# Patient Record
Sex: Female | Born: 1980 | Hispanic: Yes | Marital: Single
Health system: Western US, Academic
[De-identification: ages and names within clinical notes are randomized; demographics above are authoritative.]

## PROBLEM LIST (undated history)

## (undated) ENCOUNTER — Emergency Department (HOSPITAL_COMMUNITY): Admission: EM | Payer: BC Managed Care – PPO | Source: Home / Self Care

## (undated) DIAGNOSIS — E039 Hypothyroidism, unspecified: Secondary | ICD-10-CM

## (undated) DIAGNOSIS — E282 Polycystic ovarian syndrome: Secondary | ICD-10-CM

## (undated) DIAGNOSIS — K219 Gastro-esophageal reflux disease without esophagitis: Secondary | ICD-10-CM

## (undated) DIAGNOSIS — T7840XA Allergy, unspecified, initial encounter: Secondary | ICD-10-CM

## (undated) DIAGNOSIS — K760 Fatty (change of) liver, not elsewhere classified: Secondary | ICD-10-CM

## (undated) DIAGNOSIS — F32A Depression, unspecified: Secondary | ICD-10-CM

## (undated) DIAGNOSIS — D649 Anemia, unspecified: Secondary | ICD-10-CM

## (undated) DIAGNOSIS — F419 Anxiety disorder, unspecified: Secondary | ICD-10-CM

## (undated) DIAGNOSIS — N189 Chronic kidney disease, unspecified: Secondary | ICD-10-CM

## (undated) DIAGNOSIS — E669 Obesity, unspecified: Secondary | ICD-10-CM

## (undated) DIAGNOSIS — R519 Headache, unspecified: Secondary | ICD-10-CM

## (undated) DIAGNOSIS — N809 Endometriosis, unspecified: Secondary | ICD-10-CM

## (undated) DIAGNOSIS — I10 Essential (primary) hypertension: Secondary | ICD-10-CM

## (undated) DIAGNOSIS — E119 Type 2 diabetes mellitus without complications: Secondary | ICD-10-CM

## (undated) HISTORY — DX: Obesity, unspecified: E66.9

## (undated) HISTORY — PX: OTHER SURGICAL HISTORY: SHX169

## (undated) HISTORY — PX: LAPAROSCOPY: SHX197

## (undated) HISTORY — PX: COLON SURGERY: SHX602

## (undated) HISTORY — DX: Chronic kidney disease, unspecified: N18.9

## (undated) HISTORY — PX: NASAL SEPTUM SURGERY: SHX37

## (undated) HISTORY — DX: Allergy, unspecified, initial encounter: T78.40XA

## (undated) HISTORY — DX: Gastro-esophageal reflux disease without esophagitis: K21.9

## (undated) HISTORY — DX: Anemia, unspecified: D64.9

## (undated) HISTORY — DX: Fatty (change of) liver, not elsewhere classified: K76.0

## (undated) HISTORY — DX: Depression, unspecified: F32.A

## (undated) HISTORY — DX: Type 2 diabetes mellitus without complications: E11.9

## (undated) HISTORY — DX: Essential (primary) hypertension: I10

## (undated) HISTORY — DX: Polycystic ovarian syndrome: E28.2

---

## 2002-09-04 ENCOUNTER — Emergency Department: Admit: 2002-09-04 | Payer: Self-pay | Source: Emergency Department

## 2007-02-26 ENCOUNTER — Emergency Department: Admit: 2007-02-26 | Payer: Self-pay | Source: Emergency Department | Admitting: Emergency Medicine

## 2007-02-26 LAB — BASIC METABOLIC PANEL
BUN: 9 mg/dL (ref 8–20)
CO2: 26 mEq/L (ref 21–30)
Calcium: 8.7 mg/dL (ref 8.6–10.2)
Chloride: 107 mEq/L (ref 98–107)
Creatinine: 0.7 mg/dL (ref 0.6–1.5)
Glucose: 96 mg/dL (ref 70–100)
Potassium: 4 mEq/L (ref 3.6–5.0)
Sodium: 141 mEq/L (ref 136–146)

## 2007-02-26 LAB — GFR

## 2007-02-26 LAB — CBC WITH AUTO DIFFERENTIAL CERNER
Basophils Absolute: 0 /mm3 (ref 0.0–0.2)
Basophils: 1 % (ref 0–2)
Eosinophils Absolute: 0.1 /mm3 (ref 0.0–0.7)
Eosinophils: 2 % (ref 0–5)
Granulocytes Absolute: 3 /mm3 (ref 1.8–8.1)
Hematocrit: 32.5 % — ABNORMAL LOW (ref 37.0–47.0)
Hgb: 10.9 G/DL — ABNORMAL LOW (ref 12.0–16.0)
Lymphocytes Absolute: 3.6 /mm3 (ref 0.5–4.4)
Lymphocytes: 48 % — ABNORMAL HIGH (ref 15–41)
MCH: 28.2 PG (ref 28.0–32.0)
MCHC: 33.7 G/DL (ref 32.0–36.0)
MCV: 83.8 FL (ref 80.0–100.0)
MPV: 9.1 FL (ref 7.4–10.4)
Monocytes Absolute: 0.6 /mm3 (ref 0.0–1.2)
Monocytes: 9 % (ref 0–11)
Neutrophils %: 41 % — ABNORMAL LOW (ref 52–75)
Platelets: 234 /mm3 (ref 140–400)
RBC: 3.88 /mm3 — ABNORMAL LOW (ref 4.20–5.40)
RDW: 18.2 % — ABNORMAL HIGH (ref 11.5–15.0)
WBC: 7.4 /mm3 (ref 3.5–10.8)

## 2007-02-26 LAB — D-DIMER - SOFT: D-Dimer: 129 ng FEU

## 2007-07-22 DIAGNOSIS — K602 Anal fissure, unspecified: Secondary | ICD-10-CM

## 2007-07-22 HISTORY — DX: Anal fissure, unspecified: K60.2

## 2008-07-24 ENCOUNTER — Emergency Department: Admit: 2008-07-24 | Payer: Self-pay | Source: Emergency Department | Admitting: Emergency Medicine

## 2008-12-07 ENCOUNTER — Emergency Department: Admit: 2008-12-07 | Payer: Self-pay | Source: Emergency Department | Admitting: Emergency Medicine

## 2008-12-07 LAB — URINALYSIS
Bilirubin, UA: NEGATIVE
Blood, UA: NEGATIVE
Glucose, UA: NEGATIVE
Ketones UA: NEGATIVE
Leukocyte Esterase, UA: NEGATIVE
Nitrite, UA: NEGATIVE
Protein, UR: NEGATIVE
Specific Gravity UA POCT: <=1.005 (ref <=1.030)
Urine pH: 6 (ref 5.0–8.0)
Urobilinogen, UA: 0.2

## 2008-12-07 LAB — CBC AND DIFFERENTIAL
Basophils Absolute: 0 /mm3 (ref 0.0–0.2)
Basophils: 0 % (ref 0–2)
Eosinophils Absolute: 0.2 /mm3 (ref 0.0–0.7)
Eosinophils: 1 % (ref 0–5)
Granulocytes Absolute: 8 /mm3 (ref 1.8–8.1)
Hematocrit: 37.6 % (ref 37.0–47.0)
Hgb: 13.1 G/DL (ref 12.0–16.0)
Immature Granulocytes Absolute: 0 CUMM (ref 0.0–0.0)
Immature Granulocytes: 0 % (ref 0–1)
Lymphocytes Absolute: 4.4 /mm3 (ref 0.5–4.4)
Lymphocytes: 33 % (ref 15–41)
MCH: 29.8 PG (ref 28.0–32.0)
MCHC: 34.8 G/DL (ref 32.0–36.0)
MCV: 85.6 FL (ref 80.0–100.0)
MPV: 10.3 FL (ref 9.4–12.3)
Monocytes Absolute: 0.8 /mm3 (ref 0.0–1.2)
Monocytes: 6 % (ref 0–11)
Neutrophils %: 60 % (ref 52–75)
Platelets: 265 /mm3 (ref 140–400)
RBC: 4.39 /mm3 (ref 4.20–5.40)
RDW: 14.1 % (ref 11.5–15.0)
WBC: 13.32 /mm3 — ABNORMAL HIGH (ref 3.50–10.80)

## 2008-12-07 LAB — ACETAMINOPHEN LEVEL: Acetaminophen Level: <10 UG/ML

## 2008-12-07 LAB — COMPREHENSIVE METABOLIC PANEL
ALT: 35 U/L (ref 21–72)
AST (SGOT): 39 U/L (ref 8–39)
Albumin/Globulin Ratio: 1.3 (ref 1.1–1.8)
Albumin: 4.3 G/DL (ref 3.7–5.1)
Alkaline Phosphatase: 102 U/L (ref 43–122)
BUN: 13 MG/DL (ref 7–21)
Bilirubin, Total: <0.1 MG/DL (ref 0.2–1.3)
CO2: 27 MEQ/L (ref 22–31)
Calcium: 9.6 MG/DL (ref 8.6–10.2)
Chloride: 102 MEQ/L (ref 98–107)
Creatinine: 0.6 MG/DL (ref 0.5–1.4)
Globulin: 3.2 G/DL (ref 2.0–3.7)
Glucose: 94 MG/DL (ref 70–105)
Potassium: 4.1 MEQ/L (ref 3.6–5.0)
Protein, Total: 7.5 G/DL (ref 6.0–8.0)
Sodium: 139 MEQ/L (ref 136–143)

## 2008-12-07 LAB — RAPID DRUG SCREEN, URINE
Barbiturate Screen, UR: NEGATIVE
Benzodiazepine Screen, UR: NEGATIVE
Cocaine, UR: NEGATIVE
Opiate Screen, UR: NEGATIVE
PCP Screen, UR: NEGATIVE
THC Urine: NEGATIVE
Urine Amphetamine Screen: NEGATIVE

## 2008-12-07 LAB — GFR

## 2008-12-07 LAB — SALICYLATE LEVEL: Salicylate Level: <1 MG/DL

## 2008-12-07 LAB — URINE HCG QUALITATIVE: Urine HCG Qualitative: NEGATIVE

## 2008-12-07 LAB — ETHANOL

## 2008-12-07 LAB — CALCIUM IONIZED-CALC. CERNER: Calcium Ionized Calculated: 2 mEQ/L (ref 1.9–2.3)

## 2009-04-14 ENCOUNTER — Emergency Department: Admit: 2009-04-14 | Payer: Self-pay | Source: Emergency Department | Admitting: Emergency Medicine

## 2009-04-17 ENCOUNTER — Ambulatory Visit
Admit: 2009-04-17 | Disposition: A | Discharge status: Home / Self Care | Payer: Self-pay | Source: Ambulatory Visit | Admitting: Surgery

## 2009-04-19 ENCOUNTER — Ambulatory Visit: Admission: AD | Admit: 2009-04-19 | Payer: Self-pay | Source: Ambulatory Visit | Admitting: Surgery

## 2009-04-19 LAB — URINE HCG QUALITATIVE: Urine HCG Qualitative: NEGATIVE

## 2009-04-23 ENCOUNTER — Emergency Department: Admit: 2009-04-23 | Payer: Self-pay | Source: Emergency Department | Admitting: Emergency Medicine

## 2009-04-23 ENCOUNTER — Inpatient Hospital Stay
Admission: AD | Admit: 2009-04-23 | Disposition: A | Discharge status: Home / Self Care | Payer: Self-pay | Source: Ambulatory Visit | Admitting: Surgery

## 2009-04-23 LAB — BASIC METABOLIC PANEL
BUN: 5 MG/DL — ABNORMAL LOW (ref 7–21)
CO2: 23 MEQ/L (ref 22–31)
Calcium: 8.9 MG/DL (ref 8.6–10.2)
Chloride: 97 MEQ/L — ABNORMAL LOW (ref 98–107)
Creatinine: 0.6 mg/dL (ref 0.6–1.5)
Glucose: 111 MG/DL — ABNORMAL HIGH (ref 65–110)
Potassium: 3.7 MEQ/L (ref 3.6–5.0)
Sodium: 132 MEQ/L — ABNORMAL LOW (ref 136–143)

## 2009-04-23 LAB — COMPREHENSIVE METABOLIC PANEL
ALT: 66 U/L — ABNORMAL HIGH (ref 7–56)
AST (SGOT): 60 U/L — ABNORMAL HIGH (ref 5–40)
Albumin/Globulin Ratio: 1 — ABNORMAL LOW (ref 1.1–1.8)
Albumin: 2.6 G/DL — ABNORMAL LOW (ref 3.7–5.1)
Alkaline Phosphatase: 102 U/L (ref 43–122)
BUN: 5 MG/DL — ABNORMAL LOW (ref 7–21)
Bilirubin, Total: 0.7 MG/DL (ref 0.2–1.3)
CO2: 21 MEQ/L — ABNORMAL LOW (ref 22–31)
Calcium: 7.3 MG/DL — ABNORMAL LOW (ref 8.6–10.2)
Chloride: 102 MEQ/L (ref 98–107)
Creatinine: 0.6 MG/DL (ref 0.5–1.4)
Globulin: 2.5 G/DL (ref 2.0–3.7)
Glucose: 92 MG/DL (ref 65–110)
Potassium: 3.7 MEQ/L (ref 3.6–5.0)
Protein, Total: 5.1 G/DL — ABNORMAL LOW (ref 6.0–8.0)
Sodium: 132 MEQ/L — ABNORMAL LOW (ref 136–143)

## 2009-04-23 LAB — CBC AND DIFFERENTIAL
Basophils Absolute: 0 /mm3 (ref 0.0–0.2)
Basophils: 0 % (ref 0–2)
Eosinophils Absolute: 0 /mm3 (ref 0.0–0.7)
Eosinophils: 0 % (ref 0–5)
Granulocytes Absolute: 15 /mm3 — ABNORMAL HIGH (ref 1.8–8.1)
Hematocrit: 35.5 % — ABNORMAL LOW (ref 37.0–47.0)
Hematocrit: 29.5 % — ABNORMAL LOW (ref 37.0–47.0)
Hgb: 12.5 G/DL (ref 12.0–16.0)
Hgb: 10.3 G/DL — ABNORMAL LOW (ref 12.0–16.0)
Lymphocytes Absolute: 0.7 /mm3 (ref 0.5–4.4)
Lymphocytes: 4 % — ABNORMAL LOW (ref 15–41)
MCH: 30.7 PG (ref 28.0–32.0)
MCH: 29.6 PG (ref 28.0–32.0)
MCHC: 35.2 G/DL (ref 32.0–36.0)
MCHC: 34.9 G/DL (ref 32.0–36.0)
MCV: 87.2 FL (ref 80.0–100.0)
MCV: 84.8 FL (ref 80.0–100.0)
MPV: 10.5 FL (ref 9.4–12.3)
MPV: 10.9 FL (ref 9.4–12.3)
Monocytes Absolute: 1.1 /mm3 (ref 0.0–1.2)
Monocytes: 7 % (ref 0–11)
Neutrophils %: 89 % — ABNORMAL HIGH (ref 52–75)
Platelets: 216 /mm3 (ref 140–400)
Platelets: 181 /mm3 (ref 140–400)
RBC: 4.07 /mm3 — ABNORMAL LOW (ref 4.20–5.40)
RBC: 3.48 /mm3 — ABNORMAL LOW (ref 4.20–5.40)
RDW: 13.3 % (ref 11.5–15.0)
RDW: 13.2 % (ref 11.5–15.0)
WBC: 16.89 /mm3 — ABNORMAL HIGH (ref 3.50–10.80)
WBC: 17.45 /mm3 — ABNORMAL HIGH (ref 3.50–10.80)

## 2009-04-23 LAB — MAN DIFF ONLY
Band Neutrophils Absolute: 6.63
Bands: 38 % — ABNORMAL HIGH (ref 0–9)
Basophils %: 0 % (ref 0–2)
Basophils Absolute Manual: 0
Eosinophils %: 0 % (ref 0–5)
Eosinophils Absolute Manual: 0
Granulocytes #: 7.68
LYMPH#: 1.57
Lymphocytes Manual: 9 % — ABNORMAL LOW (ref 15–41)
Metamyelocyte Abs Ca: 0.7
Metamyelocytes: 4 % — ABNORMAL HIGH (ref 0–0)
Monocytes Absolute Calculated: 0.87
Monocytes Manual: 5 % (ref 0–8)
Neutrophils %: 44 % — ABNORMAL LOW (ref 52–75)

## 2009-04-23 LAB — MAGNESIUM: Magnesium: 1.9 MG/DL (ref 1.6–2.3)

## 2009-04-23 LAB — GFR

## 2009-04-23 LAB — CELL MORPHOLOGY
Platelet Estimate: NORMAL
RBC Morphology: ABNORMAL

## 2009-04-23 LAB — PT AND APTT
PT INR: 1.1 {INR} (ref 0.9–1.1)
PT: 13 s (ref 10.8–13.3)
PTT: 22 s (ref 21–32)

## 2009-04-24 LAB — VANCOMYCIN, TROUGH
Vancomycin Time of Last Dose: 800 HOURS
Vancomycin Trough: 4.3 UG/ML

## 2009-04-24 LAB — COMPREHENSIVE METABOLIC PANEL
ALT: 61 U/L — ABNORMAL HIGH (ref 7–56)
AST (SGOT): 53 U/L — ABNORMAL HIGH (ref 5–40)
Albumin/Globulin Ratio: 1 — ABNORMAL LOW (ref 1.1–1.8)
Albumin: 2.4 G/DL — ABNORMAL LOW (ref 3.7–5.1)
Alkaline Phosphatase: 99 U/L (ref 43–122)
BUN: 6 MG/DL — ABNORMAL LOW (ref 7–21)
Bilirubin, Total: 0.7 MG/DL (ref 0.2–1.3)
CO2: 21 MEQ/L — ABNORMAL LOW (ref 22–31)
Calcium: 7.3 MG/DL — ABNORMAL LOW (ref 8.6–10.2)
Chloride: 104 MEQ/L (ref 98–107)
Creatinine: 0.6 MG/DL (ref 0.5–1.4)
Globulin: 2.3 G/DL (ref 2.0–3.7)
Glucose: 108 MG/DL (ref 65–110)
Potassium: 4 MEQ/L (ref 3.6–5.0)
Protein, Total: 4.7 G/DL — ABNORMAL LOW (ref 6.0–8.0)
Sodium: 134 MEQ/L — ABNORMAL LOW (ref 136–143)

## 2009-04-24 LAB — CBC
Hematocrit: 28.4 % — ABNORMAL LOW (ref 37.0–47.0)
Hgb: 9.9 G/DL — ABNORMAL LOW (ref 12.0–16.0)
MCH: 29.6 PG (ref 28.0–32.0)
MCHC: 34.9 G/DL (ref 32.0–36.0)
MCV: 85 FL (ref 80.0–100.0)
MPV: 10.4 FL (ref 9.4–12.3)
Platelets: 186 /mm3 (ref 140–400)
RBC: 3.34 /mm3 — ABNORMAL LOW (ref 4.20–5.40)
RDW: 13.9 % (ref 11.5–15.0)
WBC: 18.85 /mm3 — ABNORMAL HIGH (ref 3.50–10.80)

## 2009-04-25 LAB — COMPREHENSIVE METABOLIC PANEL
ALT: 98 U/L — ABNORMAL HIGH (ref 7–56)
AST (SGOT): 118 U/L — ABNORMAL HIGH (ref 5–40)
Albumin/Globulin Ratio: 1 — ABNORMAL LOW (ref 1.1–1.8)
Albumin: 2.3 G/DL — ABNORMAL LOW (ref 3.7–5.1)
Alkaline Phosphatase: 138 U/L — ABNORMAL HIGH (ref 43–122)
BUN: 4 MG/DL — ABNORMAL LOW (ref 7–21)
Bilirubin, Total: 0.3 MG/DL (ref 0.2–1.3)
CO2: 26 MEQ/L (ref 22–31)
Calcium: 7.6 MG/DL — ABNORMAL LOW (ref 8.6–10.2)
Chloride: 108 MEQ/L — ABNORMAL HIGH (ref 98–107)
Creatinine: 0.6 MG/DL (ref 0.5–1.4)
Globulin: 2.3 G/DL (ref 2.0–3.7)
Glucose: 106 MG/DL (ref 65–110)
Potassium: 3.6 MEQ/L (ref 3.6–5.0)
Protein, Total: 4.6 G/DL — ABNORMAL LOW (ref 6.0–8.0)
Sodium: 139 MEQ/L (ref 136–143)

## 2009-04-25 LAB — CBC AND DIFFERENTIAL
Basophils Absolute: 0 /mm3 (ref 0.0–0.2)
Basophils: 0 % (ref 0–2)
Eosinophils Absolute: 0 /mm3 (ref 0.0–0.7)
Eosinophils: 0 % (ref 0–5)
Granulocytes Absolute: 13.5 /mm3 — ABNORMAL HIGH (ref 1.8–8.1)
Hematocrit: 28.8 % — ABNORMAL LOW (ref 37.0–47.0)
Hgb: 9.9 G/DL — ABNORMAL LOW (ref 12.0–16.0)
Immature Granulocytes Absolute: 0.1 CUMM — ABNORMAL HIGH (ref 0.0–0.0)
Immature Granulocytes: 0 % (ref 0–1)
Lymphocytes Absolute: 1.4 /mm3 (ref 0.5–4.4)
Lymphocytes: 9 % — ABNORMAL LOW (ref 15–41)
MCH: 29.3 PG (ref 28.0–32.0)
MCHC: 34.4 G/DL (ref 32.0–36.0)
MCV: 85.2 FL (ref 80.0–100.0)
MPV: 10.5 FL (ref 9.4–12.3)
Monocytes Absolute: 0.7 /mm3 (ref 0.0–1.2)
Monocytes: 4 % (ref 0–11)
Neutrophils %: 87 % — ABNORMAL HIGH (ref 52–75)
Platelets: 185 /mm3 (ref 140–400)
RBC: 3.38 /mm3 — ABNORMAL LOW (ref 4.20–5.40)
RDW: 14 % (ref 11.5–15.0)
WBC: 15.6 /mm3 — ABNORMAL HIGH (ref 3.50–10.80)

## 2009-04-25 LAB — GFR

## 2009-04-26 LAB — VANCOMYCIN, TROUGH
Vancomycin Time of Last Dose: 500 HOURS
Vancomycin Trough: 7.2 UG/ML

## 2009-04-26 LAB — MAGNESIUM: Magnesium: 2 MG/DL (ref 1.6–2.3)

## 2009-04-26 LAB — COMPREHENSIVE METABOLIC PANEL
ALT: 245 U/L — ABNORMAL HIGH (ref 7–56)
AST (SGOT): 303 U/L — ABNORMAL HIGH (ref 5–40)
Albumin/Globulin Ratio: 0.9 — ABNORMAL LOW (ref 1.1–1.8)
Albumin: 2 G/DL — ABNORMAL LOW (ref 3.7–5.1)
Alkaline Phosphatase: 252 U/L — ABNORMAL HIGH (ref 43–122)
BUN: 3 MG/DL — ABNORMAL LOW (ref 7–21)
Bilirubin, Total: 0.1 MG/DL — AB (ref 0.2–1.3)
CO2: 27 MEQ/L (ref 22–31)
Calcium: 7.4 MG/DL — ABNORMAL LOW (ref 8.6–10.2)
Chloride: 107 MEQ/L (ref 98–107)
Creatinine: 0.6 MG/DL (ref 0.5–1.4)
Globulin: 2.3 G/DL (ref 2.0–3.7)
Glucose: 73 MG/DL (ref 65–110)
Potassium: 3.3 MEQ/L — ABNORMAL LOW (ref 3.6–5.0)
Protein, Total: 4.3 G/DL — ABNORMAL LOW (ref 6.0–8.0)
Sodium: 139 MEQ/L (ref 136–143)

## 2009-04-26 LAB — CBC AND DIFFERENTIAL
Basophils Absolute: 0 /mm3 (ref 0.0–0.2)
Basophils: 0 % (ref 0–2)
Eosinophils Absolute: 0.1 /mm3 (ref 0.0–0.7)
Eosinophils: 1 % (ref 0–5)
Granulocytes Absolute: 9 /mm3 — ABNORMAL HIGH (ref 1.8–8.1)
Hematocrit: 26.6 % — ABNORMAL LOW (ref 37.0–47.0)
Hgb: 9.3 G/DL — ABNORMAL LOW (ref 12.0–16.0)
Immature Granulocytes Absolute: 0.1 CUMM — ABNORMAL HIGH (ref 0.0–0.0)
Immature Granulocytes: 0 % (ref 0–1)
Lymphocytes Absolute: 2.7 /mm3 (ref 0.5–4.4)
Lymphocytes: 21 % (ref 15–41)
MCH: 29.3 PG (ref 28.0–32.0)
MCHC: 35 G/DL (ref 32.0–36.0)
MCV: 83.9 FL (ref 80.0–100.0)
MPV: 10.4 FL (ref 9.4–12.3)
Monocytes Absolute: 0.7 /mm3 (ref 0.0–1.2)
Monocytes: 5 % (ref 0–11)
Neutrophils %: 72 % (ref 52–75)
Platelets: 209 /mm3 (ref 140–400)
RBC: 3.17 /mm3 — ABNORMAL LOW (ref 4.20–5.40)
RDW: 13.9 % (ref 11.5–15.0)
WBC: 12.45 /mm3 — ABNORMAL HIGH (ref 3.50–10.80)

## 2009-04-26 LAB — GFR

## 2009-04-27 LAB — COMPREHENSIVE METABOLIC PANEL
ALT: 235 U/L — ABNORMAL HIGH (ref 7–56)
AST (SGOT): 179 U/L — ABNORMAL HIGH (ref 5–40)
Albumin/Globulin Ratio: 0.9 — ABNORMAL LOW (ref 1.1–1.8)
Albumin: 2.2 G/DL — ABNORMAL LOW (ref 3.7–5.1)
Alkaline Phosphatase: 293 U/L — ABNORMAL HIGH (ref 43–122)
BUN: 3 MG/DL — ABNORMAL LOW (ref 7–21)
Bilirubin, Total: 0.2 MG/DL (ref 0.2–1.3)
CO2: 24 MEQ/L (ref 22–31)
Calcium: 8 MG/DL — ABNORMAL LOW (ref 8.6–10.2)
Chloride: 104 MEQ/L (ref 98–107)
Creatinine: 0.6 MG/DL (ref 0.5–1.4)
Globulin: 2.4 G/DL (ref 2.0–3.7)
Glucose: 76 MG/DL (ref 65–110)
Potassium: 3.4 MEQ/L — ABNORMAL LOW (ref 3.6–5.0)
Protein, Total: 4.6 G/DL — ABNORMAL LOW (ref 6.0–8.0)
Sodium: 137 MEQ/L (ref 136–143)

## 2009-04-27 LAB — CBC AND DIFFERENTIAL
Basophils Absolute: 0 /mm3 (ref 0.0–0.2)
Basophils: 0 % (ref 0–2)
Eosinophils Absolute: 0.1 /mm3 (ref 0.0–0.7)
Eosinophils: 1 % (ref 0–5)
Granulocytes Absolute: 7.9 /mm3 (ref 1.8–8.1)
Hematocrit: 26.8 % — ABNORMAL LOW (ref 37.0–47.0)
Hgb: 9.4 G/DL — ABNORMAL LOW (ref 12.0–16.0)
Immature Granulocytes Absolute: 0.1 CUMM — ABNORMAL HIGH (ref 0.0–0.0)
Immature Granulocytes: 1 % (ref 0–1)
Lymphocytes Absolute: 2.9 /mm3 (ref 0.5–4.4)
Lymphocytes: 24 % (ref 15–41)
MCH: 29.2 PG (ref 28.0–32.0)
MCHC: 35.1 G/DL (ref 32.0–36.0)
MCV: 83.2 FL (ref 80.0–100.0)
MPV: 10.3 FL (ref 9.4–12.3)
Monocytes Absolute: 1 /mm3 (ref 0.0–1.2)
Monocytes: 9 % (ref 0–11)
Neutrophils %: 66 % (ref 52–75)
Platelets: 240 /mm3 (ref 140–400)
RBC: 3.22 /mm3 — ABNORMAL LOW (ref 4.20–5.40)
RDW: 14 % (ref 11.5–15.0)
WBC: 12.03 /mm3 — ABNORMAL HIGH (ref 3.50–10.80)

## 2009-04-28 LAB — HEPATIC FUNCTION PANEL
ALT: 184 U/L — ABNORMAL HIGH (ref 7–56)
AST (SGOT): 111 U/L — ABNORMAL HIGH (ref 5–40)
Albumin/Globulin Ratio: 1 — ABNORMAL LOW (ref 1.1–1.8)
Albumin: 2.4 G/DL — ABNORMAL LOW (ref 3.7–5.1)
Alkaline Phosphatase: 359 U/L — ABNORMAL HIGH (ref 43–122)
Bilirubin Direct: 0.2 MG/DL (ref 0.0–0.3)
Bilirubin Indirect: 0.1 MG/DL (ref 0.0–1.1)
Bilirubin, Total: 0.3 MG/DL (ref 0.2–1.3)
Globulin: 2.5 G/DL (ref 2.0–3.7)
Protein, Total: 4.9 G/DL — ABNORMAL LOW (ref 6.0–8.0)

## 2009-04-28 LAB — CBC AND DIFFERENTIAL
Basophils Absolute: 0 /mm3 (ref 0.0–0.2)
Basophils: 0 % (ref 0–2)
Eosinophils Absolute: 0.2 /mm3 (ref 0.0–0.7)
Eosinophils: 2 % (ref 0–5)
Granulocytes Absolute: 6.1 /mm3 (ref 1.8–8.1)
Hematocrit: 26.7 % — ABNORMAL LOW (ref 37.0–47.0)
Hgb: 9.5 G/DL — ABNORMAL LOW (ref 12.0–16.0)
Immature Granulocytes Absolute: 0.4 CUMM — ABNORMAL HIGH (ref 0.0–0.0)
Immature Granulocytes: 4 % — ABNORMAL HIGH (ref 0–1)
Lymphocytes Absolute: 2.1 /mm3 (ref 0.5–4.4)
Lymphocytes: 22 % (ref 15–41)
MCH: 29.4 PG (ref 28.0–32.0)
MCHC: 35.6 G/DL (ref 32.0–36.0)
MCV: 82.7 FL (ref 80.0–100.0)
MPV: 10.1 FL (ref 9.4–12.3)
Monocytes Absolute: 1.3 /mm3 — ABNORMAL HIGH (ref 0.0–1.2)
Monocytes: 13 % — ABNORMAL HIGH (ref 0–11)
Neutrophils %: 63 % (ref 52–75)
Platelets: 276 /mm3 (ref 140–400)
RBC: 3.23 /mm3 — ABNORMAL LOW (ref 4.20–5.40)
RDW: 14.1 % (ref 11.5–15.0)
WBC: 9.61 /mm3 (ref 3.50–10.80)

## 2009-04-29 LAB — CELL MORPHOLOGY
Platelet Estimate: NORMAL
RBC Morphology: ABNORMAL

## 2009-04-29 LAB — CBC AND DIFFERENTIAL
Hematocrit: 25.7 % — ABNORMAL LOW (ref 37.0–47.0)
Hgb: 9 G/DL — ABNORMAL LOW (ref 12.0–16.0)
MCH: 28.8 PG (ref 28.0–32.0)
MCHC: 35 G/DL (ref 32.0–36.0)
MCV: 82.4 FL (ref 80.0–100.0)
MPV: 10.2 FL (ref 9.4–12.3)
Platelets: 295 /mm3 (ref 140–400)
RBC: 3.12 /mm3 — ABNORMAL LOW (ref 4.20–5.40)
RDW: 14.3 % (ref 11.5–15.0)
WBC: 10.39 /mm3 (ref 3.50–10.80)

## 2009-04-29 LAB — MAN DIFF ONLY
Band Neutrophils Absolute: 1.77
Bands: 17 % — ABNORMAL HIGH (ref 0–9)
Basophils %: 0 % (ref 0–2)
Basophils Absolute Manual: 0
Eosinophils %: 0 % (ref 0–5)
Eosinophils Absolute Manual: 0
Granulocytes #: 6.34
LYMPH#: 1.77
Lymphocytes Manual: 17 % (ref 15–41)
Monocytes Absolute Calculated: 0.52
Monocytes Manual: 5 % (ref 0–8)
Neutrophils %: 61 % (ref 52–75)

## 2009-04-29 LAB — COMPREHENSIVE METABOLIC PANEL
ALT: 135 U/L — ABNORMAL HIGH (ref 7–56)
AST (SGOT): 67 U/L — ABNORMAL HIGH (ref 5–40)
Albumin/Globulin Ratio: 0.9 — ABNORMAL LOW (ref 1.1–1.8)
Albumin: 2.2 G/DL — ABNORMAL LOW (ref 3.7–5.1)
Alkaline Phosphatase: 319 U/L — ABNORMAL HIGH (ref 43–122)
BUN: 4 MG/DL — ABNORMAL LOW (ref 7–21)
Bilirubin, Total: 0.1 MG/DL — AB (ref 0.2–1.3)
CO2: 27 MEQ/L (ref 22–31)
Calcium: 8 MG/DL — ABNORMAL LOW (ref 8.6–10.2)
Chloride: 106 MEQ/L (ref 98–107)
Creatinine: 0.6 MG/DL (ref 0.5–1.4)
Globulin: 2.4 G/DL (ref 2.0–3.7)
Glucose: 88 MG/DL (ref 65–110)
Potassium: 3.7 MEQ/L (ref 3.6–5.0)
Protein, Total: 4.6 G/DL — ABNORMAL LOW (ref 6.0–8.0)
Sodium: 141 MEQ/L (ref 136–143)

## 2009-04-29 LAB — GFR

## 2009-07-26 ENCOUNTER — Inpatient Hospital Stay
Admission: AD | Admit: 2009-07-26 | Disposition: A | Discharge status: Home / Self Care | Payer: Self-pay | Source: Ambulatory Visit | Admitting: Surgery

## 2009-07-26 LAB — COMPREHENSIVE METABOLIC PANEL
ALT: 54 U/L — ABNORMAL HIGH (ref 3–36)
AST (SGOT): 42 U/L — ABNORMAL HIGH (ref 10–41)
Albumin/Globulin Ratio: 1.3 (ref 1.1–1.8)
Albumin: 4.1 g/dL (ref 3.4–4.9)
Alkaline Phosphatase: 119 U/L — ABNORMAL HIGH (ref 43–112)
BUN: 10 mg/dL (ref 8–20)
Bilirubin, Total: <0.1 mg/dL (ref 0.1–1.0)
CO2: 25 mEq/L (ref 21–30)
Calcium: 8.8 mg/dL (ref 8.6–10.2)
Chloride: 104 mEq/L (ref 98–107)
Creatinine: 0.6 mg/dL (ref 0.6–1.5)
Globulin: 3.1 g/dL (ref 2.0–3.7)
Glucose: 86 mg/dL (ref 70–100)
Potassium: 3.9 mEq/L (ref 3.6–5.0)
Protein, Total: 7.2 g/dL (ref 6.0–8.0)
Sodium: 138 mEq/L (ref 136–146)

## 2009-07-26 LAB — CBC
Hematocrit: 35.3 % — ABNORMAL LOW (ref 37.0–47.0)
Hgb: 11.8 g/dL — ABNORMAL LOW (ref 12.0–16.0)
MCH: 28.9 pg (ref 28.0–32.0)
MCHC: 33.4 g/dL (ref 32.0–36.0)
MCV: 86.5 fL (ref 80.0–100.0)
MPV: 10.9 fL (ref 9.4–12.3)
Platelets: 274 10*3/uL (ref 140–400)
RBC: 4.08 10*6/uL — ABNORMAL LOW (ref 4.20–5.40)
RDW: 14 % (ref 12–15)
WBC: 12.2 10*3/uL — ABNORMAL HIGH (ref 3.50–10.80)

## 2009-07-26 LAB — URINALYSIS, REFLEX TO MICROSCOPIC EXAM IF INDICATED
Bilirubin, UA: NEGATIVE
Blood, UA: NEGATIVE
Glucose, UA: NEGATIVE
Leukocyte Esterase, UA: NEGATIVE
Nitrite, UA: NEGATIVE
Protein, UR: NEGATIVE
RBC, UA: 2 /HPF (ref 0–5)
Specific Gravity UA POCT: 1.015 (ref 1.001–1.035)
Squamous Epithelial Cells, Urine: 1 /HPF (ref 0–25)
Urine pH: 6.5 (ref 5.0–8.0)
Urobilinogen, UA: NORMAL mg/dL
WBC, UA: 1 /HPF (ref 0–5)

## 2009-07-26 LAB — GFR: EGFR: >60

## 2009-07-26 LAB — HCG QUANTITATIVE: hCG, Quant.: 0 m[IU]/mL

## 2009-07-28 LAB — CBC
Hematocrit: 31.7 % — ABNORMAL LOW (ref 37.0–47.0)
Hgb: 10.4 g/dL — ABNORMAL LOW (ref 12.0–16.0)
MCH: 28.8 pg (ref 28.0–32.0)
MCHC: 32.8 g/dL (ref 32.0–36.0)
MCV: 87.8 fL (ref 80.0–100.0)
MPV: 11.1 fL (ref 9.4–12.3)
Platelets: 255 10*3/uL (ref 140–400)
RBC: 3.61 10*6/uL — ABNORMAL LOW (ref 4.20–5.40)
RDW: 14 % (ref 12–15)
WBC: 7.3 10*3/uL (ref 3.50–10.80)

## 2009-07-28 LAB — BASIC METABOLIC PANEL
BUN: 4 mg/dL — ABNORMAL LOW (ref 8–20)
CO2: 23 mEq/L (ref 21–30)
Calcium: 8.4 mg/dL — ABNORMAL LOW (ref 8.6–10.2)
Chloride: 108 mEq/L — ABNORMAL HIGH (ref 98–107)
Creatinine: 0.6 mg/dL (ref 0.6–1.5)
Glucose: 86 mg/dL (ref 70–100)
Potassium: 3.8 mEq/L (ref 3.6–5.0)
Sodium: 138 mEq/L (ref 136–146)

## 2009-07-28 LAB — MAGNESIUM: Magnesium: 1.9 mg/dL (ref 1.6–2.3)

## 2009-07-28 LAB — GFR: EGFR: >60

## 2009-07-28 LAB — PHOSPHORUS: Phosphorus: 3.9 mg/dL (ref 2.5–4.5)

## 2009-08-02 ENCOUNTER — Ambulatory Visit
Admit: 2009-08-02 | Disposition: A | Discharge status: Home / Self Care | Payer: Self-pay | Source: Ambulatory Visit | Admitting: Specialist

## 2009-08-04 ENCOUNTER — Ambulatory Visit
Admit: 2009-08-04 | Disposition: A | Discharge status: Home / Self Care | Payer: Self-pay | Source: Ambulatory Visit | Admitting: Specialist

## 2009-08-04 LAB — CBC
Hematocrit: 37.8 % (ref 37.0–47.0)
Hgb: 12.5 g/dL (ref 12.0–16.0)
MCH: 29.1 pg (ref 28.0–32.0)
MCHC: 33.1 g/dL (ref 32.0–36.0)
MCV: 88.1 fL (ref 80.0–100.0)
MPV: 11.9 fL (ref 9.4–12.3)
Platelets: 330 10*3/uL (ref 140–400)
RBC: 4.29 10*6/uL (ref 4.20–5.40)
RDW: 14 % (ref 12–15)
WBC: 7.09 10*3/uL (ref 3.50–10.80)

## 2011-04-13 LAB — ECG 12-LEAD
Atrial Rate: 85 {beats}/min
P Axis: 62 degrees
P-R Interval: 124 ms
Q-T Interval: 346 ms
QRS Duration: 72 ms
QTC Calculation (Bezet): 411 ms
R Axis: 40 degrees
T Axis: 32 degrees
Ventricular Rate: 85 {beats}/min

## 2011-04-16 LAB — ECG 12-LEAD
Atrial Rate: 115 {beats}/min
P Axis: 73 degrees
P-R Interval: 114 ms
Q-T Interval: 300 ms
QRS Duration: 70 ms
QTC Calculation (Bezet): 415 ms
R Axis: 40 degrees
T Axis: 17 degrees
Ventricular Rate: 115 {beats}/min

## 2011-05-09 NOTE — Op Note (Signed)
Account Number: [id number]      Document ID: [id number]      Admit Date: 04/19/2009      Procedure Date: 04/19/2009            Patient Location: DISCHARGED 04/19/2009      Patient Type: A            SURGEON: Shari Prows MD      ASSISTANT:                  PREOPERATIVE DIAGNOSIS:      Fissure in ano.            POSTOPERATIVE DIAGNOSIS:      Fissure in ano.            OPERATION:      1.  Lateral anal sphincterotomy.      2.  Fissurectomy.      3.  Proctosigmoidoscopy, _____24.            ANESTHESIA:      General anesthesia.            INDICATIONS:      The patient is a 30 year old female who has a fissure in ano and a history      of constipation and bleeding per rectum.  The patient underwent extensive      conservative management with stool softener, local medicated ointments and      continued to have increasing pain in the perianal region.  Physical      examination showed a posterior anal fissure.  The patient was scheduled for      lateral anal sphincterotomy and possible fissurectomy under anesthesia.      Procedure, risks, and complications were explained.            FINDINGS:      There was presence of a posterior anal fissure proctosigmoidoscopy for a      distance of 30 cm was done and distal sigmoid colon and rectum appeared      normal.  There was presence of small internal hemorrhoids at the 3 and 7      o'clock position with a posterior anal fissure.  No other masses or lesions      were seen.            DESCRIPTION OF PROCEDURE:      The patient was laid supine on the operating table after induction of      general anesthesia, patient was placed in lithotomy position.  Local      anesthesia infiltrated in the perianal tissues using 0.25% Marcaine and      lidocaine with epinephrine.  Digital evaluation and proctosigmoidoscopy was      done using a rigid sigmoidoscope and advanced to a distance of 35 cm      without difficulty.  Inspection of the colorectal mucosa was done as the      scope was withdrawn  after insufflated it with air.  The incision was made      in the mucocutaneous junction with the help of Bovie cautery and the      internal sphincter was divided using Bovie cautery with the help of      hemostat forceps, the defect was felt intraluminally and the internal      sphincter and direct pressure was applied for hemostasis.  The defect in      the mucosa was closed with the help of interrupted 3-0 chromic suture.  The  fissure was cauterized and excised and oversewn with the help of continuous      3-0 Vicryl suture.  Hemostasis was assured.  Gelfoam dressing was applied.      There was no complication.  Estimated blood loss was minimal.  The patient      was transferred to the recovery room in stable condition, given      postoperative instructions including continuation of _____, plenty of oral      fluids, stool softener, and AnaMantle application locally.                        Electronic Signing Provider      _______________________________     Date/Time Signed: _____________      Shari Prows MD ([id number])            D:  04/19/2009 14:59 PM by Dr. Shari Prows, MD ([id number])      T:  04/19/2009 21:23 PM by [id number]          Everlean Cherry: [id number]) (Doc ID: [id number])                  VH:QION Ridgely Anastacio MD

## 2011-05-09 NOTE — Op Note (Signed)
Account Number: [id number]      Document ID: [id number]      Admit Date: 04/23/2009      Procedure Date: 04/23/2009            Patient Location: [id number]      Patient Type: I            SURGEON: Raihana Balderrama J Raymie Trani MD      ASSISTANT:                  PREOPERATIVE DIAGNOSES:      1.  Attention to recent right lateral sphincterotomy.      2.  Fever.      3.  Leukocytosis.      4.  Pelvic sepsis with right anal and gluteal abscess, hematoma and soft      tissue infection.            POSTOPERATIVE DIAGNOSES:      1.  Attention to recent right lateral sphincterotomy.      2.  Fever.      3.  Leukocytosis.      4.  Pelvic sepsis with right anal and gluteal abscess, hematoma and soft      tissue infection.            TITLE OF PROCEDURE:      1.  Examination under anesthesia anorectal area.      2.  Drainage of the hematoma and abscess and some feculent material from      the abscess cavity.      3.  Pudendal nerve block.            ANESTHESIA:      GETA.            POSITION:      Prone position.            DRAINAGE:      Quarter-inch Penrose drainage catheter placed on the wound.            COMPLICATIONS:      None.            CONDITION:      Stable.            INDICATIONS FOR SURGERY:      The patient is a very pleasant 30 year old woman who recently had right      lateral sphincterotomy full chronic fissures by one of the local surgeons.      This was done sometime 4 days ago and came back to the hospital with severe      excruciating pain, and persistent nausea and vomiting, fever and rigors.      The pain was excruciating.  Patient is not able to move hip or legs on the      right side and unable to mobilize.  The patient has progressively gotten      worse with fever spikes and rigors.  Patient initially went to the hospital      at nearby at [facility] in the healthcare center.  At that      time, the patient was worked up.  Patient looked toxic.  Patient did have a      high fever and leukocytosis.  At which point, I  was contacted from the ER      physician at that institution for possibly accepting this patient.  I have      agreed.  The patient was transferred to [facility]  [facility]ospital.  When the      patient got to the hospital, the patient had already fluid boluses.  The      patient did receive a dose of IV Zosyn and the patient went to the CT scan      right after.  CT scan suggests multiple gas pockets, air-fluid levels with      a large area of inflammatory changes noted right gluteal area.  The patient      did have leukocytosis with white blood cell count of 16,000, fever spikes      to 101.4 F.  When I examined the patient, the patient looked toxic;      however, patient's mental status is intact.  The patient's vitals include a      heart rate of 120s, blood pressures 90s systolic.  Patient did receive      multiple fluid boluses while the patient was being prepared for surgery.      The patient and family discussed the CT scan findings and further      management plans were made including surgery, which they have agreed to      clean the infections.  We have discussed extensively, the indications,      risks, complications, benefits about the surgery.  They have agreed and      consent form was obtained.  Again, patient received 2 additional liters of      fluid bolus preoperatively and I also started triple IV antibiotics      including clindamycin, vancomycin and IV Zosyn.            DESCRIPTION OF PROCEDURE:      The patient was brought into the OR table comfortably positioned on her bed      and general anesthesia was initiated.  Endotracheal tube was placed without      any problems.  Then patient was positioned is a prone position with      appropriate padding on all 4 extremities.  Patient then was appropriately      flexed exposing genitalia and gluteal area and patient's buttock was      prepped in usual sterile technique using Betadine paint.            Again, upon examination, the entire right gluteal  tissue was ecchymotic and      cellulitic.  There is pus and foul-smelling old hematoma that was draining      from the old incision at the right lateral sphincterotomy site and 3-0      chromic suture was intact.  This was cut and removed.  As soon as the wound      was opened further large quantity amount of the fluid was draining.  When I      probed this opened wound, the wound itself was big enough to place my index      finger. I was able to assess abscess cavity that was noted on CT scan,      quite large and extensive.  The inside the cavity tissue was very necrotic,      friable.  At this point, I was not comfortable draining and leaving only the      small hole      for draining site for the infection.  There was also a small fecal stream      noted mixed with hematoma.            Because of gross contamination,  I decided to make a separate incision at      distant location to connect the infected cavity and connecting the initial      open wound site.  I made a cruciate incision about 3 x 2 cm in dimension      and separate wound at the right gluteal dome.  I was able to drain further      infected fluid through this new skin incision.  Copious amounts of      irrigation solution were used to clean the infected cavity.            Once this wound was cleaned, it did not require any debridement.  I did not      see any major hemorrhages.  I then placed a quarter-inch Penrose drainage      catheter, connecting the distal open wounds.  Then, the cavity itself was      filled with iodoform packing strips.  Then, appropriate sterile dressing      was applied.            At this point, the patient then returned to normal supine position and was      able to be extubated without any problems.  At the end of the case, all the      sponge count and instrument counts correct.  Then the patient was      transferred to recovery area in stable condition.                        Electronic Signing Provider       _______________________________     Date/Time Signed: _____________      Alfredo Bach Falicia Lizotte MD ([id number])            D:  04/24/2009 14:43 PM by Dr. Tennis Ship, MD ([id number])      T:  04/25/2009 07:18 AM by [id number]          Everlean Cherry: [id number]) (Doc ID: [id number])                  MW:UXLK Abad Manard MD

## 2011-05-09 NOTE — Discharge Summary (Signed)
Account Number: [id number]      Document ID: [id number]      Admit Date: 04/23/2009      Discharge Date: 05/01/2009            ATTENDING PHYSICIAN:  Renee Rival, MD                  ADMISSION DIAGNOSES:      1.  recent right lateral sphincterotomy.      2.  Pelvic sepsis.      3.  Fever.      4.  Leukocytosis.            DISCHARGE DIAGNOSES:      1.  attention to recent right lateral sphincterotomy.      2.  Pelvic sepsis.      3.  Fever.      4.  Leukocytosis.            PROCEDURE:      1.  Incision and drainage and wound exploration performed on April 23, 2009,      2.  Reexploration of the wound of severe right pelvic sepsis on April 26, 2009.            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  The patient underwent      this procedure by a local surgeon the prior week.  The patient apparently      having pain and fever and unable to move for about 3 days of duration.  The      patient came to [facility] for this problem.            HOSPITAL COURSE:      I admitted the patient.  Upon examination the patient had quite large      swelling cellulitis and abscess and ecchymosis noted and on the clinic      examination the entire right gluteal buttock was involved in this wound.  I      was concerned at this point of postoperative pelvic sepsis.  The patient      did have CT scan which showed impressive findings on this area.  The      patient immediately underwent wound exploration and drainage of this      hematoma and the contaminated hematoma.  The wound was opened widely with 2      separate incisions through which I was able to put a Penrose drain for      further postoperative drainage.  Postoperatively, the patient did receive      IV antibiotics including vancomycin, clindamycin, and Zosyn.  The  patient      also had daily wound care dressing done with Sitz baths and  wound care      nurse also followed her for watching the wound.  About postoperative day      #3, the patient developed the fever spikes and more redness and drainage      from this wound.  The patient was taken back to surgery again for second      wound exploration.  At this point, there was extensive fat necrosis and      abscess and pus.  There was further drainage.  At this time, we removed the  Penrose and I made separate incision of the right gluteal more superiorly      at this time where the extensive fat necrotic tissue was noted.  These 2      large cavities was connected.  A large Penrose drain was applied.  From      initial hospital admissions, until the patient was discharged home.  Dr.      Irean Lamont Glasscock was involved for her postoperative wound care.  The appropriate      transition was made from Dr. Dorathy Kinsman service to my service.  After second      surgery, the patient improved significantly.  The pain was well under      control.  The patient did not have any  more fever spikes.  The patient had      wound care dressing change 3 times a day.  Before the patient was      discharged the wound did improve significantly.  The patient did not have      fever for 48 hours.            DISCHARGE INSTRUCTIONS:      The patient was discharged home with the following instructions.      1.  Diet:  Regular diet.      2.  Activity as tolerated.      3.  The patient was given antibiotics and pain medications to take home.      4.  The patient to follow up with me in 1 week.      4.  Wound care instructions given to patient with outpatient visiting nurse      associate to supervise wound care as well.  Mother was taught about the      wound care dressing changes at home, and also encouraged to do Sitz baths      daily.      5.  I have instructed the patient if she notices any worsening pain,      further drainage, or high fever, then she needs to call me for further      evaluation.  If these happen probably  recurrence of infection, and I did      explain to the family extensively and details about this            DISCHARGE CONDITION:      Excellent.            DISPOSITION:      Home with a family member.                        Electronic Signing Provider      _______________________________     Date/Time Signed: _____________      Alfredo Bach Karry Causer MD ([id number])            D:  05/10/2009 16:10 PM by Dr. Tennis Ship, MD ([id number])      T:  05/11/2009 11:02 AM by [id number]          Everlean Cherry: [id number]) (Doc ID: [id number])                        UU:VOZD Nyeshia Mysliwiec MD

## 2011-05-09 NOTE — Discharge Summary (Unsigned)
IRVIN, LIZAMA      MRN:          [id number]      Account:      [id number]      Document ID:  [id number]                  Admit Date: 07/26/2009      Discharge Date: 07/28/2009            ATTENDING PHYSICIAN:  Audley Hose Louna Rothgeb, MD                  ADMISSION DIAGNOSES:      1.  Leukocytosis.      2.  Perirectal abscess.            DISCHARGE DIAGNOSES:      1.  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      2.  The patient has a history of pelvic sepsis.            BRIEF HOSPITAL COURSE:      The patient is a very pleasant 30 year old woman who underwent elective      sphincterotomy for rectal pain anal fissures by local surgeon sometime      about three months ago, developed a pelvic sepsis, at which time I      encountered her and underwent multiple debridements and incision and drain      of this pelvic sepsis around the perianal area.  The patient's wound has      been healing expectedly, however, since last couple weeks, the patient also      noticed increasing drainage and pain.  The wound has not completely healed      over the last 2-1/2 months.  Yesterday the patient did still complain of      chills and worsening pain at which point the patient was admitted to the      hospital.  The patient did have leukocytosis, white count  12,000.  CT scan      of the pelvis suggested there is some significant induration, however, no      obvious fluid collection was noted in the perianal area and right gluteal      area, at which point the patient has requested specialty with a colorectal      surgeon at which time I did contact him and they have kindly came and      evaluated the patient.  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  The decision was made and MRI will be performed as      an outpatient and since patient has no fevers, leukocytosis improved.  The      patient will be discharged home with followup with the colorectal surgeon      as an outpatient and further definitive  management plans will be discussed      with the patient.            DISCHARGE INSTRUCTIONS:      1.  Diet:  Regular diet.      2.  Activity:  Ad lib.      3.  Wound care instructions including packing changes every day in the      right gluteal and perianal drainage sites as instructed and four Sitz baths      daily.      4.  The patient was given additional antibiotics.  5.  The patient will follow up with the colorectal surgeon as an outpatient      at which time definitive plans will be made, management plans were made                                   Page 1 of 2      LIBBI, TOWNER      MRN:          [id number]      Account:      [id number]      Document ID:  [id number]                  including MRI.            DISCHARGE CONDITION:      Excellent.            DISPOSITION:      Home with a family member.                        Electronic Signing Provider            D:  08/07/2009 09:09 AM by Dr. Tennis Ship, MD ([id number])      T:  08/07/2009 09:53 AM by Minda Meo                        XN:ATFT Felipe Cabell MD                                   Page 2 of 2      Authenticated by Tennis Ship, MD On 08/29/2009 03:20:42 PM

## 2011-05-09 NOTE — Op Note (Signed)
Account Number: [id number]      Document ID: [id number]      Admit Date: 04/23/2009      Procedure Date: 04/26/2009            Patient Location: DISCHARGED 05/01/2009      Patient Type: I            SURGEON: Alfredo Bach Aleea Hendry MD      ASSISTANT:                  PREOPERATIVE DIAGNOSES:      1.  Severe pelvic sepsis with abscess right gluteal area.      2.  Attention to previous wound exploration and I and D of the right      gluteal abscess and hematoma.            POSTOPERATIVE DIAGNOSIS:            TITLE OF PROCEDURE:      Second examination of the right gluteal buttock wound infection.            ASSOCIATE SURGEON:      Dr. Irean Jamyron Redd.            ESTIMATED BLOOD LOSS:      Minimal.            ANESTHESIA:      GETA.            FLUIDS:      See anesthesia report.            COMPLICATIONS:      None.            INDICATIONS FOR SURGERY:      The patient is a 31 year old woman that developed pelvic sepsis and severe      infection, hematoma, right gluteal area after right lateral sphincterotomy      was done sometime early in October, this month.  Patient developed this      problem and then underwent initially wound exploration on April 23, 2009.      Postoperatively, the patient did well initially.  However, the patient      developed fever spikes, worsening redness and erythema on the right gluteal      area.  The patient was explained to about the situation.  The patient may      have undrained abscess, recurrent infection, requiring further wound      debridements.  The patient did agree for this part of surgery after      discussing the indications, risks and benefits about the procedure.  This      patient has signed the consent form for second-time wound exploration.            DESCRIPTION OF PROCEDURE:      The patient was brought to the OR table on prone position was placed and after      patient had general anesthesia endotracheal tube placed without any      difficulty.  Perianal, right gluteal and left gluteal area was  prepped in      usual sterile technique.  All towels and drapes put in the usual fashion.            The patient had a large wound from about 4 cm from the anal verge in the      right gluteal buttock.  This was extended further to open the wound.  The      abscess wound cavity was quite  large and has quite extensive fat necrosis      and abscess noted.  This was thoroughly cleaned with the purulence      suctioned.  The wound was also irrigated extensively.  The cavity extending      all the way to the mid upper margin of the gluteal muscles; however, did      not penetrate the fascial layer.            I made a small collar incision at the upper margin of gluteus on the right      side.  Because the abscess cavity was quite extensive I was concerned that      it may not drain adequately.  Therefore, the incision was made to place a      large Penrose drain.  A half-inch Penrose drain was placed after these 2      tracts were connected through this large abscess cavity.            Once the wound was thoroughly cleaned and debrided, the wound was packed      with Kerlix packing dressing after wet-to-dry was performed.  Also, wound      was cleaned out.  Appropriate sterile dressing was applied.  The patient      returned to normal supine position, awakened, extubated in the OR, and      transferred to the recovery area in stable condition.  At the end of the      case, all the sponge counts, instrument counts were correct.                        Electronic Signing Provider      _______________________________     Date/Time Signed: _____________      Alfredo Bach Holten Spano MD ([id number])            D:  05/10/2009 16:23 PM by Dr. Tennis Ship, MD ([id number])      T:  05/11/2009 07:11 AM by [id number]          Everlean Cherry: [id number]) (Doc ID: [id number])                  VO:ZDGU Lorenna Lurry MD

## 2012-12-18 ENCOUNTER — Emergency Department: Payer: BC Managed Care – PPO

## 2012-12-18 ENCOUNTER — Emergency Department
Admission: EM | Admit: 2012-12-18 | Discharge: 2012-12-18 | Disposition: A | Discharge status: Home / Self Care | Payer: BC Managed Care – PPO | Attending: Emergency Medicine | Admitting: Emergency Medicine

## 2012-12-18 DIAGNOSIS — L299 Pruritus, unspecified: Secondary | ICD-10-CM | POA: Insufficient documentation

## 2012-12-18 DIAGNOSIS — F411 Generalized anxiety disorder: Secondary | ICD-10-CM | POA: Insufficient documentation

## 2012-12-18 HISTORY — DX: Anxiety disorder, unspecified: F41.9

## 2012-12-18 NOTE — ED Notes (Signed)
Pt reports she has seasonal allergies, takes Allegra but with no relief.  Pt reports painful itching in extremities that keeps her from sleeping.

## 2012-12-18 NOTE — Discharge Instructions (Signed)
Return to ER immediately for shortness of breath, worsening symptoms or new concerns.   You can purchase hydrocortisone cream and sunscreen and apply to areas.   You can take 1-2 Benadryl at night as needed.   Avoid sun and going outside for next couple days.   Wear gloves if needed to reduce scratching.   Please follow up with Dr. Alto Denver next week.     Pruritis, Non-Specific    You have been seen for itching. Another word for itching is pruritis.    Pruritis is an unpleasant feeling that causes an urge to scratch. It can be uncomfortable and annoying. Severe pruritis can cause sleep problems. There are many causes including allergies, eczema and dry skin. It can also happen if you come in contact with an irritating chemical. Certain medicines, like prescription pain medications, can cause itching. Some people are more likely to suffer from itching. This includes the elderly and pregnant patients. It also includes patients with diabetes, thyroid problems, kidney problems, HIV and cancer.     Sometimes, if a rash is also seen with the itching, it can help to find the cause. Some rashes that are usually easy to recognize are poison ivy, hives and psoriasis. Sometimes, the cause of the itching can be hard to figure out, even if there is a rash.    We don't know the cause of your itching, but it does not seem to be from something dangerous. It is safe for you to go home today.     Use mild, unscented soaps for bathing. Be sure to rinse off completely. Shower or bathe in lukewarm water and avoid water that is very hot. Use an unscented moisturizing lotion after bathing and several times per day.    See your primary care doctor to help figure out why you are itching.    YOU SHOULD SEEK MEDICAL ATTENTION IMMEDIATELY, EITHER HERE OR AT THE NEAREST EMERGENCY DEPARTMENT IF ANY OF THE FOLLOWING OCCUR:   You have shortness of breath, difficulty breathing or wheezing.   Your lips, tongue or throat get swollen.    You  have fevers (a temperature greater than 100.28F (38C).   You vomit (throw up) repeatedly or get more ill.

## 2012-12-18 NOTE — ED Provider Notes (Signed)
EMERGENCY DEPARTMENT HISTORY AND PHYSICAL EXAM    Date: 12/18/2012  Patient Name: Elaine Sharp  Attending Physician:  Kelly Splinter, MD  Diagnosis and Treatment Plan       Clinical Impression:   1. Skin pruritus        Treatment Plan:   ED Disposition     Discharge Elaine Sharp discharge to home/self care.    Condition at discharge:Good            History of Presenting Illness     Chief Complaint   Patient presents with   . Pruritis       History Provided By: Patient  Chief Complaint: Pruritis     Additional History: Elaine Sharp is a 32 y.o. female who presents to ED due to skin pruritis to her arms, legs, nose, ears and throat x 3-4 days. Pt also notes associated sneezing, and  congestion.  Pt noticed onset of sxs after taking Allegra for her seasonal allergy. Pt sts that the itchiness is gradually getting worse and especially at night/outside. Pt has been outside lately.     Denies any fever or SOB.     PCP: Hortencia Conradi, MD      No current facility-administered medications for this encounter.  Current outpatient prescriptions:buPROPion XL (WELLBUTRIN XL) 150 MG 24 hr tablet, , Disp: , Rfl: ;  busPIRone (BUSPAR) 15 MG tablet, , Disp: , Rfl: ;  escitalopram (LEXAPRO) 10 MG tablet, , Disp: , Rfl: ;  LUNESTA 1 MG tablet, , Disp: , Rfl:     Past Medical History     Past Medical History   Diagnosis Date   . Anxiety      No past surgical history on file.    Family History     No family history on file.    Social History     History     Social History   . Marital Status: Single     Spouse Name: N/A     Number of Children: N/A   . Years of Education: N/A     Social History Main Topics   . Smoking status: Not on file   . Smokeless tobacco: Not on file   . Alcohol Use:    . Drug Use:    . Sexually Active: Not on file     Other Topics Concern   . Not on file     Social History Narrative   . No narrative on file       Allergies     No Known Allergies    Review of Systems     Review of Systems   Constitutional:  Negative for fever.   HENT: Positive for congestion, sore throat and sneezing.    Eyes: Negative for discharge.   Respiratory: Negative for shortness of breath.    Skin:        Positive for skin pruritis    Psychiatric/Behavioral: Negative for suicidal ideas.         Physical Exam     BP 123/75  Pulse 74  Temp 98 F (36.7 C)  Resp 18  Ht 1.6 m  Wt 78.472 kg  BMI 30.65 kg/m2  SpO2 97%  LMP 12/11/2012  Pulse Oximetry Analysis - Normal 97% On RA    Physical Exam   Nursing note and vitals reviewed.  Constitutional: She is oriented to person, place, and time. She appears well-developed and well-nourished.   HENT:   Head: Normocephalic  and atraumatic.        mmm   Eyes: Conjunctivae normal are normal. No scleral icterus.   Neck: Normal range of motion. Neck supple.   Cardiovascular: Normal rate, regular rhythm and intact distal pulses.    Pulmonary/Chest: Effort normal. No stridor. No respiratory distress. She has no wheezes. She has no rales. She exhibits no tenderness.   Abdominal: Soft. She exhibits no distension.   Musculoskeletal: Normal range of motion. She exhibits no edema.   Neurological: She is alert and oriented to person, place, and time.   Skin: Skin is warm and dry. Rash noted.        Bilateral arms with dorsal erythema c/w sun exposure with scattered blanching papules and a few spots of excoriation of these similar papules, scattered non-confluent maculopapular rash ble, patient scratching there occasionally   Psychiatric: She has a normal mood and affect. Her behavior is normal. Judgment and thought content normal.         Diagnostic Study Results     Labs -     Results     ** No Results found for the last 24 hours. **          Radiologic Studies -   Radiology Results (24 Hour)     ** No Results found for the last 24 hours. **      .    Doctor's Notes     Throughout the stay in the Emergency Department, questions and concerns surrounding pain control, care plans, diagnostic studies, effects of  medications administered or prescribed, and future prognostic dilemmas were assessed and addressed.    ROS addendum: The patient and/or family was asked if they had any other complaints or concerns that we could address today and nothing of significance was noted.     IMP & PLAN: trial continued anti-histamines, careful sunscreen, 1% hydrocortisone cream, nasonex and self-care to avoid scratching, patient happy with this, no indication for systemic steroids, no infections, no systemic illness  7:02 PM -  Discussed with pt about d/c instructions. Pt is amenable to outpatient f/u. Instructed pt to follow up with PCP, Dr. Alto Denver next week. Pt expressed understanding, feels better, and agrees with plan. Pt counseled on return precautions. All questions/concerns address at this time. Pt is stable and ready for discharge.    _______________________________  Medical DeMedical Decision Makingcision Making  Attestations:     Physician/Midlevel provider first contact with patient: 12/18/12 1847         This note is prepared for Kelly Splinter, MD. The scribe's documentation has been prepared under my direction and personally reviewed by me in its entirety.  I confirm that the note above accurately reflects all work, treatment, procedures, and medical decision making performed by me.     I am the first provider for this patient.      Kelly Splinter, MD is the primary emergency doctor of record.      I reviewed the vital signs, available nursing notes, past medical history, past surgical history, family history and social history.    _______________________________                Westley Foots, MD  12/18/12 2119

## 2015-03-08 DIAGNOSIS — G4700 Insomnia, unspecified: Secondary | ICD-10-CM | POA: Insufficient documentation

## 2015-03-08 DIAGNOSIS — F3342 Major depressive disorder, recurrent, in full remission: Secondary | ICD-10-CM | POA: Insufficient documentation

## 2015-03-08 DIAGNOSIS — E282 Polycystic ovarian syndrome: Secondary | ICD-10-CM | POA: Insufficient documentation

## 2015-03-08 DIAGNOSIS — F419 Anxiety disorder, unspecified: Secondary | ICD-10-CM | POA: Insufficient documentation

## 2015-03-08 DIAGNOSIS — I10 Essential (primary) hypertension: Secondary | ICD-10-CM | POA: Insufficient documentation

## 2015-04-30 ENCOUNTER — Ambulatory Visit (INDEPENDENT_AMBULATORY_CARE_PROVIDER_SITE_OTHER): Payer: No Typology Code available for payment source | Admitting: Family Medicine

## 2015-04-30 ENCOUNTER — Encounter (INDEPENDENT_AMBULATORY_CARE_PROVIDER_SITE_OTHER): Payer: Self-pay | Admitting: Family Medicine

## 2015-04-30 VITALS — BP 132/94 | HR 91 | Temp 98.5°F | Resp 18 | Ht 63.0 in | Wt 178.0 lb

## 2015-04-30 DIAGNOSIS — Z309 Encounter for contraceptive management, unspecified: Secondary | ICD-10-CM

## 2015-04-30 DIAGNOSIS — R519 Headache, unspecified: Secondary | ICD-10-CM

## 2015-04-30 DIAGNOSIS — Z3009 Encounter for other general counseling and advice on contraception: Secondary | ICD-10-CM

## 2015-04-30 DIAGNOSIS — R7301 Impaired fasting glucose: Secondary | ICD-10-CM

## 2015-04-30 DIAGNOSIS — E282 Polycystic ovarian syndrome: Secondary | ICD-10-CM

## 2015-04-30 DIAGNOSIS — R51 Headache: Secondary | ICD-10-CM

## 2015-04-30 LAB — HEMOLYSIS INDEX: Hemolysis Index: 2 (ref 0–18)

## 2015-04-30 LAB — HEMOGLOBIN A1C
Average Estimated Glucose: 116.9 mg/dL
Hemoglobin A1C: 5.7 % (ref 4.6–5.9)

## 2015-04-30 LAB — GFR: EGFR: >60

## 2015-04-30 LAB — BASIC METABOLIC PANEL
BUN: 12 mg/dL (ref 7.0–19.0)
CO2: 22 mEq/L (ref 21–30)
Calcium: 9.1 mg/dL (ref 8.5–10.5)
Chloride: 107 mEq/L (ref 100–111)
Creatinine: 0.7 mg/dL (ref 0.4–1.5)
Glucose: 91 mg/dL (ref 70–100)
Potassium: 4.2 mEq/L (ref 3.5–5.3)
Sodium: 140 mEq/L (ref 135–146)

## 2015-04-30 MED ORDER — BUPROPION HCL ER (XL) 300 MG PO TB24
300.0000 mg | ORAL_TABLET | Freq: Every day | ORAL | Status: AC
Start: 2015-04-30 — End: ?

## 2015-04-30 MED ORDER — ESCITALOPRAM OXALATE 10 MG PO TABS
10.0000 mg | ORAL_TABLET | Freq: Every day | ORAL | Status: AC
Start: 2015-04-30 — End: ?

## 2015-04-30 MED ORDER — DROSPIREN-ETH ESTRAD-LEVOMEFOL 3-0.02-0.451 MG PO TABS
ORAL_TABLET | ORAL | Status: DC
Start: 2015-04-30 — End: 2015-10-30

## 2015-04-30 NOTE — Progress Notes (Signed)
Subjective:     Patient ID:  Elaine Sharp is a 34 y.o. female.    HPI Comments:   34 yo female here today for headaches.  She is new to our office.  Has known PCOS.  On Metformin and OCPs.  Was doing well on Beyaz, but had to change because of insurance.  Was on 2 different OCPs.  Headaches started around change of OCP.    Headache   This is a new problem. The current episode started more than 1 month ago. The problem occurs intermittently. The problem has been unchanged. The pain is located in the bilateral region. The pain does not radiate. The pain quality is not similar to prior headaches. The quality of the pain is described as pulsating. Associated symptoms include nausea. Pertinent negatives include no anorexia, back pain, blurred vision, coughing, fever, phonophobia, photophobia, seizures, tingling or vomiting. The symptoms are aggravated by unknown (patient had recent change in oral contraceptive). She has tried acetaminophen for the symptoms. The treatment provided mild relief.       Current Outpatient Prescriptions on File Prior to Visit   Medication Sig Dispense Refill   . [DISCONTINUED] buPROPion XL (WELLBUTRIN XL) 150 MG 24 hr tablet 300 mg.        . [DISCONTINUED] busPIRone (BUSPAR) 15 MG tablet      . [DISCONTINUED] escitalopram (LEXAPRO) 10 MG tablet      . [DISCONTINUED] LUNESTA 1 MG tablet        No current facility-administered medications on file prior to visit.       The following portions of the patient's history were reviewed and updated as appropriate: allergies, current medications, past family history, past medical history, past social history, past surgical history and problem list.      Review of Systems   Constitutional: Negative for fever, chills and fatigue.   Eyes: Negative for blurred vision and photophobia.   Respiratory: Negative for cough, shortness of breath and wheezing.    Cardiovascular: Negative for chest pain and palpitations.   Gastrointestinal: Positive for nausea.  Negative for vomiting and anorexia.   Musculoskeletal: Negative for back pain and arthralgias.   Neurological: Positive for headaches. Negative for tingling and seizures.   All other systems reviewed and are negative.      BP 132/94 mmHg  Pulse 91  Temp(Src) 98.5 F (36.9 C)  Resp 18  Ht 1.6 m (5\' 3" )  Wt 80.74 kg (178 lb)  BMI 31.54 kg/m2  LMP 04/15/2015 (Exact Date)    Objective:   Physical Exam   Constitutional: She appears well-developed and well-nourished. No distress.   HENT:   Head: Normocephalic and atraumatic.   Right Ear: Hearing, tympanic membrane, external ear and ear canal normal.   Left Ear: Hearing, tympanic membrane, external ear and ear canal normal.   Nose: Nose normal. No mucosal edema.   Mouth/Throat: Oropharynx is clear and moist. No oropharyngeal exudate.   Eyes: Conjunctivae and EOM are normal. Pupils are equal, round, and reactive to light. Right eye exhibits no discharge. Left eye exhibits no discharge.   Cardiovascular: Normal rate, regular rhythm and normal heart sounds.  Exam reveals no gallop and no friction rub.    No murmur heard.  Pulmonary/Chest: Effort normal and breath sounds normal. No respiratory distress. She has no wheezes. She has no rales.   Musculoskeletal: Normal range of motion. She exhibits no edema or tenderness.   Neurological: She is alert. She has normal reflexes. No cranial  nerve deficit.   Appropriate finger to nose, rapid alternating hand movements, heel to shin tests bilaterally.     Nursing note and vitals reviewed.        Assessment:     1. Bilateral polycystic ovarian syndrome  Ambulatory referral to Endocrinology   2. Nonintractable headache, unspecified chronicity pattern, unspecified headache type     3. Encounter for counseling regarding contraception     4. Impaired fasting glucose  Basic metabolic panel    Hemoglobin A1C       Plan:     1.  Refer to endocrinology.  Continue with Meftormin.    2.3.  Probably secondary change in OCP.  Restart Beyaz  because she had no issues with this in the past.  Follow up in 6-8 weeks.    4.  Check labs as above.  Adjust Metformin as indicated.      Risk & benefits of any medication(s) given/prescribed were explained to the patient (and family) who verbalized understanding & agreed to the treatment plan.  Patient (and/or family) encouraged to contact me/clinical staff with any questions/concerns.      Procedures  :none

## 2015-05-01 ENCOUNTER — Telehealth (INDEPENDENT_AMBULATORY_CARE_PROVIDER_SITE_OTHER): Payer: Self-pay | Admitting: Family Medicine

## 2015-05-01 NOTE — Telephone Encounter (Signed)
Pt called requesting pre authorization for: Drospiren-Eth Estrad-Levomefol (BEYAZ) 3-0.02-0.451 MG Tab and refill on escitalopram (LEXAPRO) 10 MG tablet.

## 2015-05-01 NOTE — Telephone Encounter (Signed)
These medications were all filled yesterday 04/30/15, with 1 refill on each.  Please advise pt.    Thank you

## 2015-05-03 NOTE — Progress Notes (Signed)
Quick Note:    Please let patient know labs returned reassuring. Sugar levels are good at this time. Kidney function is normal as well. We can repeat these in about 6 months.  ______

## 2015-05-04 NOTE — Progress Notes (Signed)
Quick Note:    I received letter that Elaine Sharp is approved.  ______

## 2015-05-04 NOTE — Telephone Encounter (Signed)
Prior Authorization approved, please see below.    PA Case [id number] is Approved. For further questions, call [phone].    Pt informed of such.

## 2015-05-04 NOTE — Telephone Encounter (Signed)
My apology, pt actually wanted a PA for the BEYAZ, I assured her that this will be completed today and will call her back with outcome from insurance as soon as available.  Her call back number [phone].    I am working on this PA now.

## 2015-05-07 ENCOUNTER — Telehealth (INDEPENDENT_AMBULATORY_CARE_PROVIDER_SITE_OTHER): Payer: Self-pay | Admitting: Family Medicine

## 2015-05-07 DIAGNOSIS — E282 Polycystic ovarian syndrome: Secondary | ICD-10-CM

## 2015-05-07 LAB — TSH: TSH: 2.93 u[IU]/mL (ref 0.35–4.94)

## 2015-05-07 LAB — FOLLICLE STIMULATING HORMONE: Follicle Stimulating Hormone: 3.61

## 2015-05-07 LAB — LUTEINIZING HORMONE: LH: 4.7

## 2015-05-07 LAB — PROLACTIN: Prolactin: 10.3 ng/mL (ref 5.2–26.5)

## 2015-05-07 LAB — TESTOSTERONE: Testosterone: 50 ng/dL (ref 14–76)

## 2015-05-07 NOTE — Telephone Encounter (Signed)
Dr. Coralee Pesa please see message below.

## 2015-05-07 NOTE — Telephone Encounter (Signed)
Pt called requesting hormonal level order for lab, in order seeing Dr. Tanen/endocrinologist. Please check with Dr. Coralee Pesa. Ph # [phone].

## 2015-05-07 NOTE — Telephone Encounter (Signed)
I added labs - please let patient know.  These should be adequate for Dr. Everrett Coombe.  TSH, Testosterone, FSH, LH, DHEA-S

## 2015-05-08 LAB — DHEA-SULFATE: DHEA  Dehydroepiandrosterone Sulfate: 297 — ABNORMAL HIGH (ref 23–266)

## 2015-05-08 NOTE — Telephone Encounter (Signed)
Pt informed , and she has been scheduled for a lab visit.

## 2015-05-09 ENCOUNTER — Other Ambulatory Visit (FREE_STANDING_LABORATORY_FACILITY): Payer: No Typology Code available for payment source

## 2015-05-09 ENCOUNTER — Other Ambulatory Visit: Payer: No Typology Code available for payment source

## 2015-05-09 DIAGNOSIS — E282 Polycystic ovarian syndrome: Secondary | ICD-10-CM

## 2015-05-09 LAB — LUTEINIZING HORMONE: LH: 10.1

## 2015-05-09 LAB — TSH: TSH: 2.32 u[IU]/mL (ref 0.35–4.94)

## 2015-05-09 LAB — PROLACTIN: Prolactin: 10.1 ng/mL (ref 5.2–26.5)

## 2015-05-09 LAB — FOLLICLE STIMULATING HORMONE: Follicle Stimulating Hormone: 4.44

## 2015-05-09 LAB — TESTOSTERONE: Testosterone: 38 ng/dL (ref 14–76)

## 2015-05-10 ENCOUNTER — Encounter (INDEPENDENT_AMBULATORY_CARE_PROVIDER_SITE_OTHER): Payer: Self-pay

## 2015-05-10 LAB — DHEA-SULFATE: DHEA  Dehydroepiandrosterone Sulfate: 310 — ABNORMAL HIGH (ref 23–266)

## 2015-05-14 NOTE — Telephone Encounter (Signed)
Pt called to have her lab results sent to Dr. Blima Dessert office on [phone].  Pt advised will do so.  Results printed and faxed as indicated.

## 2015-05-29 ENCOUNTER — Other Ambulatory Visit (INDEPENDENT_AMBULATORY_CARE_PROVIDER_SITE_OTHER): Payer: Self-pay | Admitting: Family Medicine

## 2015-06-08 ENCOUNTER — Telehealth (INDEPENDENT_AMBULATORY_CARE_PROVIDER_SITE_OTHER): Payer: Self-pay | Admitting: Family Medicine

## 2015-06-08 NOTE — Telephone Encounter (Signed)
Pt said she went to endocrinologist ... They told her that there is nothing they can do ... Pt is concerned about her weight and is unhappy with the response from the specialist she saw today     CB# [phone]

## 2015-06-11 ENCOUNTER — Encounter (INDEPENDENT_AMBULATORY_CARE_PROVIDER_SITE_OTHER): Payer: Self-pay | Admitting: Family Medicine

## 2015-06-11 ENCOUNTER — Ambulatory Visit (INDEPENDENT_AMBULATORY_CARE_PROVIDER_SITE_OTHER): Payer: No Typology Code available for payment source | Admitting: Family Medicine

## 2015-06-11 VITALS — BP 138/89 | HR 94 | Temp 99.0°F | Resp 18 | Ht 62.0 in | Wt 180.2 lb

## 2015-06-11 DIAGNOSIS — E663 Overweight: Secondary | ICD-10-CM

## 2015-06-11 DIAGNOSIS — Z1331 Encounter for screening for depression: Secondary | ICD-10-CM

## 2015-06-11 DIAGNOSIS — E282 Polycystic ovarian syndrome: Secondary | ICD-10-CM

## 2015-06-11 DIAGNOSIS — Z1389 Encounter for screening for other disorder: Secondary | ICD-10-CM

## 2015-06-11 MED ORDER — METFORMIN HCL ER 750 MG PO TB24
750.0000 mg | ORAL_TABLET | Freq: Three times a day (TID) | ORAL | Status: DC
Start: 2015-06-11 — End: 2015-10-30

## 2015-06-11 NOTE — Progress Notes (Signed)
Subjective:     Patient ID:  Elaine Sharp is a 34 y.o. female.    HPI Comments:   34 year old female here today for follow-up of PCOS.  She is very concerned and little bit distraught because she saw endocrinology in there was not really much in terms of the treatment for her weight.  She feels that her weight gain despite her dietary efforts is making her more depressed than usual.  She does follow with psychiatry and is on 2 antidepressants.  She has not seen a dietitian formally in the past.      Current Outpatient Prescriptions on File Prior to Visit   Medication Sig Dispense Refill   . ALPRAZolam (XANAX) 0.25 MG tablet Take 0.25 mg by mouth nightly as needed.     Marland Kitchen buPROPion XL (WELLBUTRIN XL) 300 MG 24 hr tablet Take 1 tablet (300 mg total) by mouth daily. 90 tablet 1   . Drospiren-Eth Estrad-Levomefol (BEYAZ) 3-0.02-0.451 MG Tab 1 tablet daily 84 tablet 1   . escitalopram (LEXAPRO) 10 MG tablet Take 1 tablet (10 mg total) by mouth daily. 90 tablet 1   . hydrochlorothiazide (HYDRODIURIL) 12.5 MG tablet Take 12.5 mg by mouth daily.     . temazepam (RESTORIL) 22.5 MG capsule Take by mouth.     . [DISCONTINUED] metFORMIN (GLUCOPHAGE-XR) 750 MG 24 hr tablet Take 1,500 mg by mouth daily with dinner.       No current facility-administered medications on file prior to visit.       The following portions of the patient's history were reviewed and updated as appropriate: allergies, current medications, past medical history and problem list.      Review of Systems   Constitutional: Negative for fever, chills and fatigue.   Respiratory: Negative for cough, shortness of breath and wheezing.    Cardiovascular: Negative for chest pain and palpitations.   Psychiatric/Behavioral: Positive for dysphoric mood.       BP 138/89 mmHg  Pulse 94  Temp(Src) 99 F (37.2 C)  Resp 18  Ht 1.575 m (5\' 2" )  Wt 81.738 kg (180 lb 3.2 oz)  BMI 32.95 kg/m2  LMP 06/02/2015 (Exact Date)    Objective:   Physical Exam   Constitutional:  She appears well-developed and well-nourished. No distress.   Cardiovascular: Normal rate, regular rhythm and normal heart sounds.  Exam reveals no gallop and no friction rub.    No murmur heard.  Pulmonary/Chest: Effort normal and breath sounds normal. No respiratory distress. She has no wheezes. She has no rales.   Psychiatric: She exhibits a depressed mood.   Nursing note and vitals reviewed.        Assessment:     1. Bilateral polycystic ovarian syndrome  Ambulatory referral to Nutrition Services   2. Overweight  Ambulatory referral to Nutrition Services       Plan:     1.  Increase Metformin to TID.    2.  Refer to dietician.      Risk & benefits of any medication(s) given/prescribed were explained to the patient (and/or family) who verbalized understanding & agreed to the treatment plan.  Patient (and/or family) encouraged to contact me/clinical staff with any questions/concerns.      Procedures  :none

## 2015-06-11 NOTE — Telephone Encounter (Signed)
Spoke with Pt last week - had her schedule appointment to see Dr. Coralee Pesa as he suggested.

## 2015-06-19 ENCOUNTER — Encounter (INDEPENDENT_AMBULATORY_CARE_PROVIDER_SITE_OTHER): Payer: Self-pay | Admitting: Family Medicine

## 2015-06-19 ENCOUNTER — Ambulatory Visit (INDEPENDENT_AMBULATORY_CARE_PROVIDER_SITE_OTHER): Payer: No Typology Code available for payment source | Admitting: Family Medicine

## 2015-06-19 VITALS — BP 137/76 | HR 112 | Temp 97.6°F | Resp 18 | Wt 176.6 lb

## 2015-06-19 DIAGNOSIS — R21 Rash and other nonspecific skin eruption: Secondary | ICD-10-CM

## 2015-06-19 MED ORDER — HYDROCORTISONE 2.5 % EX CREA
TOPICAL_CREAM | Freq: Two times a day (BID) | CUTANEOUS | Status: DC
Start: 2015-06-19 — End: 2015-10-30

## 2015-06-19 MED ORDER — HYDROXYZINE HCL 25 MG PO TABS
25.0000 mg | ORAL_TABLET | Freq: Three times a day (TID) | ORAL | Status: DC | PRN
Start: 2015-06-19 — End: 2015-08-14

## 2015-06-19 NOTE — Progress Notes (Signed)
Subjective:     Patient ID:  Elaine Sharp is a 34 y.o. female.    Rash  This is a new problem. The current episode started in the past 7 days. The problem is unchanged. The affected locations include the neck and chest (upper chest/neck). The rash is characterized by dryness, itchiness and redness. Associated with: recently unpacked a scarf and used it last week; unwashed. Pertinent negatives include no congestion, cough, fatigue, fever, rhinorrhea, shortness of breath or sore throat. Past treatments include nothing. There is no history of asthma or eczema.       Current Outpatient Prescriptions on File Prior to Visit   Medication Sig Dispense Refill   . ALPRAZolam (XANAX) 0.25 MG tablet Take 0.25 mg by mouth nightly as needed.     Marland Kitchen buPROPion XL (WELLBUTRIN XL) 300 MG 24 hr tablet Take 1 tablet (300 mg total) by mouth daily. 90 tablet 1   . Drospiren-Eth Estrad-Levomefol (BEYAZ) 3-0.02-0.451 MG Tab 1 tablet daily 84 tablet 1   . escitalopram (LEXAPRO) 10 MG tablet Take 1 tablet (10 mg total) by mouth daily. 90 tablet 1   . hydrochlorothiazide (HYDRODIURIL) 12.5 MG tablet Take 12.5 mg by mouth daily.     . metFORMIN (GLUCOPHAGE-XR) 750 MG 24 hr tablet Take 1 tablet (750 mg total) by mouth 3 (three) times daily with meals. 270 tablet 1   . temazepam (RESTORIL) 22.5 MG capsule Take by mouth.       No current facility-administered medications on file prior to visit.       The following portions of the patient's history were reviewed and updated as appropriate: allergies, current medications, past medical history and problem list.      Review of Systems   Constitutional: Negative for fever and fatigue.   HENT: Negative for congestion, rhinorrhea and sore throat.    Respiratory: Negative for cough, shortness of breath and wheezing.    Cardiovascular: Negative for chest pain and palpitations.   Skin: Positive for rash.       BP 137/76 mmHg  Pulse 112  Temp(Src) 97.6 F (36.4 C) (Oral)  Wt 80.105 kg (176 lb 9.6 oz)   LMP 06/02/2015 (Exact Date)    Objective:   Physical Exam   Constitutional: She appears well-developed and well-nourished. No distress.   Cardiovascular: Normal rate.    Pulmonary/Chest: Effort normal.   Skin: Skin is warm and dry. Rash noted. No bruising, no ecchymosis and no laceration noted. Rash is maculopapular. There is erythema.        Nursing note and vitals reviewed.        Assessment:     1. Rash         Plan:     1.  Suspect contact dermatitis from scarf.  Trial of hydrocortisone cream 2.5% BID.  Also Rx Hydroxyzine for itching.  Lower suspicion for pityriasis rosea because of localized rash.  Though if it were pityriasis rosea, expect spontaneous resolution.      Risk & benefits of any medication(s) given/prescribed were explained to the patient (and/or family) who verbalized understanding & agreed to the treatment plan.  Patient (and/or family) encouraged to contact me/clinical staff with any questions/concerns.      Procedures  :none

## 2015-08-14 ENCOUNTER — Ambulatory Visit (INDEPENDENT_AMBULATORY_CARE_PROVIDER_SITE_OTHER): Payer: Commercial Managed Care - POS | Admitting: Family Medicine

## 2015-08-14 ENCOUNTER — Encounter (INDEPENDENT_AMBULATORY_CARE_PROVIDER_SITE_OTHER): Payer: Self-pay | Admitting: Family Medicine

## 2015-08-14 VITALS — BP 145/98 | HR 109 | Temp 98.4°F | Resp 18 | Ht 62.0 in | Wt 182.4 lb

## 2015-08-14 DIAGNOSIS — R51 Headache: Secondary | ICD-10-CM

## 2015-08-14 DIAGNOSIS — R519 Headache, unspecified: Secondary | ICD-10-CM

## 2015-08-14 NOTE — Progress Notes (Signed)
Subjective:     Patient ID:  Elaine Sharp is a 35 y.o. female.    Headache   This is a recurrent problem. The current episode started 1 to 4 weeks ago. The problem occurs intermittently. The problem has been unchanged. The pain is located in the bilateral region. The pain does not radiate. The quality of the pain is described as pulsating and aching. The pain is moderate. Associated symptoms include photophobia. Pertinent negatives include no ear pain, fever, hearing loss, loss of balance, muscle aches, nausea, phonophobia, scalp tenderness, tinnitus or vomiting. She has tried NSAIDs and acetaminophen for the symptoms.       Current Outpatient Prescriptions on File Prior to Visit   Medication Sig Dispense Refill   . ALPRAZolam (XANAX) 0.25 MG tablet Take 0.25 mg by mouth nightly as needed.     Marland Kitchen buPROPion XL (WELLBUTRIN XL) 300 MG 24 hr tablet Take 1 tablet (300 mg total) by mouth daily. 90 tablet 1   . Drospiren-Eth Estrad-Levomefol (BEYAZ) 3-0.02-0.451 MG Tab 1 tablet daily 84 tablet 1   . escitalopram (LEXAPRO) 10 MG tablet Take 1 tablet (10 mg total) by mouth daily. 90 tablet 1   . hydrochlorothiazide (HYDRODIURIL) 12.5 MG tablet Take 12.5 mg by mouth daily.     . hydrocortisone 2.5 % cream Apply topically 2 (two) times daily. 30 g 0   . metFORMIN (GLUCOPHAGE-XR) 750 MG 24 hr tablet Take 1 tablet (750 mg total) by mouth 3 (three) times daily with meals. 270 tablet 1   . hydrOXYzine (ATARAX) 25 MG tablet Take 1 tablet (25 mg total) by mouth 3 (three) times daily as needed for Itching. 30 tablet 1   . temazepam (RESTORIL) 22.5 MG capsule Take by mouth.       No current facility-administered medications on file prior to visit.       The following portions of the patient's history were reviewed and updated as appropriate: allergies, current medications, past medical history and problem list.      Review of Systems   Constitutional: Negative for fever.   HENT: Negative for ear pain, hearing loss and tinnitus.     Eyes: Positive for photophobia.   Gastrointestinal: Negative for nausea and vomiting.   Neurological: Positive for headaches. Negative for loss of balance.       BP 145/98 mmHg  Pulse 109  Temp(Src) 98.4 F (36.9 C)  Resp 18  Ht 1.575 m (5\' 2" )  Wt 82.736 kg (182 lb 6.4 oz)  BMI 33.35 kg/m2  LMP 07/29/2015 (Exact Date)    Objective:   Physical Exam   Constitutional: She appears well-developed and well-nourished. No distress.   Cardiovascular: Normal rate, regular rhythm and normal heart sounds.  Exam reveals no gallop and no friction rub.    No murmur heard.  Pulmonary/Chest: Effort normal and breath sounds normal. No respiratory distress. She has no wheezes. She has no rales.   Neurological: She is alert. She has normal strength and normal reflexes. No cranial nerve deficit. She displays a negative Romberg sign. Coordination normal.   Reflex Scores:       Bicep reflexes are 2+ on the right side and 2+ on the left side.       Patellar reflexes are 2+ on the right side and 2+ on the left side.  Appropriate finger to nose, rapid alternating hand movements, heel to shin tests bilaterally.     Nursing note and vitals reviewed.  Assessment:     1. Acute nonintractable headache, unspecified headache type  CT Head W WO Contrast       Plan:     1.  Given worsening of headache and frequency, pursue head CT.  If normal, refer to headache clinic.  Consider weaning some of her medications that could lead to headache - Wellbutrin, Beyaz, Lexapro.      Risk & benefits of any medication(s) given/prescribed were explained to the patient (and/or family) who verbalized understanding & agreed to the treatment plan.  Patient (and/or family) encouraged to contact me/clinical staff with any questions/concerns.      Procedures  :none

## 2015-08-16 ENCOUNTER — Other Ambulatory Visit: Payer: Self-pay | Admitting: Family Medicine

## 2015-08-16 ENCOUNTER — Encounter (INDEPENDENT_AMBULATORY_CARE_PROVIDER_SITE_OTHER): Payer: Self-pay | Admitting: Family Medicine

## 2015-08-22 ENCOUNTER — Telehealth (INDEPENDENT_AMBULATORY_CARE_PROVIDER_SITE_OTHER): Payer: Self-pay | Admitting: Family Medicine

## 2015-08-22 NOTE — Telephone Encounter (Signed)
I do not see any recent CT scans. I will call pt to f/u on where they went.

## 2015-08-22 NOTE — Telephone Encounter (Signed)
Pt called requested CT scan results     - please call back when results are in / translated

## 2015-08-23 ENCOUNTER — Encounter (INDEPENDENT_AMBULATORY_CARE_PROVIDER_SITE_OTHER): Payer: Self-pay | Admitting: Family Medicine

## 2015-08-23 NOTE — Telephone Encounter (Signed)
Scanned results into chart. Also left v/m informing Pt of normal CT head result. Advised she c/b if any questions.

## 2015-08-30 ENCOUNTER — Encounter (INDEPENDENT_AMBULATORY_CARE_PROVIDER_SITE_OTHER): Payer: Self-pay

## 2015-09-27 ENCOUNTER — Encounter (INDEPENDENT_AMBULATORY_CARE_PROVIDER_SITE_OTHER): Payer: Self-pay

## 2015-10-30 ENCOUNTER — Encounter (INDEPENDENT_AMBULATORY_CARE_PROVIDER_SITE_OTHER): Payer: Self-pay | Admitting: Family Medicine

## 2015-10-30 ENCOUNTER — Ambulatory Visit (INDEPENDENT_AMBULATORY_CARE_PROVIDER_SITE_OTHER): Payer: Commercial Managed Care - POS | Admitting: Family Medicine

## 2015-10-30 DIAGNOSIS — Z0000 Encounter for general adult medical examination without abnormal findings: Secondary | ICD-10-CM

## 2015-10-30 LAB — COMPREHENSIVE METABOLIC PANEL
ALT: 62 U/L — ABNORMAL HIGH (ref 0–55)
AST (SGOT): 43 U/L — ABNORMAL HIGH (ref 5–34)
Albumin/Globulin Ratio: 1 (ref 0.9–2.2)
Albumin: 3.4 g/dL — ABNORMAL LOW (ref 3.5–5.0)
Alkaline Phosphatase: 87 U/L (ref 37–106)
BUN: 6 mg/dL — ABNORMAL LOW (ref 7.0–19.0)
Bilirubin, Total: 0.3 mg/dL (ref 0.1–1.2)
CO2: 23 mEq/L (ref 21–30)
Calcium: 9.1 mg/dL (ref 8.5–10.5)
Chloride: 106 mEq/L (ref 100–111)
Creatinine: 0.7 mg/dL (ref 0.4–1.5)
Globulin: 3.3 g/dL (ref 2.0–3.7)
Glucose: 90 mg/dL (ref 70–100)
Potassium: 3.9 mEq/L (ref 3.5–5.3)
Protein, Total: 6.7 g/dL (ref 6.0–8.3)
Sodium: 138 mEq/L (ref 135–146)

## 2015-10-30 LAB — CBC AND DIFFERENTIAL
Basophils Absolute Automated: 0.02 10*3/uL (ref 0.00–0.20)
Basophils Automated: 0 %
Eosinophils Absolute Automated: 0.14 10*3/uL (ref 0.00–0.70)
Eosinophils Automated: 2 %
Hematocrit: 40.8 % (ref 37.0–47.0)
Hgb: 13 g/dL (ref 12.0–16.0)
Immature Granulocytes Absolute: 0.01 10*3/uL
Immature Granulocytes: 0 %
Lymphocytes Absolute Automated: 3.77 10*3/uL (ref 0.50–4.40)
Lymphocytes Automated: 41 %
MCH: 29.6 pg (ref 28.0–32.0)
MCHC: 31.9 g/dL — ABNORMAL LOW (ref 32.0–36.0)
MCV: 92.9 fL (ref 80.0–100.0)
MPV: 11.2 fL (ref 9.4–12.3)
Monocytes Absolute Automated: 0.68 10*3/uL (ref 0.00–1.20)
Monocytes: 7 %
Neutrophils Absolute: 4.59 10*3/uL (ref 1.80–8.10)
Neutrophils: 50 %
Nucleated RBC: 0 /100 WBC (ref 0–1)
Platelets: 291 10*3/uL (ref 140–400)
RBC: 4.39 10*6/uL (ref 4.20–5.40)
RDW: 15 % (ref 12–15)
WBC: 9.21 10*3/uL (ref 3.50–10.80)

## 2015-10-30 LAB — LIPID PANEL
Cholesterol / HDL Ratio: 3.9
Cholesterol: 197 mg/dL (ref 0–199)
HDL: 51 mg/dL (ref 40–9999)
LDL Calculated: 123 mg/dL — ABNORMAL HIGH (ref 0–99)
Triglycerides: 115 mg/dL (ref 34–149)
VLDL Calculated: 23 mg/dL (ref 10–40)

## 2015-10-30 LAB — HEMOLYSIS INDEX: Hemolysis Index: 3 (ref 0–18)

## 2015-10-30 LAB — GFR: EGFR: >60

## 2015-10-30 NOTE — Progress Notes (Signed)
Subjective:     Patient ID:  Elaine Sharp is a 35 y.o. female.    HPI Comments:   35 y.o. female here today for annual examination.  The patient has weaned herself off of oral contraceptives and Metformin.  Her headaches are improved as a result.  Was recently started on Amitriptyline by her psychiatrist.    Eye exam: up to date  Dental exam: not up to date  Tetanus: up to date    Colon cancer screening: family history; personal history of polyps; most recent 08/2015  Breast cancer screening: not applicable due to age  Cervical cancer screening: up to date; 05/2015        Current Outpatient Prescriptions on File Prior to Visit   Medication Sig Dispense Refill   . ALPRAZolam (XANAX) 0.25 MG tablet Take 0.25 mg by mouth nightly as needed.     Marland Kitchen buPROPion XL (WELLBUTRIN XL) 300 MG 24 hr tablet Take 1 tablet (300 mg total) by mouth daily. 90 tablet 1   . escitalopram (LEXAPRO) 10 MG tablet Take 1 tablet (10 mg total) by mouth daily. 90 tablet 1   . hydrochlorothiazide (HYDRODIURIL) 12.5 MG tablet Take 12.5 mg by mouth daily.     . [DISCONTINUED] Drospiren-Eth Estrad-Levomefol (BEYAZ) 3-0.02-0.451 MG Tab 1 tablet daily 84 tablet 1   . [DISCONTINUED] hydrocortisone 2.5 % cream Apply topically 2 (two) times daily. 30 g 0   . [DISCONTINUED] metFORMIN (GLUCOPHAGE-XR) 750 MG 24 hr tablet Take 1 tablet (750 mg total) by mouth 3 (three) times daily with meals. 270 tablet 1     No current facility-administered medications on file prior to visit.       The following portions of the patient's history were reviewed and updated as appropriate: allergies, current medications, past family history, past medical history, past social history, past surgical history and problem list.      Review of Systems   Constitutional: Negative for fever, chills and fatigue.   HENT: Negative for congestion, ear pain, rhinorrhea, sinus pressure and sore throat.    Eyes: Negative for pain and itching.   Respiratory: Negative for cough, shortness of  breath and wheezing.    Cardiovascular: Negative for chest pain and palpitations.   Gastrointestinal: Negative for nausea, vomiting, abdominal pain, diarrhea and constipation.   Genitourinary: Negative for dysuria, urgency and frequency.   Musculoskeletal: Negative for back pain and arthralgias.   Neurological: Negative for dizziness, light-headedness and headaches.   Psychiatric/Behavioral: Negative for dysphoric mood and decreased concentration. The patient is not nervous/anxious.        BP 132/88 mmHg  Pulse 100  Temp(Src) 98.3 F (36.8 C)  Resp 18  Ht 1.6 m (5\' 3" )  Wt 81.194 kg (179 lb)  BMI 31.72 kg/m2  LMP 10/13/2015    Objective:   Physical Exam   Constitutional: She appears well-developed and well-nourished. No distress.   HENT:   Head: Normocephalic and atraumatic.   Right Ear: Hearing, tympanic membrane, external ear and ear canal normal.   Left Ear: Hearing, tympanic membrane, external ear and ear canal normal.   Nose: Nose normal. No mucosal edema.   Mouth/Throat: Oropharynx is clear and moist. No oropharyngeal exudate.   Eyes: Conjunctivae and EOM are normal. Pupils are equal, round, and reactive to light. Right eye exhibits no discharge. Left eye exhibits no discharge.   Neck: Normal range of motion.   Cardiovascular: Normal rate, regular rhythm and normal heart sounds.  Exam reveals no gallop  and no friction rub.    No murmur heard.  Pulmonary/Chest: Effort normal and breath sounds normal. No respiratory distress. She has no wheezes. She has no rales.   Abdominal: Soft. Bowel sounds are normal. She exhibits no distension. There is no tenderness. There is no rebound.   Musculoskeletal: Normal range of motion. She exhibits no edema or tenderness.   Lymphadenopathy:     She has no cervical adenopathy.   Neurological: She is alert. She has normal reflexes. No cranial nerve deficit.   Skin: Skin is warm and dry. No rash noted.   Psychiatric: She has a normal mood and affect. Her behavior is  normal.   Nursing note and vitals reviewed.        Assessment:     1. Routine general medical examination at a health care facility  CBC and differential    Comprehensive metabolic panel    Lipid panel       Plan:     1.  Well adult female.  Labs as above for screening purposes.  Immunizations up to date by history.    Immunization History   Administered Date(s) Administered   . Td 08/21/2014     Counseling/Anticipatory Guidance:  nutrition, family planning/contraception, physical activity, healthy weight, injury prevention, misuse of tobacco, alcohol and drugs, sexual behavior and STDs, dental health, mental health, immunizations, screenings, breast cancer and self breast exams.      Risk & benefits of any medication(s) given/prescribed were explained to the patient (and/or family) who verbalized understanding & agreed to the treatment plan.  Patient (and/or family) encouraged to contact me/clinical staff with any questions/concerns.      Procedures  :none

## 2015-11-07 NOTE — Progress Notes (Signed)
Quick Note:    Hi Elaine Sharp,    I have reviewed your labs and they are reassuring. I have no new medications to recommend. We can repeat these labs at your next physical.    Regards,    Antonieta Iba, MD  [facility] Medicine/Geriatrics    [facility] - Primary Care - [facility]  [address], [place], [place] [address]  P [phone] F [phone]      ______

## 2015-11-13 ENCOUNTER — Encounter (INDEPENDENT_AMBULATORY_CARE_PROVIDER_SITE_OTHER): Payer: Commercial Managed Care - POS | Admitting: Family Medicine

## 2015-11-13 NOTE — Telephone Encounter (Signed)
Pt requesting refill on HCTZ.

## 2015-11-15 MED ORDER — HYDROCHLOROTHIAZIDE 25 MG PO TABS
25.0000 mg | ORAL_TABLET | Freq: Every day | ORAL | Status: DC
Start: 2015-11-15 — End: 2016-02-12

## 2016-02-12 ENCOUNTER — Other Ambulatory Visit (INDEPENDENT_AMBULATORY_CARE_PROVIDER_SITE_OTHER): Payer: Self-pay | Admitting: Family Medicine

## 2016-02-12 ENCOUNTER — Encounter (INDEPENDENT_AMBULATORY_CARE_PROVIDER_SITE_OTHER): Payer: Self-pay | Admitting: Family Medicine

## 2016-02-12 MED ORDER — HYDROCHLOROTHIAZIDE 25 MG PO TABS
25.0000 mg | ORAL_TABLET | Freq: Every day | ORAL | Status: AC
Start: 2016-02-12 — End: ?

## 2016-02-12 NOTE — Telephone Encounter (Signed)
Called patient to ask which Pharmacy in [facility] we are sending the Rx Left a Voice Mail asking her to call with this information so we can send it to her.

## 2016-05-24 ENCOUNTER — Emergency Department (EMERGENCY_DEPARTMENT_HOSPITAL)
Admission: RE | Admit: 2016-05-24 | Discharge: 2016-05-24 | Disposition: A | Discharge status: Home / Self Care | Payer: Federal, State, Local not specified - PPO

## 2016-05-24 ENCOUNTER — Emergency Department
Admission: EM | Admit: 2016-05-24 | Discharge: 2016-05-24 | Disposition: A | Discharge status: Home / Self Care | Payer: Federal, State, Local not specified - PPO | Attending: Emergency Medicine | Admitting: Emergency Medicine

## 2016-05-24 DIAGNOSIS — N809 Endometriosis, unspecified: Secondary | ICD-10-CM | POA: Insufficient documentation

## 2016-05-24 DIAGNOSIS — E282 Polycystic ovarian syndrome: Secondary | ICD-10-CM | POA: Insufficient documentation

## 2016-05-24 DIAGNOSIS — F419 Anxiety disorder, unspecified: Secondary | ICD-10-CM | POA: Insufficient documentation

## 2016-05-24 DIAGNOSIS — N3001 Acute cystitis with hematuria: Secondary | ICD-10-CM | POA: Insufficient documentation

## 2016-05-24 DIAGNOSIS — B9689 Other specified bacterial agents as the cause of diseases classified elsewhere: Secondary | ICD-10-CM

## 2016-05-24 DIAGNOSIS — N760 Acute vaginitis: Principal | ICD-10-CM | POA: Insufficient documentation

## 2016-05-24 DIAGNOSIS — F329 Major depressive disorder, single episode, unspecified: Secondary | ICD-10-CM | POA: Insufficient documentation

## 2016-05-24 DIAGNOSIS — Z0000 Encounter for general adult medical examination without abnormal findings: Secondary | ICD-10-CM

## 2016-05-24 DIAGNOSIS — D261 Other benign neoplasm of corpus uteri: Secondary | ICD-10-CM

## 2016-05-24 DIAGNOSIS — R102 Pelvic and perineal pain: Secondary | ICD-10-CM

## 2016-05-24 DIAGNOSIS — N8311 Corpus luteum cyst of right ovary: Secondary | ICD-10-CM | POA: Insufficient documentation

## 2016-05-24 DIAGNOSIS — Z3202 Encounter for pregnancy test, result negative: Secondary | ICD-10-CM

## 2016-05-24 LAB — CBC WITH DIFF, BLOOD
ANC-Automated: 7.7 10*3/uL — ABNORMAL HIGH (ref 1.6–7.0)
Abs Eosinophils: 0.2 10*3/uL (ref 0.1–0.5)
Abs Lymphs: 3.5 10*3/uL — ABNORMAL HIGH (ref 0.8–3.1)
Abs Monos: 0.9 10*3/uL — ABNORMAL HIGH (ref 0.2–0.8)
Eosinophils: 1 %
Hct: 39.6 % (ref 34.0–45.0)
Hgb: 13.3 gm/dL (ref 11.2–15.7)
Lymphocytes: 29 %
MCH: 29.1 pg (ref 26.0–32.0)
MCHC: 33.6 g/dL (ref 32.0–36.0)
MCV: 86.7 um3 (ref 79.0–95.0)
MPV: 11.9 fL (ref 9.4–12.4)
Monocytes: 7 %
Plt Count: 242 10*3/uL (ref 140–370)
RBC: 4.57 10*6/uL (ref 3.90–5.20)
RDW: 15 % — ABNORMAL HIGH (ref 12.0–14.0)
Segs: 63 %
WBC: 12.4 10*3/uL — ABNORMAL HIGH (ref 4.0–10.0)

## 2016-05-24 LAB — BASIC METABOLIC PANEL, BLOOD
Anion Gap: 16 mmol/L — ABNORMAL HIGH (ref 7–15)
BUN: 7 mg/dL (ref 6–20)
Bicarbonate: 25 mmol/L (ref 22–29)
Calcium: 9.2 mg/dL (ref 8.5–10.6)
Chloride: 97 mmol/L — ABNORMAL LOW (ref 98–107)
Creatinine: 0.64 mg/dL (ref 0.51–0.95)
GFR: >60 mL/min
Glucose: 93 mg/dL (ref 70–99)
Potassium: 3.4 mmol/L — ABNORMAL LOW (ref 3.5–5.1)
Sodium: 138 mmol/L (ref 136–145)

## 2016-05-24 LAB — URINALYSIS WITH CULTURE REFLEX, WHEN INDICATED
Bilirubin: NEGATIVE
Glucose: NEGATIVE
Ketones: NEGATIVE
Nitrite: NEGATIVE
RBC: >50 — AB (ref 0–?)
Specific Gravity: 1.016 (ref 1.002–1.030)
Urobilinogen: NEGATIVE
WBC: >50 — AB (ref 0–?)
pH: 6 (ref 5.0–8.0)

## 2016-05-24 LAB — VAGINOSIS SCREEN
Candida, Vaginal Screen: NEGATIVE
Gardnerella, Vaginal Screen: POSITIVE — AB
Trichomonas, Vaginal Screen: NEGATIVE mL

## 2016-05-24 LAB — HIV 1/2 ANTIBODY & P24 ANTIGEN ASSAY, BLOOD: HIV 1/2 Antibody & P24 Antigen Assay: NONREACTIVE

## 2016-05-24 LAB — C-REACTIVE PROTEIN, BLOOD: CRP: 0.8 mg/dL — ABNORMAL HIGH (ref <0.5)

## 2016-05-24 MED ORDER — METRONIDAZOLE 500 MG OR TABS
500.00 mg | ORAL_TABLET | Freq: Once | ORAL | Status: AC
Start: 2016-05-24 — End: 2016-05-24
  Administered 2016-05-24: 500 mg via ORAL
  Filled 2016-05-24: qty 1

## 2016-05-24 MED ORDER — METRONIDAZOLE 500 MG OR TABS
500.00 mg | ORAL_TABLET | Freq: Two times a day (BID) | ORAL | 0 refills | Status: AC
Start: 2016-05-24 — End: ?

## 2016-05-24 MED ORDER — KETOROLAC TROMETHAMINE 30 MG/ML IJ SOLN
15.00 mg | Freq: Once | INTRAMUSCULAR | Status: AC
Start: 2016-05-24 — End: 2016-05-24
  Administered 2016-05-24: 15 mg via INTRAMUSCULAR
  Filled 2016-05-24: qty 1

## 2016-05-24 NOTE — ED Provider Notes (Signed)
Emergency Department Provider Note    Patient: Anne Valencia, MRN [id number], DOB [birth year]  The Date of Service for the Emergency Room encounter is 05/24/2016  4:40 AM   Chief Complaint   Patient presents with   . Abdominal Pain     rlq pain x2 days , denies n/v/d/f/c, not currently c menses, per pt she saw her PMD yesterday who thought she had a  possible ruptured cyst, unable to get done, pain worse now today       HPI: Anne Valencia is a 35 year old female with PMHx significant for PCOS, endometriosis s/p laparoscopy 2011, depression and anxiety, p/w 3 days of RLQ pain now encompassing lower abdomen, 8 out of 10 cramping pain, worse with sitting and walking, now today with dysuria and increased urinary frequency. Denies nausea/vomiting/fevers. Last BM was last night and at her baseline Denies vaginal bleeding/discharge/dyspareunia. Denies hematuria/flank pain. LMP was Oct 11th (3 weeks ago); periods are regular, not on birth control. Patient took 600mg  ibuprofen once with mild improvement, but pain caused her to not sleep this evening which is what brought her into ED. See additional ROS below.     Denies convulsions/LOC  Denies headache/changes in vision/changes in hearing/dysphagia/odynophagia  Denies SOB/dyspnea/chest pain/palpitations/cough  Denies constipation  Denies numbness/tingling/swelling of extremities  Denies new rash  Denies recent travel/sick contacts  Denies drug use    ROS:  All other systems reviewed and negative unless otherwise noted in the HPI or above. This was done per my custom and practice for systems appropriate to the chief complaint in an emergency department setting and varies depending on the quality of history that the patient is able to provide.    Patient's medical history has been reviewed today as available in EPIC chart.  Primary MD: No Pcp, Per Patient    Home Medications:  None     Allergies: Review of patient's allergies indicates no known allergies.    Past  Medical/Surgical History:   Endometriosis  PCOD  Depression  Anxiety  Surgery - laparoscopy for endometriosis in 2011    Family History:   Father - HTN  Mother - dysmenorrhea    Social History: Endorses social EtOH use once per week. Denies tobacco, or illicit drug use. Housed. Sexually active with men, uses condoms, no medical birth control.     Physical Exam  Vitals:    05/24/16 0439   BP: 138/81   Pulse: 88   Resp: 18   Temp: 98 F (36.7 C)   SpO2: 97%   Weight: 73.7 kg (162 lb 7.7 oz)   Height: 5\' 2"  (1.575 m)     Vital signs reviewed and noted: afebrile, hemodynamically stable.   Gen: Patient is in NAD, well-groomed, alert, non-toxic appearing.  HEENT: NC/AT. PERRLA. EOM intact. No scleral icterus, clear conjunctiva, no conjunctival palor. No rhinorrhea. Moist mucus membranes, symmetric palatal rise, pharynx non-exudative, non-erythematous. Clear tympanic membranes bilaterally on otoscopic exam.   Neck: Supple. FROM.  No meningeal signs. No appreciable JVD. No anterior or posterior cervical LAD. No carotid bruits bilaterally.   Pulm: CTA anteriorly and posteriorly. Symmetric air entry bilaterally. No wheeze/rales/rhonchi.    Cardiac: S1S2 RRR. No murmurs/gallops/rubs.   Abd: +RLQ tenderness slightly inferior than McBurneys point, +suprapubic tenderness. Soft, nondistended. Normoactive bowel sounds. No guarding/rebound. No palpable organomegaly.   GU/Pelvic: No appreciable lesions on labia majora/minora, atraumatic. Vaginal walls nonerythematous. Cervical os closed, no appreciable discharge, exudates, or lesions. No CMT on bimanual exam. +right  adnexal tenderness.    Back: No CVAT. No paraspinal tenderness cervical/thoracic/lumbar. No step-offs.   Extr/MSK: Warm, well perfused, distal pulses 2+ upper and lower extremities, symmetric. No edema. No calf tenderness. FROM.  Skin: Warm, dry. No appreciable rashes, lesions, abrasions.   Neuro: Awake, alert, and oriented to person place and time. CN II-XII grossly  intact. Speech normal. Strength 5/5 upper and lower extremities, Sensation symmetric to light touch on CN V and upper/lower extremities bilaterally. Ambulates with steady gait.  Psych: Appropriate.     Labs  No results found for this or any previous visit.    Diagnostic Studies  No results found.    Imaging  US PELVIC TRANSABD/TRANSVAG COMBINATION    (Results Pending)     Limited Bedside Ultrasound Procedure: Focused Assessment with Sonography for Trauma (FAST)      Indication  A focused ultrasound exam of the peritoneal space, pericardial space, and pleural bases was performed to evaluate for free fluid. The ultrasound was performed with the following indications, as noted in the H&P: abdominal pain/distension.    Probe: Phased array probe (4-9mHz)    Identified structures   Hepatorenal interface (including tip of liver)  Spelnorenal interface (including splenic tip)  Pericardial space (either in subxi or PSLA)  Suprapubic space (including bladder and retrovesicular space)    Findings  Exam of the above structures revealed the following findings in regards to presence/absence of free fluid:  Hepatorenal fossa absent  Splenorenal fossa absent  Retrovesicular space present  Pericardial space absent    Impression  . Other: questionable retrovesicular fluid pocket, hypoechoic but not as hypoechoic as bladder. will follow up with formal pelvic ultrasound.     Study reviewed with attending Dr. Pleas Koch.     Assessment & Plan  Pt is a 35 year old y/o female with history of PCOS, endometriosis s/p lap 2011, presenting with RLQ pain for 3 days and positive right adnexal tenderness on pelvic exam. FAST possibly positive for retrovesicular free fluid (only small hypoechoic pocket), otherwise negative. In setting of patients right adnexal tenderness and known PCOS and endometriosis, ovarian cyst vs cyst rupture vs torsion are most likely diagnoses at this time. Low suspicion for appendicitis given afebrile, no nausea, vomiting,  GI symptoms. Other GI diagnoses on differential, although lower suspicion, are volvulus vs intussusception (in setting of prior surgery), however low suspicion given normal bowel movements. Will f/u with formal pelvic ultrasound. If negative, can consider CT abdomen/pelvis. Pain well-controlled with toradol. Pregnancy test negative.     Plan:   - f/u labs  - f/u UA for dysuria  - f/u formal pelvic ultrasound    Dispo: pending ultrasound    Lab results were interpreted and discussed with ED attending. Full ED course and patient progress may be reported in subsequent progress notes. Please review.     Orders Placed This Encounter   Procedures   . US Pelvic Transabd/Transvag Combination   . Urinalysis with Culture Reflex, when indicated   . CBC w/Auto Diff Lavender   . HIV 1/2 Antibody & P24 Antigen Assay Yellow serum separator tube   . Basic Metabolic Panel, Blood Green Plasma Separator Tube   . C-Reactive Protein, Blood Green Plasma Separator Tube       Patient seen and discussed with ED attending, Eilene Ghazi, MD. Signed out to Dr. Murlean Caller.   Marjory Lies, MD  Emergency Medicine PGY-1       Marjory Lies, MD  Resident  05/24/16  [id number]       Eilene Ghazi, MD  05/25/16 1308

## 2016-05-24 NOTE — ED Notes (Signed)
Pt c/o 8/10 sharp RLQ abd pain pain since thurs now radiating to LLQ , bowel sounds present, abd soft/tender in lower quadrants, accompanied by pain with urination and frequency (urinary symptoms started today. Pt seen recently by primary MD suspects rupture ovarian cyst, pt with hx of ovarian cysts. Pt denies vaginal bleeding, endorses "chunky and white discharge since tues. Pt denies cp/sob/n/v/d/fever/chills

## 2016-05-24 NOTE — ED MD Progress Note (Signed)
Workup Review   s/o    Pcos, depression, 3 days RLQ pain with dysuria, no f/c, n/v, cramping pain, pelvic exam with swabs, not preg    To do:  Pelvic US  Swabs  CRP,CBC, belly labs  UA  Reassess and possible CT    Update:  Given metronidazole for BV  Rad pre: [The uterus contains a small subserosal fibroid measuring up to 0.5 cm in the posterior aspect of the uterus. The endometrium appears normal. The left ovary appears normal. The right ovary appears to contain a corpus luteal cyst. There normal arterial and venous waveforms in both ovaries. Trace simple free fluid, likely physiologic.  Urinalysis with signs of leukocytes and activation.  Likely due to transmission from [place].  Will culture urine and treat results if positive.  Patient also to follow up with GC chlamydia swabs.  Right-sided adnexal pain likely due to distension from corpus luteal cyst  Only trace simple free fluid.  The differential diagnosis and aftercare instructions were discussed with the patient in detail who verbalized understanding and appreciation as well as RTED precautions. Additional verbal instructions specific to diagnosis and differential were discussed at length with patient.

## 2016-05-24 NOTE — Discharge Instructions (Signed)
Follow-up with Women's Health for evaluation for possible IUD use to control your PCOS symptoms  Take antibiotics as prescribed for your bacterial vaginosis  Do not drink alcohol on this medication as it will make you nauseous annual likely vomit.  Follow-up with [facility] for your culture results today    You are being discharged from the [facility] after being seen for the condition detailed later in these instructions. Please refer to the instructions for your specific condition and note the symptoms you should watch out for and when to return to the Emergency Dept. You should return as needed if your symptoms worsen or persist, you are unable to eat, drink, or tolerate your medications, you develop chest pain, shortness of breath, or any other health concerns. Please follow up with your primary care doctor, another MD, this department or other specialist as discussed today in your follow up plan within 1-2 days if possible for continuation of care.    - RETURN TO ER IF SYMPTOMS WORSEN OR CONCERNS ARISE  - FOLLOW-UP WITH YOUR PRIMARY CARE DOCTOR IN 1-2 DAYS  - TAKE MEDICATIONS AS DIRECTED  - PLEASE READ PACKAGE INSERT FOR ALL MEDICATIONS FOR POTENTIAL SIDE-EFFECTS AND MEDICATION INTERACTIONS    Vaginitis    You have been diagnosed with vaginitis.    This is an infection caused by bacteria. Some symptoms are vaginal itching or pain, painful urination (peeing) and abnormal vaginal discharge (drainage) that sometimes has a bad smell. The diagnosis is based on symptoms and a physical exam. It is also based on examining the discharge under a microscope.    Vaginitis is treated with medicine for the specific type of infection. No other special treatment or follow-up is needed unless symptoms do not get better or your condition gets worse.    YOU SHOULD SEEK MEDICAL ATTENTION IMMEDIATELY, EITHER HERE OR AT THE NEAREST EMERGENCY DEPARTMENT, IF ANY OF THE FOLLOWING OCCURS:   Pain in the pelvis or lower abdomen  (belly).   Fever (temperature higher than 100.65F / 38C) or chills.   Discharge gets worse or vagina severely (seriously) irritated.

## 2016-05-24 NOTE — ED Notes (Signed)
Pt denies pain, nausea and dizziness. Pt ambulates without difficulty. Pt ambulates without difficulty Pt passed po trial without difficulty. Pt understands my instructions about follow up and treatment.         Pt denies pain. Discharged with Rx for Flagyl as well as instructions sl dc'd and no adverse reaction to meds.

## 2016-05-25 MED ORDER — CEPHALEXIN 500 MG OR CAPS
500.00 mg | ORAL_CAPSULE | Freq: Two times a day (BID) | ORAL | 0 refills | Status: AC
Start: 2016-05-25 — End: 2016-06-01

## 2016-05-25 NOTE — ED Follow-up Note (Signed)
Follow-up type: Callback       Routine ED Patient Call Back    Patient contacted by telephone:   noted positive urine, patient still having dysuria, prescribed keflex x 7 days.

## 2016-05-26 LAB — CHLAMYDIA/GONORRHEA PCR, GENITAL
Chlamydia trachomatis PCR: NOT DETECTED
Neisseria gonorrhoeae PCR: NOT DETECTED

## 2016-05-26 LAB — URINE CULTURE: Urine Culture Result: <10000 — AB

## 2016-05-26 NOTE — ED Follow-up Note (Signed)
ED Micro Cx Follow-up Note on 11/6:   Urine Cx on 11/4 positive for E.Coli sensitive to cephalosporins and resistant to ampicillin.   Patient was discharged with cephalexin 500 mg po bid for 7 days on 11/4   Follow up note from ED tech reports patient doing fine.  Treatment appropriate based on urine culture results.  No further action required.    Oluwadara Gorman Z Myleka Moncure, PHARMD      Urine Culture (Abnormal) Escherichia coli   >100,000 colonies/mL

## 2016-05-26 NOTE — ED Follow-up Note (Signed)
Follow-up type: Callback       Routine ED Patient Call Back    Patient contacted by telephone:  found no issues; patient doing fine.

## 2016-06-17 ENCOUNTER — Encounter (INDEPENDENT_AMBULATORY_CARE_PROVIDER_SITE_OTHER): Payer: Self-pay

## 2017-03-24 ENCOUNTER — Ambulatory Visit (HOSPITAL_BASED_OUTPATIENT_CLINIC_OR_DEPARTMENT_OTHER): Payer: BC Managed Care – PPO | Admitting: Student in an Organized Health Care Education/Training Program

## 2017-07-21 DIAGNOSIS — K635 Polyp of colon: Secondary | ICD-10-CM

## 2017-07-21 HISTORY — DX: Polyp of colon: K63.5

## 2020-07-21 DIAGNOSIS — E079 Disorder of thyroid, unspecified: Secondary | ICD-10-CM

## 2020-07-21 HISTORY — DX: Disorder of thyroid, unspecified: E07.9

## 2020-07-26 ENCOUNTER — Emergency Department: Payer: No Typology Code available for payment source

## 2020-07-26 DIAGNOSIS — I10 Essential (primary) hypertension: Secondary | ICD-10-CM | POA: Insufficient documentation

## 2020-07-26 DIAGNOSIS — R0789 Other chest pain: Secondary | ICD-10-CM | POA: Insufficient documentation

## 2020-07-26 LAB — ECG 12-LEAD
Atrial Rate: 82 {beats}/min
Atrial Rate: 83 {beats}/min
P Axis: 50 degrees
P Axis: 61 degrees
P-R Interval: 122 ms
P-R Interval: 124 ms
Q-T Interval: 370 ms
Q-T Interval: 386 ms
QRS Duration: 68 ms
QRS Duration: 72 ms
QTC Calculation (Bezet): 432 ms
QTC Calculation (Bezet): 453 ms
R Axis: 36 degrees
R Axis: 36 degrees
T Axis: 16 degrees
T Axis: 33 degrees
Ventricular Rate: 82 {beats}/min
Ventricular Rate: 83 {beats}/min

## 2020-07-26 LAB — CBC AND DIFFERENTIAL
Absolute NRBC: 0 10*3/uL (ref 0.00–0.00)
Basophils Absolute Automated: 0.04 10*3/uL (ref 0.00–0.08)
Basophils Automated: 0.3 %
Eosinophils Absolute Automated: 0.17 10*3/uL (ref 0.00–0.44)
Eosinophils Automated: 1.3 %
Hematocrit: 39.1 % (ref 34.7–43.7)
Hgb: 13 g/dL (ref 11.4–14.8)
Immature Granulocytes Absolute: 0.06 10*3/uL (ref 0.00–0.07)
Immature Granulocytes: 0.5 %
Lymphocytes Absolute Automated: 4.85 10*3/uL — ABNORMAL HIGH (ref 0.42–3.22)
Lymphocytes Automated: 36.8 %
MCH: 28.9 pg (ref 25.1–33.5)
MCHC: 33.2 g/dL (ref 31.5–35.8)
MCV: 86.9 fL (ref 78.0–96.0)
MPV: 11.4 fL (ref 8.9–12.5)
Monocytes Absolute Automated: 0.76 10*3/uL (ref 0.21–0.85)
Monocytes: 5.8 %
Neutrophils Absolute: 7.3 10*3/uL — ABNORMAL HIGH (ref 1.10–6.33)
Neutrophils: 55.3 %
Nucleated RBC: 0 /100 WBC (ref 0.0–0.0)
Platelets: 268 10*3/uL (ref 142–346)
RBC: 4.5 10*6/uL (ref 3.90–5.10)
RDW: 15 % (ref 11–15)
WBC: 13.18 10*3/uL — ABNORMAL HIGH (ref 3.10–9.50)

## 2020-07-26 LAB — GFR: EGFR: >60

## 2020-07-26 LAB — COMPREHENSIVE METABOLIC PANEL
ALT: 24 U/L (ref 0–55)
AST (SGOT): 21 U/L (ref 5–34)
Albumin/Globulin Ratio: 1.3 (ref 0.9–2.2)
Albumin: 3.9 g/dL (ref 3.5–5.0)
Alkaline Phosphatase: 82 U/L (ref 37–106)
Anion Gap: 8 (ref 5.0–15.0)
BUN: 6 mg/dL — ABNORMAL LOW (ref 7.0–19.0)
Bilirubin, Total: 0.2 mg/dL (ref 0.2–1.2)
CO2: 25 mEq/L (ref 22–29)
Calcium: 8.9 mg/dL (ref 8.5–10.5)
Chloride: 105 mEq/L (ref 100–111)
Creatinine: 0.8 mg/dL (ref 0.6–1.0)
Globulin: 3 g/dL (ref 2.0–3.6)
Glucose: 102 mg/dL — ABNORMAL HIGH (ref 70–100)
Potassium: 3.6 mEq/L (ref 3.5–5.1)
Protein, Total: 6.9 g/dL (ref 6.0–8.3)
Sodium: 138 mEq/L (ref 136–145)

## 2020-07-26 LAB — HCG QUANTITATIVE: hCG, Quant.: <1.2

## 2020-07-26 LAB — TROPONIN I: Troponin I: <0.01 ng/mL (ref 0.00–0.05)

## 2020-07-27 ENCOUNTER — Emergency Department
Admission: EM | Admit: 2020-07-27 | Discharge: 2020-07-27 | Disposition: A | Discharge status: Home / Self Care | Payer: No Typology Code available for payment source | Attending: Emergency Medicine | Admitting: Emergency Medicine

## 2020-07-27 DIAGNOSIS — R0789 Other chest pain: Secondary | ICD-10-CM

## 2020-07-27 DIAGNOSIS — I10 Essential (primary) hypertension: Secondary | ICD-10-CM

## 2020-07-27 MED ORDER — ACETAMINOPHEN 500 MG PO TABS
1000.0000 mg | ORAL_TABLET | Freq: Once | ORAL | Status: AC
Start: 2020-07-27 — End: 2020-07-27
  Administered 2020-07-27: 01:00:00 1000 mg via ORAL
  Filled 2020-07-27: qty 2

## 2020-07-27 NOTE — Discharge Instructions (Signed)
Dear Elaine Sharp:    Thank you for choosing the [facility] Emergency Department, the premier emergency department in the  area.  I hope your visit today was EXCELLENT. You will receive a survey via text message that will give you the opportunity to provide feedback to your team about your visit. Please do not hesitate to reach out with any questions!    Specific instructions for your visit today:      Hypertension    You have been diagnosed with elevated blood pressure.    The medical term for high blood pressure is hypertension. Many people feel anxious or uncomfortable about being at the hospital. If you feel anxious today, this could make your blood pressure appear high, even if your blood pressure is usually normal. Check your blood pressure several more times when you are not feeling stress. Keep a record of these readings and give this information to your regular doctor. He or she will decide whether you have hypertension that requires medical treatment.    If your blood pressure becomes extremely high all of a sudden, you will probably notice symptoms. In fact, very high blood pressure is a medical emergency. Most people with hypertension have blood pressure that is only a little too high. Mild high blood pressure does not cause specific symptoms. Instead, the effects of hypertension develop slowly over time. Untreated hypertension can affect the heart, brain, kidneys, eyes, and blood vessels. Unfortunately, by the time side-effects become noticeable, the body has already been damaged. This is why hypertension is called the silent killer!    It is important to follow up with your regular doctor. Check your blood pressure several times in the next 1 to 2 weeks and tell your doctor about the results. It may be helpful to keep a log or a journal where you can write down your blood pressures. Note the time of day and the activity you were doing when the reading was taken.    YOU SHOULD SEEK  MEDICAL ATTENTION IMMEDIATELY, EITHER HERE OR AT THE NEAREST EMERGENCY DEPARTMENT, IF ANY OF THE FOLLOWING OCCURS:   You have a headache.   You have chest pain.   You are short of breath or have trouble breathing.    You feel weak, especially on only one side of the body.   Your symptoms get worse or you have other concerns.               Chest Pain of Unclear Etiology    You have been seen for chest pain. The cause of your pain is not yet known.    Your doctor has learned about your medical history, examined you, and checked any tests that were done. Still, it is not clear why you are having pain. The doctor thinks there is only a small chance that your pain is caused by a health problem that could lead to serious harm or death. Later, your primary care doctor might do more tests or check you again.    Sometimes chest pain is caused by a health problem that can lead to death, like a:   Heart attack.   Injury to the large blood vessel in your body (aorta).   Blood clot in the lung.   Collapsed lung.     It is not likely that your pain is caused by a health problem that could lead to death if:    Your chest pain lasts only a few seconds at a time  You are not short of breath, nauseated (sick to your stomach), sweaty, or lightheaded   Your pain gets worse when you twist or bend   Your pain improves with exercise or hard work.    Chest pain is serious. It is very important that you follow up with your regular doctor.    Return here or go to the nearest Emergency Department immediately if:   Your pain makes you short of breath, nauseated (sick to your stomach), or sweaty.   Your pain gets worse when you walk, go up stairs, or exert yourself.   You feel weak, lightheaded, or faint.   It hurts to breathe.   Your leg swells.   Your pain or symptoms get worse    You have new symptoms or concerns.    If you can't follow up with your doctor, or if at any time you feel you need to be rechecked or  seen again, come back here or go to the nearest emergency department.                 IF YOU DO NOT CONTINUE TO IMPROVE OR YOUR CONDITION WORSENS, PLEASE CONTACT YOUR DOCTOR OR RETURN IMMEDIATELY TO THE EMERGENCY DEPARTMENT.    Sincerely,  Diegelmann, Lyman Speller, MD  Attending Emergency Physician  [facility] Emergency Department    ONSITE PHARMACY  Our full service onsite pharmacy is located in the ER waiting room.  Open 7 days a week from 9 am to 9 pm.  We accept all major insurances and prices are competitive with major retailers.  Ask your provider to print your prescriptions down to the pharmacy to speed you on your way home.    OBTAINING A PRIMARY CARE APPOINTMENT    Primary care physicians (PCPs, also known as primary care doctors) are either internists or family medicine doctors. Both types of PCPs focus on health promotion, disease prevention, patient education and counseling, and treatment of acute and chronic medical conditions.    If you need a primary care doctor, please call the below number and ask who is receiving new patients.     [facility]  Telephone:  [phone]  [email or web address]    DOCTOR REFERRALS  Call [phone] (available 24 hours a day, 7 days a week) if you need any further referrals and we can help you find a primary care doctor or specialist.  Also, available online at:  [email or web address]    YOUR CONTACT INFORMATION  Before leaving please check with registration to make sure we have an up-to-date contact number.  You can call registration at [phone] to update your information.  For questions about your hospital bill, please call [phone].  For questions about your Emergency Dept Physician bill please call [phone].      FREE HEALTH SERVICES  If you need help with health or social services, please call 2-1-1 for a free referral to resources in your area.  2-1-1 is a free service connecting people with information on health  insurance, free clinics, pregnancy, mental health, dental care, food assistance, housing, and substance abuse counseling.  Also, available online at:  http://www.[facility].org    MEDICAL RECORDS AND TESTS  Certain laboratory test results do not come back the same day, for example urine cultures.   We will contact you if other important findings are noted.  Radiology films are often reviewed again to ensure accuracy.  If there is any discrepancy, we will notify you.      Please  call [phone] to pick up a complimentary CD of any radiology studies performed.  If you or your doctor would like to request a copy of your medical records, please call [phone].      ORTHOPEDIC INJURY   Please know that significant injuries can exist even when an initial x-ray is read as normal or negative.  This can occur because some fractures (broken bones) are not initially visible on x-rays.  For this reason, close outpatient follow-up with your primary care doctor or bone specialist (orthopedist) is required.    MEDICATIONS AND FOLLOWUP  Please be aware that some prescription medications can cause drowsiness.  Use caution when driving or operating machinery.    The examination and treatment you have received in our Emergency Department is provided on an emergency basis, and is not intended to be a substitute for your primary care physician.  It is important that your doctor checks you again and that you report any new or remaining problems at that time.      [place]  The nearest 24 hour pharmacy is:    [facility] [place], [place] [address]  [place]  [facility])  Call to start or finish an application, compare plans, enroll or ask a question.  [place]: [phone]  Web:  Healthcare.gov    Help Enrolling in [place]  878-837-7043 (TOLL-FREE)  [phone] (TTY)  Web:   Http://www.coverva.org    Local Help Enrolling in the [place]  215-179-5012 (MAIN)  Email:  health-help@nvfs .org  Web:  http://lewis-perez.info/  Address:  [id number] [place], [place] [address]    SEDATING MEDICATIONS  Sedating medications include strong pain medications (e.g. narcotics), muscle relaxers, benzodiazepines (used for anxiety and as muscle relaxers), Benadryl/diphenhydramine and other antihistamines for allergic reactions/itching, and other medications.  If you are unsure if you have received a sedating medication, please ask your physician or nurse.  If you received a sedating medication: DO NOT drive a car. DO NOT operate machinery. DO NOT perform jobs where you need to be alert.  DO NOT drink alcoholic beverages while taking this medicine.     If you get dizzy, sit or lie down at the first signs. Be careful going up and down stairs.  Be extra careful to prevent falls.     Never give this medicine to others.     Keep this medicine out of reach of children.     Do not take or save old medicines. Throw them away when outdated.     Keep all medicines in a cool, dry place. DO NOT keep them in your bathroom medicine cabinet or in a cabinet above the stove.    MEDICATION REFILLS  Please be aware that we cannot refill any prescriptions through the ER. If you need further treatment from what is provided at your ER visit, please follow up with your primary care doctor or your pain management specialist.    [place]  Did you know [facility] has two freestanding ERs located just a few miles away?  [facility] [place] have short wait times, easy free parking directly in front of the building and top patient satisfaction scores - and the same [employer] Emergency Medicine doctors as [facility].

## 2020-07-27 NOTE — ED Provider Notes (Signed)
[facility] EMERGENCY DEPARTMENT H&P      Visit date: 07/27/2020      CLINICAL SUMMARY           Diagnosis:    .     Final diagnoses:   Hypertension, unspecified type, uncontrolled   Atypical chest pain         MDM Notes:    presents to the emergency department complaining of high blood pressure.  Patient is otherwise asymptomatic without confusion, chest pain, hematuria, or SOB.  BP today is 155/88 improved to 134/89  Patient is currently on medication and not consistently taking it on a daily basis.  Doubt CV, AMI, heart failure, renal infarction or failure or other end organ damage.    Disposition:Discussed with patient their elevated blood pressure and need for close outpatient management of their hypertension.   Workup: ECG, CXR, CBC, BMP, Troponin  Findings:  ECG: No overt evidence of STEMI. No evidence of Brugadas sign, delta wave, epsilon wave, significantly prolonged QTc, or malignant arrhythmia  Troponin: Negative x1  Other Labs unremarkable for emergent problems.  CXR: Without PTX, PNA, or widened mediastinum  HEART Score: 3    Given History, Exam, and Workup I have low suspicion for ACS, Pneumothorax, Pneumonia, Pulmonary Embolus, Tamponade, Aortic Dissection or other emergent problem as a cause for this presentation.     Reassesment: Prior to discharge patients pain was controlled and they were well appearing.    Disposition:  Discharge. Strict return precautions discussed with patient with full understanding. Advised patient to follow up promptly with primary care provider    All results/findings were reviewed then discussed with both patient and any family  present and all questions were answered. I rediscussed in depth precautions for which  to return for immediate reeval, and pt expressed understanding.    Myself & staff explained that not all medical issues can be fully evaluated, diagnosed,  and managed in the emergency dept, and reiterated the importance of outpt  followup.  Safe and stable for discharge home with fu as discussed             Disposition:         Discharge           I was present for the bedside discharge alongside the patient's ED nurse. Course of visit in the ED and discharge instructions were reviewed with patient and they were given the opportunity to ask any questions regarding their care today. Patient and/ or patient's family verbalized understanding of, and comfort with, instructions and plan.          Discharge Prescriptions     None                         CLINICAL INFORMATION        HPI:      Chief Complaint: Hypertension  .    Lakesia TAREA SKILLMAN is a 40 y.o. female w/ pmhx of HTN, depression, anxiety, DM, and PCOS who presents with 2 days of intermittent L sided chest pain worse with inspiration and headaches in the setting of elevated BP readings up to 157/110. Patient takes amlodipine, however, is no completely compliant. States she occasionally misses a dose most recently last week.     Patient denies cough, fever, pain/swelling in her legs, hx of blood clots, birth control, hormone replacements, chance of/ currently pregnant, and recent surgeries.     History obtained from: Patient  ROS:      Positive and negative ROS elements as per HPI.  All other systems reviewed and negative.      Physical Exam:      Pulse 92   BP 155/88   Resp 17   SpO2 99 %   Temp 98.8 F (37.1 C)    Physical Exam  Vitals and nursing note reviewed.   Constitutional:       General: She is not in acute distress.     Appearance: She is well-developed. She is not diaphoretic.   HENT:      Head: Normocephalic and atraumatic.   Cardiovascular:      Rate and Rhythm: Normal rate and regular rhythm.      Heart sounds: Normal heart sounds. No murmur heard.  No friction rub. No gallop.    Pulmonary:      Effort: Pulmonary effort is normal. No respiratory distress.      Breath sounds: Normal breath sounds. No wheezing or rales.   Chest:      Chest wall: No tenderness.    Abdominal:      General: Bowel sounds are normal. There is no distension.      Palpations: Abdomen is soft. There is no mass.      Tenderness: There is no abdominal tenderness. There is no guarding or rebound.   Musculoskeletal:         General: No tenderness. Normal range of motion.   Neurological:      Mental Status: She is alert.                   PAST HISTORY        Primary Care Provider: Pcp, None, MD        PMH/PSH:    .     Past Medical History:   Diagnosis Date    Anxiety     Depression     Diabetes mellitus     Hypertension     PCOS (polycystic ovarian syndrome)        She has a past surgical history that includes REMOVAL, LOWER EXTREMITY, A-V FISTULA and endomietriosis.      Social/Family History:      She reports that she has never smoked. She has never used smokeless tobacco. She reports current alcohol use of about 1.0 standard drink of alcohol per week. She reports that she does not use drugs.    Family History   Problem Relation Age of Onset    No known problems Mother     Depression Father     Hyperlipidemia Father          Listed Medications on Arrival:    .     Home Medications     Med List Status: In Progress Set By: Thedore Mins, RN at 07/27/2020  1:02 AM                ALPRAZolam (XANAX) 0.25 MG tablet     Take 0.25 mg by mouth nightly as needed.     amitriptyline (ELAVIL) 25 MG tablet          amLODIPine (NORVASC) 5 MG tablet     Take 5 mg by mouth daily     buPROPion XL (WELLBUTRIN XL) 300 MG 24 hr tablet     Take 1 tablet (300 mg total) by mouth daily.     escitalopram (LEXAPRO) 10 MG tablet     Take 1 tablet (10 mg total) by  mouth daily.     hydroCHLOROthiazide (HYDRODIURIL) 25 MG tablet     Take 1 tablet (25 mg total) by mouth daily.         Allergies: She has No Known Allergies.            VISIT INFORMATION        Clinical Course in the ED:                 Medications Given in the ED:    .     ED Medication Orders (From admission, onward)    Start Ordered     Status Ordering  Provider    07/27/20 0030 07/27/20 0030  acetaminophen (TYLENOL) tablet 1,000 mg  Once        Route: Oral  Ordered Dose: 1,000 mg     Last MAR action: Given SPONAUGLE, CHRISTOPHER BRIAN            Procedures:      Procedures      Interpretations:      O2 sat-           saturation: 99 %; Oxygen use: room air; Interpretation: Normal    Radiology -     interpreted by me with the following observations: Radiology - interpreted by me with the following observations: Xray shows no effusion, no pneumonia, no bony abnormalities, cardiac silhouette normal.   EKG -             interpreted by me: normal sinus at 83.  Normal axis, normal intervals, and T wave flattening in lateral leads.     Heart Score Calculator    0 points 1 point 2 points    History Slightly suspicious Moderately suspicious Highly suspicious +1   EKG Normal Non-specific repolarization disturbance Significant ST depression +1   Age (years) <45 45-64 ?65 0   Risk factors* No known risk factors 1-2 risk factors ?3 risk factors or history of atherosclerotic disease +1   Initial troponin ?normal limit 1-3 normal limit >3 normal limit 0      Total Score   3     *Risk factors: HTN, hypercholesterolemia, DM, obesity (BMI >30 kg/m), smoking (current, or smoking cessation ?3 month), positive family history (parent or sibling with CVD before age 25); atherosclerotic disease: prior MI, PCI/CABG, CVA/TIA, or peripheral arterial disease.  General Recommendations   (Some patients may require a more conservative plan of care)   Score 0 - 3:  Discharge and follow up with PCP. (Major Adverse Cardiac Event risk 2.5% over next six weeks)   Score 4 - 6: Observe for 6 hours and consider inpatient or early outpatient stress testing in the Chest Pain Clinic. (Major Adverse Cardiac Event risk 20.3% over next six weeks)   Score 7 - 10: Observe and consider in-hospital stress testing or Cardiology consultation. (Major Adverse Cardiac Event risk 72.7% over next six weeks)  A  Major Adverse Cardiac Event was defined as all-cause mortality, myocardial infarction, or coronary revascularization.      Heart Score      Value   History 0   EKG 1   Risk Factors 2   Total (with age) 3   Onset of pain (time of START of last episode of chest pain)? >6 hrs ago                RESULTS        Lab Results:      Results  Procedure Component Value Units Date/Time    Troponin I [[id number]] Collected: 07/26/20 2157    Specimen: Blood Updated: 07/26/20 2309     Troponin I <0.01 ng/mL     Beta HCG, Quant, Serum [[id number]] Collected: 07/26/20 2157     Updated: 07/26/20 2309     hCG, Sharene Butters. <1.2    Comprehensive metabolic panel [[id number]]  (Abnormal) Collected: 07/26/20 2157    Specimen: Blood Updated: 07/26/20 2300     Glucose 102 mg/dL      BUN 6.0 mg/dL      Creatinine 0.8 mg/dL      Sodium 425 mEq/L      Potassium 3.6 mEq/L      Chloride 105 mEq/L      CO2 25 mEq/L      Calcium 8.9 mg/dL      Protein, Total 6.9 g/dL      Albumin 3.9 g/dL      AST (SGOT) 21 U/L      ALT 24 U/L      Alkaline Phosphatase 82 U/L      Bilirubin, Total 0.2 mg/dL      Globulin 3.0 g/dL      Albumin/Globulin Ratio 1.3     Anion Gap 8.0    GFR [[id number]] Collected: 07/26/20 2157     Updated: 07/26/20 2300     EGFR >60.0    CBC and differential [[id number]]  (Abnormal) Collected: 07/26/20 2157    Specimen: Blood Updated: 07/26/20 2224     WBC 13.18 x10 3/uL      Hgb 13.0 g/dL      Hematocrit 16.6 %      Platelets 268 x10 3/uL      RBC 4.50 x10 6/uL      MCV 86.9 fL      MCH 28.9 pg      MCHC 33.2 g/dL      RDW 15 %      MPV 11.4 fL      Neutrophils 55.3 %      Lymphocytes Automated 36.8 %      Monocytes 5.8 %      Eosinophils Automated 1.3 %      Basophils Automated 0.3 %      Immature Granulocytes 0.5 %      Nucleated RBC 0.0 /100 WBC      Neutrophils Absolute 7.30 x10 3/uL      Lymphocytes Absolute Automated 4.85 x10 3/uL      Monocytes Absolute Automated 0.76 x10 3/uL      Eosinophils Absolute Automated 0.17 x10 3/uL       Basophils Absolute Automated 0.04 x10 3/uL      Immature Granulocytes Absolute 0.06 x10 3/uL      Absolute NRBC 0.00 x10 3/uL               Radiology Results:      XR Chest  AP Portable   Final Result     Normal study.      Nelta Numbers, MD    07/26/2020 10:35 PM                  Scribe Attestation:      I was acting as a Neurosurgeon for [employer], Lyman Speller, MD on Musich,Barbar M  Treatment Team: Scribe: Joya San     I am the first provider for this patient and I personally performed the services documented. Treatment Team: Scribe: Joycie Peek T is scribing for me  on [place],Lauriel M. This note and the patient instructions accurately reflect work and decisions made by me.  Shamona Wirtz, Lyman Speller, MD          Hasson Gaspard, Lyman Speller, MD  07/27/20 [phone]

## 2020-07-27 NOTE — ED Triage Notes (Signed)
Patient A & O x 4, speech clear. Patient presents to ED for c/o HTN - patient states BP in the 150's systolic and intermittent chest pains x 2 days. Patient taking amlodipine for HTN, has missed doses occasionally. Patient states BP has been normally running in the 150's, does not currently have a PCP. Covid vaccinated.

## 2021-02-26 ENCOUNTER — Encounter: Payer: Self-pay | Admitting: Podiatry

## 2021-02-26 ENCOUNTER — Other Ambulatory Visit: Payer: Self-pay

## 2021-02-26 ENCOUNTER — Ambulatory Visit (INDEPENDENT_AMBULATORY_CARE_PROVIDER_SITE_OTHER): Payer: Self-pay | Admitting: Podiatry

## 2021-02-26 DIAGNOSIS — L600 Ingrowing nail: Secondary | ICD-10-CM

## 2021-02-26 NOTE — Patient Instructions (Addendum)
Place 1/4 cup of epsom salts in a quart of warm tap water.  Submerge your foot or feet in the solution and soak for 20 minutes.  This soak should be done twice a day.  Next, remove your foot or feet from solution, blot dry the affected area. Apply ointment and cover if instructed by your doctor.   IF YOUR SKIN BECOMES IRRITATED WHILE USING THESE INSTRUCTIONS, IT IS OKAY TO SWITCH TO  WHITE VINEGAR AND WATER.  As another alternative soak, you may use antibacterial soap and water.  Monitor for any signs/symptoms of infection. Call the office immediately if any occur or go directly to the emergency room. Call with any questions/concerns.  Ingrown Toenail An ingrown toenail occurs when the corner or sides of a toenail grow into the surrounding skin. This causes discomfort and pain. The big toe is most commonly affected, but any of the toes can be affected. If an ingrown toenail is not treated, it can become infected. What are the causes? This condition may be caused by:  Wearing shoes that are too small or tight.  An injury, such as stubbing your toe or having your toe stepped on.  Improper cutting or care of your toenails.  Having nail or foot abnormalities that were present from birth (congenital abnormalities), such as having a nail that is too big for your toe. What increases the risk? The following factors may make you more likely to develop ingrown toenails:  Age. Nails tend to get thicker with age, so ingrown nails are more common among older people.  Cutting your toenails incorrectly, such as cutting them very short or cutting them unevenly. An ingrown toenail is more likely to get infected if you have:  Diabetes.  Blood flow (circulation) problems. What are the signs or symptoms? Symptoms of an ingrown toenail may include:  Pain, soreness, or tenderness.  Redness.  Swelling.  Hardening of the skin that surrounds the toenail. Signs that an ingrown toenail may be infected  include:  Fluid or pus.  Symptoms that get worse instead of better. How is this diagnosed? An ingrown toenail may be diagnosed based on your medical history, your symptoms, and a physical exam. If you have fluid or blood coming from your toenail, a sample may be collected to test for the specific type of bacteria that is causing the infection. How is this treated? Treatment depends on how severe your ingrown toenail is. You may be able to care for your toenail at home.  If you have an infection, you may be prescribed antibiotic medicines.  If you have fluid or pus draining from your toenail, your health care provider may drain it.  If you have trouble walking, you may be given crutches to use.  If you have a severe or infected ingrown toenail, you may need a procedure to remove part or all of the nail. Follow these instructions at home: Foot care  Do not pick at your toenail or try to remove it yourself.  Soak your foot in warm, soapy water. Do this for 20 minutes, 3 times a day, or as often as told by your health care provider. This helps to keep your toe clean and keep your skin soft.  Wear shoes that fit well and are not too tight. Your health care provider may recommend that you wear open-toed shoes while you heal.  Trim your toenails regularly and carefully. Cut your toenails straight across to prevent injury to the skin at the corners   of the toenail. Do not cut your nails in a curved shape.  Keep your feet clean and dry to help prevent infection.   Medicines  Take over-the-counter and prescription medicines only as told by your health care provider.  If you were prescribed an antibiotic, take it as told by your health care provider. Do not stop taking the antibiotic even if you start to feel better. Activity  Return to your normal activities as told by your health care provider. Ask your health care provider what activities are safe for you.  Avoid activities that cause  pain. General instructions  If your health care provider told you to use crutches to help you move around, use them as instructed.  Keep all follow-up visits as told by your health care provider. This is important. Contact a health care provider if:  You have more redness, swelling, pain, or other symptoms that do not improve with treatment.  You have fluid, blood, or pus coming from your toenail. Get help right away if:  You have a red streak on your skin that starts at your foot and spreads up your leg.  You have a fever. Summary  An ingrown toenail occurs when the corner or sides of a toenail grow into the surrounding skin. This causes discomfort and pain. The big toe is most commonly affected, but any of the toes can be affected.  If an ingrown toenail is not treated, it can become infected.  Fluid or pus draining from your toenail is a sign of infection. Your health care provider may need to drain it. You may be given antibiotics to treat the infection.  Trimming your toenails regularly and properly can help you prevent an ingrown toenail. This information is not intended to replace advice given to you by your health care provider. Make sure you discuss any questions you have with your health care provider. Document Revised: 10/29/2018 Document Reviewed: 03/25/2017 Elsevier Patient Education  2021 [employer] Inc.  

## 2021-02-27 NOTE — Progress Notes (Signed)
  Subjective:  Patient ID: Alexandra Escobar, female    DOB: 07-30-1980,  MRN: [id number]  Chief Complaint  Patient presents with   Nail Problem    (np) Previous ingrowns removed;bil-wanted routine care,  self pay    40 y.o. female presents with the above complaint. History confirmed with patient.  She has had this done multiple times before but they did not work previously at the time she had infections.  Objective:  Physical Exam: warm, good capillary refill, no trophic changes or ulcerative lesions, normal DP and PT pulses, and normal sensory exam. Left Foot: Ingrowing medial and lateral borders hallux nail Right Foot: Ingrowing medial and lateral borders hallux nail  Assessment:   1. Ingrowing left great toenail   2. Ingrowing right great toenail      Plan:  Patient was evaluated and treated and all questions answered.     Ingrown Nail, bilaterally -Patient elects to proceed with minor surgery to remove ingrown toenail today. Consent reviewed and signed by patient. -Ingrown nail excised. See procedure note. -Educated on post-procedure care including soaking. Written instructions provided and reviewed. -Discussed with her if this fails then would plan for surgical matricectomy  Procedure: Excision of Ingrown Toenail Location: Bilateral hallux medial and lateral  nail borders. Anesthesia: Lidocaine 1% plain; 1.5 mL and Marcaine 0.5% plain; 1.5 mL, digital block. Skin Prep: Betadine. Dressing: Silvadene; telfa; dry, sterile, compression dressing. Technique: Following skin prep, the toe was exsanguinated and a tourniquet was secured at the base of the toe. The affected nail border was freed, split with a nail splitter, and excised. Chemical matrixectomy was then performed with phenol and irrigated out with alcohol. The tourniquet was then removed and sterile dressing applied. Disposition: Patient tolerated procedure well.

## 2021-03-07 ENCOUNTER — Other Ambulatory Visit: Payer: Self-pay

## 2021-03-07 ENCOUNTER — Ambulatory Visit (INDEPENDENT_AMBULATORY_CARE_PROVIDER_SITE_OTHER): Payer: Self-pay | Admitting: Podiatry

## 2021-03-07 DIAGNOSIS — L600 Ingrowing nail: Secondary | ICD-10-CM

## 2021-03-12 ENCOUNTER — Encounter: Payer: Self-pay | Admitting: Podiatry

## 2021-03-12 NOTE — Progress Notes (Signed)
  Subjective:  Patient ID: Alexandra Escobar, female    DOB: October 31, 1980,  MRN: [id number]  Chief Complaint  Patient presents with   Ingrown Toenail      nail check, believes it's infected, yellow oozing    40 y.o. female returns for follow-up with the above complaint. History confirmed with patient.  She states that is had some redness and still some bleeding and drainage  Objective:  Physical Exam: warm, good capillary refill, no trophic changes or ulcerative lesions, normal DP and PT pulses, and normal sensory exam. Bilateral hallux she has well-healing matricectomy sites on medial and lateral borders  Assessment:   1. Ingrowing right great toenail   2. Ingrowing left great toenail       Plan:  Patient was evaluated and treated and all questions answered.     Ingrown Nail, bilaterally -Overall healing well without signs of infection.  I recommend discontinuing soaks and ointment at this point and leaving open to air

## 2021-06-05 LAB — HM PAP SMEAR

## 2021-06-11 DIAGNOSIS — O3411 Maternal care for benign tumor of corpus uteri, first trimester: Secondary | ICD-10-CM | POA: Diagnosis not present

## 2021-06-11 DIAGNOSIS — O021 Missed abortion: Secondary | ICD-10-CM | POA: Diagnosis not present

## 2021-06-11 DIAGNOSIS — O161 Unspecified maternal hypertension, first trimester: Secondary | ICD-10-CM | POA: Diagnosis not present

## 2021-06-11 DIAGNOSIS — Z3481 Encounter for supervision of other normal pregnancy, first trimester: Secondary | ICD-10-CM | POA: Diagnosis not present

## 2021-06-11 DIAGNOSIS — Z3A08 8 weeks gestation of pregnancy: Secondary | ICD-10-CM | POA: Diagnosis not present

## 2021-06-11 DIAGNOSIS — Z01419 Encounter for gynecological examination (general) (routine) without abnormal findings: Secondary | ICD-10-CM | POA: Diagnosis not present

## 2021-06-11 DIAGNOSIS — Z6833 Body mass index (BMI) 33.0-33.9, adult: Secondary | ICD-10-CM | POA: Diagnosis not present

## 2021-06-11 DIAGNOSIS — Z3490 Encounter for supervision of normal pregnancy, unspecified, unspecified trimester: Secondary | ICD-10-CM | POA: Insufficient documentation

## 2021-06-11 DIAGNOSIS — O09521 Supervision of elderly multigravida, first trimester: Secondary | ICD-10-CM | POA: Diagnosis not present

## 2021-06-11 DIAGNOSIS — F32A Depression, unspecified: Secondary | ICD-10-CM | POA: Insufficient documentation

## 2021-06-11 DIAGNOSIS — E282 Polycystic ovarian syndrome: Secondary | ICD-10-CM | POA: Insufficient documentation

## 2021-06-20 HISTORY — PX: DILATION AND CURETTAGE OF UTERUS: SHX78

## 2021-06-27 DIAGNOSIS — R030 Elevated blood-pressure reading, without diagnosis of hypertension: Secondary | ICD-10-CM | POA: Diagnosis not present

## 2021-06-27 DIAGNOSIS — Z6834 Body mass index (BMI) 34.0-34.9, adult: Secondary | ICD-10-CM | POA: Diagnosis not present

## 2021-06-27 DIAGNOSIS — Z79899 Other long term (current) drug therapy: Secondary | ICD-10-CM | POA: Diagnosis not present

## 2021-06-27 DIAGNOSIS — R5383 Other fatigue: Secondary | ICD-10-CM | POA: Diagnosis not present

## 2021-06-27 DIAGNOSIS — F32A Depression, unspecified: Secondary | ICD-10-CM | POA: Diagnosis not present

## 2021-06-27 DIAGNOSIS — I10 Essential (primary) hypertension: Secondary | ICD-10-CM | POA: Diagnosis not present

## 2021-06-28 DIAGNOSIS — F410 Panic disorder [episodic paroxysmal anxiety] without agoraphobia: Secondary | ICD-10-CM | POA: Diagnosis not present

## 2021-06-28 DIAGNOSIS — F332 Major depressive disorder, recurrent severe without psychotic features: Secondary | ICD-10-CM | POA: Diagnosis not present

## 2021-07-02 DIAGNOSIS — M9901 Segmental and somatic dysfunction of cervical region: Secondary | ICD-10-CM | POA: Diagnosis not present

## 2021-07-02 DIAGNOSIS — M5386 Other specified dorsopathies, lumbar region: Secondary | ICD-10-CM | POA: Diagnosis not present

## 2021-07-02 DIAGNOSIS — M530 Cervicocranial syndrome: Secondary | ICD-10-CM | POA: Diagnosis not present

## 2021-07-02 DIAGNOSIS — M5384 Other specified dorsopathies, thoracic region: Secondary | ICD-10-CM | POA: Diagnosis not present

## 2021-07-03 DIAGNOSIS — F332 Major depressive disorder, recurrent severe without psychotic features: Secondary | ICD-10-CM | POA: Diagnosis not present

## 2021-07-05 ENCOUNTER — Other Ambulatory Visit: Payer: Self-pay

## 2021-07-05 ENCOUNTER — Emergency Department (HOSPITAL_BASED_OUTPATIENT_CLINIC_OR_DEPARTMENT_OTHER)
Admission: EM | Admit: 2021-07-05 | Discharge: 2021-07-05 | Disposition: A | Discharge status: Home / Self Care | Payer: BC Managed Care – PPO | Attending: Emergency Medicine | Admitting: Emergency Medicine

## 2021-07-05 ENCOUNTER — Encounter (HOSPITAL_BASED_OUTPATIENT_CLINIC_OR_DEPARTMENT_OTHER): Payer: Self-pay

## 2021-07-05 DIAGNOSIS — R519 Headache, unspecified: Secondary | ICD-10-CM

## 2021-07-05 DIAGNOSIS — H6690 Otitis media, unspecified, unspecified ear: Secondary | ICD-10-CM

## 2021-07-05 DIAGNOSIS — Z3A11 11 weeks gestation of pregnancy: Secondary | ICD-10-CM | POA: Diagnosis not present

## 2021-07-05 DIAGNOSIS — O26891 Other specified pregnancy related conditions, first trimester: Secondary | ICD-10-CM | POA: Diagnosis not present

## 2021-07-05 DIAGNOSIS — Z79899 Other long term (current) drug therapy: Secondary | ICD-10-CM | POA: Diagnosis not present

## 2021-07-05 DIAGNOSIS — H6692 Otitis media, unspecified, left ear: Secondary | ICD-10-CM | POA: Insufficient documentation

## 2021-07-05 DIAGNOSIS — O10011 Pre-existing essential hypertension complicating pregnancy, first trimester: Secondary | ICD-10-CM | POA: Diagnosis not present

## 2021-07-05 DIAGNOSIS — O99891 Other specified diseases and conditions complicating pregnancy: Secondary | ICD-10-CM | POA: Diagnosis not present

## 2021-07-05 HISTORY — DX: Essential (primary) hypertension: I10

## 2021-07-05 MED ORDER — NEOMYCIN-POLYMYXIN-HC 3.5-10000-1 OT SUSP: 4.0000 [drp] | Freq: Three times a day (TID) | OTIC | 0 refills | Status: AC

## 2021-07-05 MED ORDER — ONDANSETRON HCL 4 MG/2ML IJ SOLN
4.0000 mg | Freq: Once | INTRAMUSCULAR | Status: AC
  Administered 2021-07-05: 4 mg via INTRAVENOUS
  Filled 2021-07-05: qty 2

## 2021-07-05 MED ORDER — NEOMYCIN-POLYMYXIN-HC 3.5-10000-1 OT SUSP: 4.0000 [drp] | Freq: Three times a day (TID) | OTIC | 0 refills | Status: DC

## 2021-07-05 MED ORDER — ACETAMINOPHEN 325 MG PO TABS
650.0000 mg | ORAL_TABLET | Freq: Once | ORAL | Status: AC
  Administered 2021-07-05: 650 mg via ORAL
  Filled 2021-07-05: qty 2

## 2021-07-05 MED ORDER — SODIUM CHLORIDE 0.9 % IV BOLUS
1000.0000 mL | Freq: Once | INTRAVENOUS | Status: AC
  Administered 2021-07-05: 1000 mL via INTRAVENOUS

## 2021-07-05 NOTE — ED Triage Notes (Signed)
Patient here POV from Home with Headache.  Headache has been intermittent for 4 days but continuous since last PM. More Left-Sided. Mild Nausea. No Vomiting. Patient does use Labetalol (since Nov. 20).   Patient is [redacted] Weeks Pregnant. First Pregnancy. No Neurological Deficits noted in Triage. A&Ox4. GCS 15. Ambulatory.

## 2021-07-05 NOTE — ED Provider Notes (Signed)
[facility] EMERGENCY DEPT Provider Note   CSN: [id number] Arrival date & time: 07/05/21  1719     History Chief Complaint  Patient presents with   Headache    Alexandra Escobar is a G1P0 [redacted] week pregnant  40 y.o. female with a past medical history of hypertension presenting today due to headache.  She reports she has had on and off headaches for the past 2 weeks however over the past 3 days she is felt as though her headache has worsened.  Mostly on the left side and endorses pressure behind her eye.  Has not taking anything to relieve the pain.  No visual disturbances or vomiting.  No abdominal pain however some nausea.  She has been seeing a chiropractor for her headaches as well as neck tension but cannot fully shake this headache.  Also complaining of left ear pain and pressure.   Past Medical History:  Diagnosis Date   Hypertension     There are no problems to display for this patient.   History reviewed. No pertinent surgical history.   OB History     Gravida  1   Para      Term      Preterm      AB      Living         SAB      IAB      Ectopic      Multiple      Live Births              No family history on file.  Social History   Tobacco Use   Smoking status: Never   Smokeless tobacco: Never  Substance Use Topics   Alcohol use: Never   Drug use: Never    Home Medications Prior to Admission medications   Medication Sig Start Date End Date Taking? Authorizing Provider  acetaminophen (TYLENOL) 500 MG tablet Take by mouth. 06/06/19   [provider]  albuterol (VENTOLIN HFA) 108 (90 Base) MCG/ACT inhaler Inhale into the lungs. 06/06/19   [provider]  ALPRAZolam Prudy Feeler) 0.5 MG tablet Take by mouth. 03/08/15   [provider]  amitriptyline (ELAVIL) 25 MG tablet  10/04/15   [provider]  amLODipine (NORVASC) 5 MG tablet Take by mouth.    [provider]  buPROPion (WELLBUTRIN  XL) 300 MG 24 hr tablet Take 1 tablet by mouth daily. 12/18/07   [provider]  clonazePAM (KLONOPIN) 1 MG tablet Take 0.5-1 mg by mouth daily as needed. 01/23/21   [provider]  escitalopram (LEXAPRO) 20 MG tablet Take 20 mg by mouth daily. 01/19/21   [provider]  hydrochlorothiazide (HYDRODIURIL) 25 MG tablet Take 1 tablet by mouth daily. 02/12/16   [provider]  levonorgestrel-ethinyl estradiol (LEVORA 0.15/30, 28,) 0.15-30 MG-MCG tablet Take 1 tablet by mouth daily. 03/21/15   [provider]  metroNIDAZOLE (FLAGYL) 500 MG tablet Take by mouth. 05/24/16   [provider]    Allergies    Patient has no known allergies.  Review of Systems   Review of Systems  HENT:  Positive for ear pain. Negative for ear discharge.   Eyes:  Negative for photophobia and visual disturbance.  Gastrointestinal:  Positive for nausea. Negative for vomiting.  Neurological:  Positive for headaches. Negative for numbness.   Physical Exam Updated Vital Signs BP (!) 147/100 (BP Location: Right Arm)    Pulse 80    Temp 98.1  F (36.7 C) (Oral)    Resp 16    Ht 5\' 2"  (1.575 m)    Wt 85.7 kg    LMP 06/08/2021    SpO2 98%    BMI 34.57 kg/m   Physical Exam Vitals and nursing note reviewed.  Constitutional:      General: She is not in acute distress.    Appearance: Normal appearance. She is not ill-appearing.  HENT:     Head: Normocephalic and atraumatic.     Right Ear: Tympanic membrane and ear canal normal.     Left Ear: No drainage or tenderness. There is mastoid tenderness. Tympanic membrane is erythematous.     Mouth/Throat:     Mouth: Mucous membranes are moist.     Pharynx: Oropharynx is clear.  Eyes:     General: No scleral icterus.    Extraocular Movements: Extraocular movements intact.     Right eye: Normal extraocular motion and no nystagmus.     Left eye: Normal extraocular motion and no nystagmus.     Conjunctiva/sclera: Conjunctivae  normal.     Pupils: Pupils are equal, round, and reactive to light.  Cardiovascular:     Rate and Rhythm: Normal rate and regular rhythm.  Pulmonary:     Effort: Pulmonary effort is normal. No respiratory distress.     Breath sounds: No wheezing.  Skin:    Findings: No rash.  Neurological:     Mental Status: She is alert.     Cranial Nerves: No cranial nerve deficit.  Psychiatric:        Mood and Affect: Mood normal.    ED Results / Procedures / Treatments   Labs (all labs ordered are listed, but only abnormal results are displayed) Labs Reviewed - No data to display  EKG None  Radiology No results found.  Procedures Procedures   Medications Ordered in ED Medications  acetaminophen (TYLENOL) tablet 650 mg (650 mg Oral Given 07/05/21 1837)  sodium chloride 0.9 % bolus 1,000 mL (0 mLs Intravenous Stopped 07/05/21 2059)  ondansetron (ZOFRAN) injection 4 mg (4 mg Intravenous Given 07/05/21 2103)    ED Course  I have reviewed the triage vital signs and the nursing notes.  Pertinent labs & imaging results that were available during my care of the patient were reviewed by me and considered in my medical decision making (see chart for details).    MDM Rules/Calculators/A&P 40 year old female, past medical history of hypertension, presenting due to headache.  She is pregnant for the first time and has not taken anything at home to treat her headache.  Work-up: -Neurologic exam benign.  PERRLA without photophobia. -Given a fluid bolus as well as Tylenol.  She reported that her headache was the same and that she was becoming more nauseous.  Zofran given which made her symptoms slightly better.  Requesting discharge home at this time.   No blood work needed at this time.  Patient is to follow-up with her OB/GYN about long-term treatment of headaches as well as control of her blood pressure.  She is agreeable to this plan.  I have sent antibiotic drops to the pharmacy for her  left ear infection.  She will use these as prescribed.  Final Clinical Impression(s) / ED Diagnoses Final diagnoses:  Pregnancy headache in first trimester  Acute otitis media, unspecified otitis media type    Rx / DC Orders Results and diagnoses were explained to the patient. Return precautions discussed in full. Patient had no additional questions  and expressed complete understanding.     Woodroe Chen 07/05/21 2159    Jacalyn Lefevre, MD 07/05/21 [phone]

## 2021-07-05 NOTE — Discharge Instructions (Addendum)
It is safe to take Tylenol in pregnancy.  Use this to treat your headaches.  It is important for you to follow-up with your OB/GYN about recurrent headaches as well as your elevated blood pressure.  Antibiotics have been sent to the pharmacy for your left ear.  Please use these drops as prescribed.

## 2021-07-06 DIAGNOSIS — D259 Leiomyoma of uterus, unspecified: Secondary | ICD-10-CM | POA: Insufficient documentation

## 2021-07-09 DIAGNOSIS — O021 Missed abortion: Secondary | ICD-10-CM | POA: Diagnosis not present

## 2021-07-11 DIAGNOSIS — O021 Missed abortion: Secondary | ICD-10-CM | POA: Diagnosis not present

## 2021-07-11 DIAGNOSIS — Z79899 Other long term (current) drug therapy: Secondary | ICD-10-CM | POA: Diagnosis not present

## 2021-07-11 DIAGNOSIS — I10 Essential (primary) hypertension: Secondary | ICD-10-CM | POA: Diagnosis not present

## 2021-07-25 DIAGNOSIS — R87615 Unsatisfactory cytologic smear of cervix: Secondary | ICD-10-CM | POA: Diagnosis not present

## 2021-07-26 DIAGNOSIS — F332 Major depressive disorder, recurrent severe without psychotic features: Secondary | ICD-10-CM | POA: Diagnosis not present

## 2021-07-30 DIAGNOSIS — F3181 Bipolar II disorder: Secondary | ICD-10-CM | POA: Diagnosis not present

## 2021-07-30 DIAGNOSIS — F410 Panic disorder [episodic paroxysmal anxiety] without agoraphobia: Secondary | ICD-10-CM | POA: Diagnosis not present

## 2021-07-30 DIAGNOSIS — F4312 Post-traumatic stress disorder, chronic: Secondary | ICD-10-CM | POA: Diagnosis not present

## 2021-07-30 DIAGNOSIS — F430 Acute stress reaction: Secondary | ICD-10-CM | POA: Diagnosis not present

## 2021-07-30 DIAGNOSIS — Z79899 Other long term (current) drug therapy: Secondary | ICD-10-CM | POA: Diagnosis not present

## 2021-07-30 DIAGNOSIS — F332 Major depressive disorder, recurrent severe without psychotic features: Secondary | ICD-10-CM | POA: Diagnosis not present

## 2021-07-30 DIAGNOSIS — Z1339 Encounter for screening examination for other mental health and behavioral disorders: Secondary | ICD-10-CM | POA: Diagnosis not present

## 2021-07-31 ENCOUNTER — Telehealth: Payer: BC Managed Care – PPO | Admitting: Nurse Practitioner

## 2021-07-31 DIAGNOSIS — R112 Nausea with vomiting, unspecified: Secondary | ICD-10-CM

## 2021-07-31 DIAGNOSIS — R6883 Chills (without fever): Secondary | ICD-10-CM

## 2021-07-31 DIAGNOSIS — J398 Other specified diseases of upper respiratory tract: Secondary | ICD-10-CM | POA: Diagnosis not present

## 2021-07-31 DIAGNOSIS — K591 Functional diarrhea: Secondary | ICD-10-CM

## 2021-07-31 NOTE — Patient Instructions (Signed)
First 24 Hours-Clear liquids  popsicles  Jello  gatorade  Sprite Second 24 hours-Add Full liquids ( Liquids you cant see through) Third 24 hours- Bland diet ( foods that are baked or broiled)  *avoiding fried foods and highly spiced foods* During these 3 days  Avoid milk, cheese, ice cream or any other dairy products  Avoid caffeine- REMEMBER Mt. Dew and Mello Yellow contain lots of caffeine You should eat and drink in  Frequent small volumes If no improvement in symptoms or worsen in 2-3 days should RETRUN TO OFFICE or go to ER!   1. Take meds as prescribed 2. Use a cool mist humidifier especially during the winter months and when heat has been humid. 3. Use saline nose sprays frequently 4. Saline irrigations of the nose can be very helpful if done frequently.  * 4X daily for 1 week*  * Use of a nettie pot can be helpful with this. Follow directions with this* 5. Drink plenty of fluids 6. Keep thermostat turn down low 7.For any cough or congestion- delsym 8. For fever or aces or pains- take tylenol or ibuprofen appropriate for age and weight.  * for fevers greater than 101 orally you may alternate ibuprofen and tylenol every  3 hours.

## 2021-07-31 NOTE — Progress Notes (Signed)
Virtual Visit Consent   Alexandra Escobar, you are scheduled for a virtual visit with Alexandra Hassell Done, FNP, a [facility] provider, today.     Just as with appointments in the office, your consent must be obtained to participate.  Your consent will be active for this visit and any virtual visit you may have with one of our providers in the next 365 days.     If you have a MyChart account, a copy of this consent can be sent to you electronically.  All virtual visits are billed to your insurance company just like a traditional visit in the office.    As this is a virtual visit, video technology does not allow for your provider to perform a traditional examination.  This may limit your provider's ability to fully assess your condition.  If your provider identifies any concerns that need to be evaluated in person or the need to arrange testing (such as labs, EKG, etc.), we will make arrangements to do so.     Although advances in technology are sophisticated, we cannot ensure that it will always work on either your end or our end.  If the connection with a video visit is poor, the visit may have to be switched to a telephone visit.  With either a video or telephone visit, we are not always able to ensure that we have a secure connection.     I need to obtain your verbal consent now.   Are you willing to proceed with your visit today? YES   Adalina Lawe has provided verbal consent on 07/31/2021 for a virtual visit (video or telephone).   Alexandra Hassell Done, FNP   Date: 07/31/2021 2:30 PM   Virtual Visit via Video Note   I, Alexandra Marilla Boddy, connected with Alexandra Escobar ([id number], 02/12/1981) on 07/31/21 at  2:30 PM EST by a video-enabled telemedicine application and verified that I am speaking with the correct person using two identifiers.  Location: Patient: Virtual Visit Location Patient: Home Provider: Virtual Visit Location Provider: Mobile   I discussed the limitations of evaluation  and management by telemedicine and the availability of in person appointments. The patient expressed understanding and agreed to proceed.    History of Present Illness: Alexandra Escobar is a 41 y.o. who identifies as a female who was assigned female at birth, and is being seen today for nausea and vomiting.  HPI: Patient states that she had to leave work today because she has been having diarrhea for the past 5 days. She has has nausea and vomiting with chills and sore throat. Lots of phlegm in her throat. Has lost her taste and has no appetite. She developed a headache and chills today. She has not taken any OTC meds.    Review of Systems  Constitutional:  Positive for malaise/fatigue. Negative for chills and fever.  HENT:  Positive for congestion and sore throat.   Respiratory:  Negative for cough and sputum production.   Gastrointestinal:  Positive for diarrhea, nausea and vomiting.  Neurological:  Positive for headaches.   Problems: There are no problems to display for this patient.   Allergies: No Known Allergies Medications:  Current Outpatient Medications:    acetaminophen (TYLENOL) 500 MG tablet, Take by mouth., Disp: , Rfl:    albuterol (VENTOLIN HFA) 108 (90 Base) MCG/ACT inhaler, Inhale into the lungs., Disp: , Rfl:    ALPRAZolam (XANAX) 0.5 MG tablet, Take by mouth., Disp: , Rfl:    amitriptyline (ELAVIL) 25 MG tablet, ,  Disp: , Rfl:    amLODipine (NORVASC) 5 MG tablet, Take by mouth., Disp: , Rfl:    buPROPion (WELLBUTRIN XL) 300 MG 24 hr tablet, Take 1 tablet by mouth daily., Disp: , Rfl:    clonazePAM (KLONOPIN) 1 MG tablet, Take 0.5-1 mg by mouth daily as needed., Disp: , Rfl:    escitalopram (LEXAPRO) 20 MG tablet, Take 20 mg by mouth daily., Disp: , Rfl:    hydrochlorothiazide (HYDRODIURIL) 25 MG tablet, Take 1 tablet by mouth daily., Disp: , Rfl:    levonorgestrel-ethinyl estradiol (LEVORA 0.15/30, 28,) 0.15-30 MG-MCG tablet, Take 1 tablet by mouth daily., Disp: , Rfl:     metroNIDAZOLE (FLAGYL) 500 MG tablet, Take by mouth., Disp: , Rfl:    neomycin-polymyxin-hydrocortisone (CORTISPORIN) 3.5-10000-1 OTIC suspension, Place 4 drops into the left ear 3 (three) times daily., Disp: 10 mL, Rfl: 0  Observations/Objective: Patient is well-developed, well-nourished in no acute distress.  Resting comfortably  at home.  Head is normocephalic, atraumatic.  No labored breathing.  Speech is clear and coherent with logical content.  Patient is alert and oriented at baseline.  Raspy voice Clearing her throat No cough  Assessment and Plan:   Mykiah Casaus in today with chief complaint of nausea and vomiting   1. Nausea and vomiting, unspecified vomiting type 2. Functional diarrhea Immodium AD OTC for diarrhea Force fluids Bland diet  3. Congestion of upper respiratory tract 4. Chills 1. Take meds as prescribed 2. Use a cool mist humidifier especially during the winter months and when heat has been humid. 3. Use saline nose sprays frequently 4. Saline irrigations of the nose can be very helpful if Escobar frequently.  * 4X daily for 1 week*  * Use of a nettie pot can be helpful with this. Follow directions with this* 5. Drink plenty of fluids 6. Keep thermostat turn down low 7.For any cough or congestion- delsym OTC if develop fever 8. For fever or aces or pains- take tylenol or ibuprofen appropriate for age and weight.  * for fevers greater than 101 orally you may alternate ibuprofen and tylenol every  3 hours.   Need to get covid test Escobar    Follow Up Instructions: I discussed the assessment and treatment plan with the patient. The patient was provided an opportunity to ask questions and all were answered. The patient agreed with the plan and demonstrated an understanding of the instructions.  A copy of instructions were sent to the patient via MyChart.  The patient was advised to call back or seek an in-person evaluation if the symptoms worsen or if the  condition fails to improve as anticipated.  Time:  I spent 9 minutes with the patient via telehealth technology discussing the above problems/concerns.    Alexandra Hassell Done, FNP

## 2021-08-05 DIAGNOSIS — F3181 Bipolar II disorder: Secondary | ICD-10-CM | POA: Diagnosis not present

## 2021-08-06 DIAGNOSIS — E559 Vitamin D deficiency, unspecified: Secondary | ICD-10-CM | POA: Insufficient documentation

## 2021-08-07 DIAGNOSIS — R030 Elevated blood-pressure reading, without diagnosis of hypertension: Secondary | ICD-10-CM | POA: Diagnosis not present

## 2021-08-07 DIAGNOSIS — Z6835 Body mass index (BMI) 35.0-35.9, adult: Secondary | ICD-10-CM | POA: Diagnosis not present

## 2021-08-07 DIAGNOSIS — D72829 Elevated white blood cell count, unspecified: Secondary | ICD-10-CM | POA: Diagnosis not present

## 2021-08-07 DIAGNOSIS — N390 Urinary tract infection, site not specified: Secondary | ICD-10-CM | POA: Diagnosis not present

## 2021-08-07 DIAGNOSIS — R945 Abnormal results of liver function studies: Secondary | ICD-10-CM | POA: Diagnosis not present

## 2021-08-07 DIAGNOSIS — R7989 Other specified abnormal findings of blood chemistry: Secondary | ICD-10-CM | POA: Diagnosis not present

## 2021-08-14 DIAGNOSIS — M542 Cervicalgia: Secondary | ICD-10-CM | POA: Diagnosis not present

## 2021-08-14 DIAGNOSIS — G4719 Other hypersomnia: Secondary | ICD-10-CM | POA: Diagnosis not present

## 2021-08-14 DIAGNOSIS — I10 Essential (primary) hypertension: Secondary | ICD-10-CM | POA: Diagnosis not present

## 2021-08-14 DIAGNOSIS — Z6835 Body mass index (BMI) 35.0-35.9, adult: Secondary | ICD-10-CM | POA: Diagnosis not present

## 2021-08-14 DIAGNOSIS — R030 Elevated blood-pressure reading, without diagnosis of hypertension: Secondary | ICD-10-CM | POA: Diagnosis not present

## 2021-08-14 DIAGNOSIS — R072 Precordial pain: Secondary | ICD-10-CM | POA: Diagnosis not present

## 2021-08-15 DIAGNOSIS — F410 Panic disorder [episodic paroxysmal anxiety] without agoraphobia: Secondary | ICD-10-CM | POA: Diagnosis not present

## 2021-08-15 DIAGNOSIS — F3181 Bipolar II disorder: Secondary | ICD-10-CM | POA: Diagnosis not present

## 2021-08-15 DIAGNOSIS — F430 Acute stress reaction: Secondary | ICD-10-CM | POA: Diagnosis not present

## 2021-08-15 DIAGNOSIS — F4312 Post-traumatic stress disorder, chronic: Secondary | ICD-10-CM | POA: Diagnosis not present

## 2021-08-16 DIAGNOSIS — F332 Major depressive disorder, recurrent severe without psychotic features: Secondary | ICD-10-CM | POA: Diagnosis not present

## 2021-09-02 ENCOUNTER — Emergency Department (HOSPITAL_BASED_OUTPATIENT_CLINIC_OR_DEPARTMENT_OTHER): Payer: BC Managed Care – PPO

## 2021-09-02 ENCOUNTER — Other Ambulatory Visit: Payer: Self-pay

## 2021-09-02 ENCOUNTER — Encounter (HOSPITAL_BASED_OUTPATIENT_CLINIC_OR_DEPARTMENT_OTHER): Payer: Self-pay | Admitting: *Deleted

## 2021-09-02 ENCOUNTER — Emergency Department (HOSPITAL_BASED_OUTPATIENT_CLINIC_OR_DEPARTMENT_OTHER)
Admission: EM | Admit: 2021-09-02 | Discharge: 2021-09-02 | Disposition: A | Discharge status: Home / Self Care | Payer: BC Managed Care – PPO | Attending: Emergency Medicine | Admitting: Emergency Medicine

## 2021-09-02 DIAGNOSIS — M7989 Other specified soft tissue disorders: Secondary | ICD-10-CM | POA: Insufficient documentation

## 2021-09-02 DIAGNOSIS — R0789 Other chest pain: Secondary | ICD-10-CM | POA: Insufficient documentation

## 2021-09-02 DIAGNOSIS — R7989 Other specified abnormal findings of blood chemistry: Secondary | ICD-10-CM | POA: Diagnosis not present

## 2021-09-02 DIAGNOSIS — I10 Essential (primary) hypertension: Secondary | ICD-10-CM | POA: Diagnosis not present

## 2021-09-02 DIAGNOSIS — R079 Chest pain, unspecified: Secondary | ICD-10-CM | POA: Diagnosis not present

## 2021-09-02 DIAGNOSIS — R0602 Shortness of breath: Secondary | ICD-10-CM | POA: Diagnosis not present

## 2021-09-02 DIAGNOSIS — Z79899 Other long term (current) drug therapy: Secondary | ICD-10-CM | POA: Insufficient documentation

## 2021-09-02 LAB — CBC
HCT: 40.9 % (ref 36.0–46.0)
Hemoglobin: 14.1 g/dL (ref 12.0–15.0)
MCH: 30.8 pg (ref 26.0–34.0)
MCHC: 34.5 g/dL (ref 30.0–36.0)
MCV: 89.3 fL (ref 80.0–100.0)
Platelets: 318 10*3/uL (ref 150–400)
RBC: 4.58 MIL/uL (ref 3.87–5.11)
RDW: 13.9 % (ref 11.5–15.5)
WBC: 10.1 10*3/uL (ref 4.0–10.5)
nRBC: 0 % (ref 0.0–0.2)

## 2021-09-02 LAB — TROPONIN I (HIGH SENSITIVITY)
Troponin I (High Sensitivity): 3 ng/L (ref <18)
Troponin I (High Sensitivity): 3 ng/L

## 2021-09-02 LAB — BASIC METABOLIC PANEL
Anion gap: 10 (ref 5–15)
BUN: 11 mg/dL (ref 6–20)
CO2: 21 mmol/L — ABNORMAL LOW (ref 22–32)
Calcium: 9.1 mg/dL (ref 8.9–10.3)
Chloride: 106 mmol/L (ref 98–111)
Creatinine, Ser: 0.73 mg/dL (ref 0.44–1.00)
GFR, Estimated: >60 mL/min (ref >60)
Glucose, Bld: 97 mg/dL (ref 70–99)
Potassium: 3.8 mmol/L (ref 3.5–5.1)
Sodium: 137 mmol/L (ref 135–145)

## 2021-09-02 LAB — D-DIMER, QUANTITATIVE: D-Dimer, Quant: 0.6 ug/mL-FEU — ABNORMAL HIGH (ref 0.00–0.50)

## 2021-09-02 MED ORDER — IOHEXOL 350 MG/ML SOLN
100.0000 mL | Freq: Once | INTRAVENOUS | Status: AC | PRN
  Administered 2021-09-02: 75 mL via INTRAVENOUS

## 2021-09-02 NOTE — ED Triage Notes (Signed)
Having chest pain, approx onset was 30 min ago. Sitting in a meeting when pain began, states it was sudden onset of pressure

## 2021-09-02 NOTE — ED Notes (Signed)
Attempted to obtain labs without success

## 2021-09-02 NOTE — ED Provider Notes (Signed)
[facility] Provider Note   CSN: [id number] Arrival date & time: 09/02/21  [id number]     History  Chief Complaint  Patient presents with   Chest Pain    Alexandra Escobar is a 41 y.o. female with a past medical history of hypertension presenting today due to acute onset chest pain.  Patient reports that all day yesterday she was feeling short of breath, worse while she was working out and this morning while she was sitting at her desk she felt sharp pain in the left side of her chest.  Worse with a deep breath.  Says that she has noted bilateral lower extremity swelling.  Is on estrogen OCP and reports that she was hospitalized for a D&C at the end of December.  Concern for blood clot.  No history of ACS.  No cough.  Pain is worse with some movements however she is unable to pinpoint which.   Home Medications Prior to Admission medications   Medication Sig Start Date End Date Taking? Authorizing Provider  acetaminophen (TYLENOL) 500 MG tablet Take by mouth. 06/06/19   [provider]  albuterol (VENTOLIN HFA) 108 (90 Base) MCG/ACT inhaler Inhale into the lungs. 06/06/19   [provider]  ALPRAZolam Duanne Moron) 0.5 MG tablet Take by mouth. 03/08/15   [provider]  amitriptyline (ELAVIL) 25 MG tablet  10/04/15   [provider]  amLODipine (NORVASC) 5 MG tablet Take by mouth.    [provider]  buPROPion (WELLBUTRIN XL) 300 MG 24 hr tablet Take 1 tablet by mouth daily. 12/18/07   [provider]  clonazePAM (KLONOPIN) 1 MG tablet Take 0.5-1 mg by mouth daily as needed. 01/23/21   [provider]  escitalopram (LEXAPRO) 20 MG tablet Take 20 mg by mouth daily. 01/19/21   [provider]  hydrochlorothiazide (HYDRODIURIL) 25 MG tablet Take 1 tablet by mouth daily. 02/12/16   [provider]  levonorgestrel-ethinyl estradiol (LEVORA 0.15/30, 28,) 0.15-30 MG-MCG tablet Take 1 tablet by mouth daily.  03/21/15   [provider]  metroNIDAZOLE (FLAGYL) 500 MG tablet Take by mouth. 05/24/16   [provider]  neomycin-polymyxin-hydrocortisone (CORTISPORIN) 3.5-10000-1 OTIC suspension Place 4 drops into the left ear 3 (three) times daily. 07/05/21   Damyen Knoll A, PA-C      Allergies    Patient has no known allergies.    Review of Systems   Review of Systems  Cardiovascular:  Positive for chest pain.  See HPI  Physical Exam Updated Vital Signs BP (!) 143/99 (BP Location: Right Arm)    Pulse 70    Temp 97.6 F (36.4 C) (Oral)    Resp 15    Ht 5\' 2"  (1.575 m)    Wt 86.2 kg    LMP 08/26/2021    SpO2 96%    Breastfeeding Unknown    BMI 34.75 kg/m  Physical Exam Vitals and nursing note reviewed.  Constitutional:      General: She is not in acute distress.    Appearance: Normal appearance. She is not ill-appearing.  HENT:     Head: Normocephalic and atraumatic.  Eyes:     General: No scleral icterus.    Conjunctiva/sclera: Conjunctivae normal.  Cardiovascular:     Rate and Rhythm: Normal rate and regular rhythm.  Pulmonary:     Effort: Pulmonary effort is normal. No respiratory distress.     Breath sounds: Normal breath sounds.  Chest:     Chest wall:  Tenderness (Left-sided reproducible tenderness.  Worse with flexion of the left shoulder) present.  Musculoskeletal:     Right lower leg: No tenderness. No edema.     Left lower leg: No tenderness. No edema.  Skin:    General: Skin is warm and dry.     Findings: No rash.  Neurological:     Mental Status: She is alert.  Psychiatric:        Mood and Affect: Mood normal.    ED Results / Procedures / Treatments   Labs (all labs ordered are listed, but only abnormal results are displayed) Labs Reviewed  BASIC METABOLIC PANEL - Abnormal; Notable for the following components:      Result Value   CO2 21 (*)    All other components within normal limits  D-DIMER, QUANTITATIVE - Abnormal; Notable for the  following components:   D-Dimer, Quant 0.60 (*)    All other components within normal limits  CBC  TROPONIN I (HIGH SENSITIVITY)  TROPONIN I (HIGH SENSITIVITY)    EKG None  Radiology DG Chest 2 View  Result Date: 09/02/2021 CLINICAL DATA:  Cp, lightheaded that started this am. EXAM: CHEST - 2 VIEW COMPARISON:  none FINDINGS: Lungs are clear. Heart size and mediastinal contours are within normal limits. No pneumothorax.  No pleural effusion. Visualized bones unremarkable. IMPRESSION: No acute cardiopulmonary disease. Electronically Signed   By: Lucrezia Europe M.D.   On: 09/02/2021 12:29   CT Angio Chest PE W and/or Wo Contrast  Result Date: 09/02/2021 CLINICAL DATA:  Chest pain, positive D-dimer EXAM: CT ANGIOGRAPHY CHEST WITH CONTRAST TECHNIQUE: Multidetector CT imaging of the chest was performed using the standard protocol during bolus administration of intravenous contrast. Multiplanar CT image reconstructions and MIPs were obtained to evaluate the vascular anatomy. RADIATION DOSE REDUCTION: This exam was performed according to the departmental dose-optimization program which includes automated exposure control, adjustment of the mA and/or kV according to patient size and/or use of iterative reconstruction technique. CONTRAST:  6mL OMNIPAQUE IOHEXOL 350 MG/ML SOLN COMPARISON:  09/02/2021 chest x-ray FINDINGS: Cardiovascular: Pulmonary arteries appear patent and normal in caliber. Negative for significant acute filling defect or pulmonary embolus by CTA. Intact thoracic aorta. Patent 3 vessel arch anatomy. No aneurysm or dissection. No mediastinal hemorrhage or hematoma. Normal heart size. No pericardial effusion. Central venous structures are patent.  No veno-occlusive process. Mediastinum/Nodes: No enlarged mediastinal, hilar, or axillary lymph nodes. Thyroid gland, trachea, and esophagus demonstrate no significant findings. Lungs/Pleura: Lungs are clear. No pleural effusion or pneumothorax. Upper  Abdomen: No acute abnormality. Musculoskeletal: No chest wall abnormality. No acute or significant osseous findings. Review of the MIP images confirms the above findings. IMPRESSION: Negative for significant acute pulmonary embolus by CTA. No other acute intrathoracic finding. Electronically Signed   By: Jerilynn Mages.  Shick M.D.   On: 09/02/2021 14:52    Procedures Procedures  {Patient remains in normal sinus rhythm with a normal rate on the monitor Medications Ordered in ED Medications - No data to display  ED Course/ Medical Decision Making/ A&P                           Medical Decision Making Amount and/or Complexity of Data Reviewed Labs: ordered. Radiology: ordered.  Risk Prescription drug management.   42 year old female presenting with a complaint of chest pain that started today.  Also endorses some shortness of breath yesterday afternoon. The emergent differential diagnosis of chest pain includes: Acute  coronary syndrome, pericarditis, aortic dissection, pulmonary embolism, tension pneumothorax, and esophageal rupture.  Co morbidities that complicate the patient evaluation include: N/A  Additional history obtained from internal and external records: N/A   Workup:  Lab Tests:  I Ordered, and personally interpreted labs.   The pertinent results include:  D dimer 0.6  2. Imaging Studies ordered:  Chest x-ray individually reviewed by me, within normal limits.  CT angio also ordered and interpreted by myself.  I agree with radiologist that there are no signs of PE.  3. Cardiac Monitoring:  The patient was maintained on a cardiac monitor.  I personally viewed and interpreted the cardiac monitored which showed normal sinus rhythm with a normal rate  4. Consultations Obtained:  I considered consult with cardiology however patient is stable to follow-up outpatient at this time.  I suspect her symptoms to be musculoskeletal as opposed to cardiac however she would like to have  cardiologist on board.  Treatment:  Medications:  I ordered medication including patient denied any current pain, no medications were ordered.  Dispo:  Problem List / ED Course:  Chest wall pain.  I believe her pain to be musculoskeletal, reproducible and worse with flexion of the shoulder.   Dispostion:  After the interventions noted above, I reevaluated the patient and found that they continue to be asymptomatic.  After consideration of the diagnostic results and the patients response to treatment, I feel she would benefit from NSAID and Tylenol treatment and outpatient follow-up. Heart score of 0.  I do not believe a cardiologist referral is necessary however patient is concerned due to family history of heart disease and her hypertension.  Referral placed.  She is agreeable to this plan. To be discharged in good condition.  Final Clinical Impression(s) / ED Diagnoses Final diagnoses:  Chest wall pain    Rx / DC Orders Results and diagnoses were explained to the patient. Return precautions discussed in full. Patient had no additional questions and expressed complete understanding.   This chart was dictated using voice recognition software.  Despite best efforts to proofread,  errors can occur which can change the documentation meaning.      Rhae Hammock, PA-C 09/02/21 1725    Lucrezia Starch, MD 09/04/21 [phone]

## 2021-09-02 NOTE — Discharge Instructions (Addendum)
I believe the pain you are experiencing is due to your muscles.  There are no signs of heart attack or blood clot in your lungs.  Please follow-up with the cardiologist attached to these discharge papers if you would like further evaluation.  You may take Tylenol and ibuprofen for your discomfort.  Heat packs may also assist in any muscle tension or discomfort.  Return with any sharp pains, difficulty breathing, dizziness or episodes of passing out.  Also, please come back with any further concerns.  Read the information about chest wall pain attached to these discharge papers.

## 2021-09-09 DIAGNOSIS — F332 Major depressive disorder, recurrent severe without psychotic features: Secondary | ICD-10-CM | POA: Diagnosis not present

## 2021-09-11 DIAGNOSIS — F410 Panic disorder [episodic paroxysmal anxiety] without agoraphobia: Secondary | ICD-10-CM | POA: Diagnosis not present

## 2021-09-11 DIAGNOSIS — F3181 Bipolar II disorder: Secondary | ICD-10-CM | POA: Diagnosis not present

## 2021-09-11 DIAGNOSIS — F430 Acute stress reaction: Secondary | ICD-10-CM | POA: Diagnosis not present

## 2021-09-11 DIAGNOSIS — F4312 Post-traumatic stress disorder, chronic: Secondary | ICD-10-CM | POA: Diagnosis not present

## 2021-09-13 DIAGNOSIS — R945 Abnormal results of liver function studies: Secondary | ICD-10-CM | POA: Diagnosis not present

## 2021-09-13 DIAGNOSIS — Z6836 Body mass index (BMI) 36.0-36.9, adult: Secondary | ICD-10-CM | POA: Diagnosis not present

## 2021-09-13 DIAGNOSIS — Z09 Encounter for follow-up examination after completed treatment for conditions other than malignant neoplasm: Secondary | ICD-10-CM | POA: Diagnosis not present

## 2021-09-13 DIAGNOSIS — E559 Vitamin D deficiency, unspecified: Secondary | ICD-10-CM | POA: Diagnosis not present

## 2021-09-13 DIAGNOSIS — I10 Essential (primary) hypertension: Secondary | ICD-10-CM | POA: Diagnosis not present

## 2021-09-13 DIAGNOSIS — R072 Precordial pain: Secondary | ICD-10-CM | POA: Diagnosis not present

## 2021-09-13 DIAGNOSIS — R5383 Other fatigue: Secondary | ICD-10-CM | POA: Diagnosis not present

## 2021-09-13 DIAGNOSIS — E669 Obesity, unspecified: Secondary | ICD-10-CM | POA: Diagnosis not present

## 2021-09-13 DIAGNOSIS — Z20822 Contact with and (suspected) exposure to covid-19: Secondary | ICD-10-CM | POA: Diagnosis not present

## 2021-09-13 DIAGNOSIS — E539 Vitamin B deficiency, unspecified: Secondary | ICD-10-CM | POA: Diagnosis not present

## 2021-09-13 DIAGNOSIS — Z79899 Other long term (current) drug therapy: Secondary | ICD-10-CM | POA: Diagnosis not present

## 2021-09-19 ENCOUNTER — Other Ambulatory Visit: Payer: Self-pay

## 2021-09-19 ENCOUNTER — Ambulatory Visit (INDEPENDENT_AMBULATORY_CARE_PROVIDER_SITE_OTHER): Payer: BC Managed Care – PPO | Admitting: Internal Medicine

## 2021-09-19 VITALS — BP 130/80 | HR 81 | Ht 62.0 in | Wt 197.2 lb

## 2021-09-19 DIAGNOSIS — R0789 Other chest pain: Secondary | ICD-10-CM | POA: Diagnosis not present

## 2021-09-19 NOTE — Patient Instructions (Signed)
Medication Instructions:  No Changes In Medications at this time.  *If you need a refill on your cardiac medications before your next appointment, please call your pharmacy*  Follow-Up: At [facility], you and your health needs are our priority.  As part of our continuing mission to provide you with exceptional heart care, we have created designated Provider Care Teams.  These Care Teams include your primary Cardiologist (physician) and Advanced Practice Providers (APPs -  Physician Assistants and Nurse Practitioners) who all work together to provide you with the care you need, when you need it.  Your next appointment:   6 month(s)  The format for your next appointment:   In Person  Provider:   Branch, Mary E, MD   

## 2021-09-19 NOTE — Progress Notes (Signed)
?Cardiology Office Note:   ? ?Date:  09/19/2021  ? ?ID:  Alexandra Escobar, DOB 1980/08/23, MRN [id number] ? ?PCP:  Pcp, No ?  ?[facility] Providers ?Cardiologist:  Maisie Fus, MD    ? ?Referring MD: No ref. provider found  ? ?No chief complaint on file. ?Atypical Chest pain ? ?History of Present Illness:   ? ?Alexandra Escobar is a 41 y.o. female with a hx of htn, referral for chest pain ? ?Went to the ED 09/02/21. She felt SOB while working out and sharp left chest pain. Cardiology was not consulted considering she had no signs of cardiac disease. A d-dimer was done and her CT scan shoed no PE. Toponin was negative. EKG was normal ? ?She comes in today, She notes a tight not with breathing. She felt a chest knot with breathing.  She notes that she has some L arm and leg swelling. She gets L arm tingling. Her arm feels heavy. This happens with just sitting. She notes it gets worse with sleeping on it. Leg swelling with sitting a lot at work. ? ?She notes blood pressures have been in the 150s. She's on antihypertensives. She was pregnant and then had a miscarriage recently. She just recently started seeing a PCP in [facility]. ? ?Family Hx- father has htn.  ? ?Social Hx: no smoking. No drug use ? ? ?Past Medical History:  ?Diagnosis Date  ? Hypertension   ? ? ?No past surgical history on file. ? ?Current Medications: ?Current Outpatient Medications on File Prior to Visit  ?Medication Sig Dispense Refill  ? AUVELITY 45-105 MG TBCR Take 1 tablet by mouth daily.    ? labetalol (NORMODYNE) 200 MG tablet Take 200 mg by mouth 2 (two) times daily.    ? lamoTRIgine (LAMICTAL) 25 MG tablet Take 50 mg by mouth 2 (two) times daily.    ? ?No current facility-administered medications on file prior to visit.  ? ? ? ?Allergies:   Patient has no known allergies.  ? ?Social History  ? ?Socioeconomic History  ? Marital status: Married  ?  Spouse name: Not on file  ? Number of children: Not on file  ? Years of education: Not on file  ?  Highest education level: Not on file  ?Occupational History  ? Not on file  ?Tobacco Use  ? Smoking status: Never  ? Smokeless tobacco: Never  ?Substance and Sexual Activity  ? Alcohol use: Never  ? Drug use: Never  ? Sexual activity: Never  ?Other Topics Concern  ? Not on file  ?Social History Narrative  ? Not on file  ? ?Social Determinants of Health  ? ?Financial Resource Strain: Not on file  ?Food Insecurity: Not on file  ?Transportation Needs: Not on file  ?Physical Activity: Not on file  ?Stress: Not on file  ?Social Connections: Not on file  ?  ? ?Family History: ?The patient's per family hx ? ?ROS:   ?Please see the history of present illness.    ? All other systems reviewed and are negative. ? ?EKGs/Labs/Other Studies Reviewed:   ? ?The following studies were reviewed today: ? ? ?EKG:  EKG is  ordered today.  The ekg ordered today demonstrates  ? ?NSR ? ?Recent Labs: ?09/02/2021: BUN 11; Creatinine, Ser 0.73; Hemoglobin 14.1; Platelets 318; Potassium 3.8; Sodium 137  ?Recent Lipid Panel ?No results found for: CHOL, TRIG, HDL, CHOLHDL, VLDL, LDLCALC, LDLDIRECT ? ? ?Risk Assessment/Calculations:   ?  ? ?    ? ?  Physical Exam:   ? ?VS:  BP 130/80 (BP Location: Left Arm, Patient Position: Sitting)   Pulse 81   Ht 5\' 2"  (1.575 m)   Wt 197 lb 3.2 oz (89.4 kg)   LMP 08/26/2021   SpO2 97%   BMI 36.07 kg/m?    ? ?Wt Readings from Last 3 Encounters:  ?09/19/21 197 lb 3.2 oz (89.4 kg)  ?09/02/21 190 lb (86.2 kg)  ?07/05/21 189 lb (85.7 kg)  ?  ? ?GEN:  Well nourished, well developed in no acute distress ?HEENT: Normal ?NECK: No JVD; No carotid bruits ?LYMPHATICS: No lymphadenopathy ?CARDIAC: RRR, no murmurs, rubs, gallops ?RESPIRATORY:  Clear to auscultation without rales, wheezing or rhonchi  ?ABDOMEN: Soft, non-tender, non-distended ?MUSCULOSKELETAL:  No edema; No deformity  ?SKIN: Warm and dry ?NEUROLOGIC:  Alert and oriented x 3 ?PSYCHIATRIC:  Normal affect  ? ?ASSESSMENT:   ? ? ?HTN:  recently had a  miscarriage and under stress. Can continue labetalol for now since she is considering pregnancy again. Can see her 6 months. If her blood pressure is not well controlled on medications, can consider secondary causes. ?- continue labetalol 200 mg BID ? ?Atypical Chest pain: Her L arm heaviness and tingling are atypical for angina. She is low risk for CVD. Her EKG is normal. She has no signs of ischemia.Her swelling is transient. She has no signs of heart failure. ? ?PLAN:   ? ?In order of problems listed above: ? ? ?No changes ?Follow up 6 months ? ?   ? ?   ?Medication Adjustments/Labs and Tests Ordered: ?Current medicines are reviewed at length with the patient today.  Concerns regarding medicines are outlined above.  ?Orders Placed This Encounter  ?Procedures  ? EKG 12-Lead  ? ?No orders of the defined types were placed in this encounter. ? ? ?Patient Instructions  ?Medication Instructions:  ?No Changes In Medications at this time.  ?*If you need a refill on your cardiac medications before your next appointment, please call your pharmacy* ? ?Follow-Up: ?At [facility], you and your health needs are our priority.  As part of our continuing mission to provide you with exceptional heart care, we have created designated Provider Care Teams.  These Care Teams include your primary Cardiologist (physician) and Advanced Practice Providers (APPs -  Physician Assistants and Nurse Practitioners) who all work together to provide you with the care you need, when you need it. ? ?Your next appointment:   ?6 month(s) ? ?The format for your next appointment:   ?In Person ? ?Provider:   ?[facility], MD   ?  ? ?Signed, ?Maisie Fus, MD  ?09/19/2021 10:27 AM    ?[facility] ?

## 2021-09-20 DIAGNOSIS — F4312 Post-traumatic stress disorder, chronic: Secondary | ICD-10-CM | POA: Diagnosis not present

## 2021-09-20 DIAGNOSIS — F410 Panic disorder [episodic paroxysmal anxiety] without agoraphobia: Secondary | ICD-10-CM | POA: Diagnosis not present

## 2021-09-20 DIAGNOSIS — F3181 Bipolar II disorder: Secondary | ICD-10-CM | POA: Diagnosis not present

## 2021-09-20 DIAGNOSIS — F430 Acute stress reaction: Secondary | ICD-10-CM | POA: Diagnosis not present

## 2021-09-23 DIAGNOSIS — F410 Panic disorder [episodic paroxysmal anxiety] without agoraphobia: Secondary | ICD-10-CM | POA: Diagnosis not present

## 2021-09-23 DIAGNOSIS — F3181 Bipolar II disorder: Secondary | ICD-10-CM | POA: Diagnosis not present

## 2021-09-25 DIAGNOSIS — R945 Abnormal results of liver function studies: Secondary | ICD-10-CM | POA: Diagnosis not present

## 2021-10-04 DIAGNOSIS — F3181 Bipolar II disorder: Secondary | ICD-10-CM | POA: Diagnosis not present

## 2021-10-11 DIAGNOSIS — F332 Major depressive disorder, recurrent severe without psychotic features: Secondary | ICD-10-CM | POA: Diagnosis not present

## 2021-10-18 DIAGNOSIS — F332 Major depressive disorder, recurrent severe without psychotic features: Secondary | ICD-10-CM | POA: Diagnosis not present

## 2021-10-18 DIAGNOSIS — F410 Panic disorder [episodic paroxysmal anxiety] without agoraphobia: Secondary | ICD-10-CM | POA: Diagnosis not present

## 2021-10-18 DIAGNOSIS — F4312 Post-traumatic stress disorder, chronic: Secondary | ICD-10-CM | POA: Diagnosis not present

## 2021-10-21 ENCOUNTER — Ambulatory Visit (INDEPENDENT_AMBULATORY_CARE_PROVIDER_SITE_OTHER): Payer: BC Managed Care – PPO

## 2021-10-21 ENCOUNTER — Encounter: Payer: Self-pay | Admitting: Internal Medicine

## 2021-10-21 ENCOUNTER — Ambulatory Visit: Payer: BC Managed Care – PPO | Admitting: Internal Medicine

## 2021-10-21 VITALS — BP 128/86 | HR 83 | Temp 98.7°F | Resp 16 | Ht 62.0 in | Wt 201.0 lb

## 2021-10-21 DIAGNOSIS — R5382 Chronic fatigue, unspecified: Secondary | ICD-10-CM | POA: Insufficient documentation

## 2021-10-21 DIAGNOSIS — E039 Hypothyroidism, unspecified: Secondary | ICD-10-CM

## 2021-10-21 DIAGNOSIS — R059 Cough, unspecified: Secondary | ICD-10-CM | POA: Diagnosis not present

## 2021-10-21 DIAGNOSIS — R058 Other specified cough: Secondary | ICD-10-CM | POA: Diagnosis not present

## 2021-10-21 DIAGNOSIS — J0100 Acute maxillary sinusitis, unspecified: Secondary | ICD-10-CM | POA: Diagnosis not present

## 2021-10-21 DIAGNOSIS — R635 Abnormal weight gain: Secondary | ICD-10-CM | POA: Diagnosis not present

## 2021-10-21 DIAGNOSIS — Z0001 Encounter for general adult medical examination with abnormal findings: Secondary | ICD-10-CM | POA: Diagnosis not present

## 2021-10-21 DIAGNOSIS — R079 Chest pain, unspecified: Secondary | ICD-10-CM | POA: Diagnosis not present

## 2021-10-21 LAB — HCG, QUANTITATIVE, PREGNANCY: Quantitative HCG: 2.46 m[IU]/mL

## 2021-10-21 LAB — POC COVID19 BINAXNOW: SARS Coronavirus 2 Ag: NEGATIVE

## 2021-10-21 MED ORDER — AMOXICILLIN-POT CLAVULANATE 875-125 MG PO TABS: 1.0000 | ORAL_TABLET | Freq: Two times a day (BID) | ORAL | 0 refills | Status: AC

## 2021-10-21 MED ORDER — HYDROCODONE BIT-HOMATROP MBR 5-1.5 MG/5ML PO SOLN: 5.0000 mL | Freq: Three times a day (TID) | ORAL | 0 refills | Status: AC | PRN

## 2021-10-21 NOTE — Patient Instructions (Signed)

## 2021-10-21 NOTE — Progress Notes (Signed)
? ?Subjective:  ?Patient ID: Alexandra Escobar, female    DOB: May 14, 1981  Age: 41 y.o. MRN: [id number] ? ?CC: Cough and Annual Exam ? ?This visit occurred during the SARS-CoV-2 public health emergency.  Safety protocols were in place, including screening questions prior to the visit, additional usage of staff PPE, and extensive cleaning of exam room while observing appropriate contact time as indicated for disinfecting solutions.   ? ?HPI ?Alexandra Escobar presents for a CPX and f/up -  ? ?She complains of weight gain and fatigue.  She has been seen elsewhere and was told that she has an abnormal thyroid level.  She also complains of a history of heart pounding and says she has been evaluated thoroughly by cardiology.  She complains of a 4-day history of sore throat, cough productive of thick yellow phlegm and thick yellow nasal phlegm.  She denies chest pain, shortness of breath, hemoptysis, fever, chills, or night sweats. ? ?History ?Alexandra Escobar has a past medical history of Fatty liver and Hypertension.  ? ?She has a past surgical history that includes Dilation and curettage of uterus (06/2021).  ? ?Her family history includes Depression in her father; Hypertension in her father, paternal grandmother, and sister.She reports that she has never smoked. She has never been exposed to tobacco smoke. She has never used smokeless tobacco. She reports that she does not drink alcohol and does not use drugs. ? ?Outpatient Medications Prior to Visit  ?Medication Sig Dispense Refill  ? AUVELITY 45-105 MG TBCR Take 1 tablet by mouth daily.    ? labetalol (NORMODYNE) 200 MG tablet Take 200 mg by mouth 2 (two) times daily.    ? lamoTRIgine (LAMICTAL) 25 MG tablet Take 50 mg by mouth 2 (two) times daily.    ? ?No facility-administered medications prior to visit.  ? ? ?ROS ?Review of Systems  ?Constitutional:  Positive for fatigue and unexpected weight change. Negative for chills and fever.  ?HENT:  Positive for postnasal drip, rhinorrhea, sinus  pressure, sinus pain and sore throat. Negative for sneezing.   ?     ++ thick, bloody, purulent nasal phlegm  ?Respiratory:  Positive for cough. Negative for chest tightness, shortness of breath and wheezing.   ?Cardiovascular:  Negative for chest pain, palpitations and leg swelling.  ?Gastrointestinal:  Negative for abdominal pain, diarrhea, nausea and vomiting.  ?Genitourinary: Negative.  Negative for difficulty urinating.  ?Musculoskeletal: Negative.   ?Skin: Negative.   ?Neurological: Negative.  Negative for dizziness, weakness, light-headedness and headaches.  ?Hematological:  Negative for adenopathy. Does not bruise/bleed easily.  ?Psychiatric/Behavioral: Negative.    ? ?Objective:  ?BP 128/86 (BP Location: Right Arm, Patient Position: Sitting, Cuff Size: Large)   Pulse 83   Temp 98.7 ?F (37.1 ?C) (Oral)   Resp 16   Ht 5\' 2"  (1.575 m)   Wt 201 lb (91.2 kg)   LMP 09/27/2021 (Exact Date)   SpO2 96%   Breastfeeding No   BMI 36.76 kg/m?  ? ?Physical Exam ?Vitals reviewed.  ?Constitutional:   ?   Appearance: She is obese. She is not ill-appearing.  ?HENT:  ?   Nose: Rhinorrhea present. Rhinorrhea is purulent.  ?   Mouth/Throat:  ?   Mouth: Mucous membranes are moist.  ?Eyes:  ?   General: No scleral icterus. ?   Conjunctiva/sclera: Conjunctivae normal.  ?Cardiovascular:  ?   Rate and Rhythm: Normal rate and regular rhythm.  ?   Heart sounds: No murmur heard. ?Pulmonary:  ?  Effort: Pulmonary effort is normal.  ?   Breath sounds: No stridor. No wheezing, rhonchi or rales.  ?Abdominal:  ?   General: Abdomen is flat.  ?   Palpations: There is no mass.  ?   Tenderness: There is no abdominal tenderness. There is no guarding.  ?   Hernia: No hernia is present.  ?Musculoskeletal:     ?   General: Normal range of motion.  ?   Cervical back: Neck supple.  ?   Right lower leg: No edema.  ?   Left lower leg: No edema.  ?Lymphadenopathy:  ?   Cervical: No cervical adenopathy.  ?Skin: ?   General: Skin is warm and  dry.  ?Neurological:  ?   General: No focal deficit present.  ?   Mental Status: She is alert.  ?Psychiatric:     ?   Mood and Affect: Mood normal.     ?   Behavior: Behavior normal.  ? ? ?Lab Results  ?Component Value Date  ? WBC 10.1 09/02/2021  ? HGB 14.1 09/02/2021  ? HCT 40.9 09/02/2021  ? PLT 318 09/02/2021  ? GLUCOSE 97 09/02/2021  ? NA 137 09/02/2021  ? K 3.8 09/02/2021  ? CL 106 09/02/2021  ? CREATININE 0.73 09/02/2021  ? BUN 11 09/02/2021  ? CO2 21 (L) 09/02/2021  ? TSH 5.53 (H) 10/21/2021  ?  ?DG Chest 2 View ? ?Result Date: 10/21/2021 ?CLINICAL DATA:  Cough, chest pain EXAM: CHEST - 2 VIEW COMPARISON:  09/02/2021 FINDINGS: The heart size and mediastinal contours are within normal limits. Both lungs are clear. The visualized skeletal structures are unremarkable. IMPRESSION: No active cardiopulmonary disease. Electronically Signed   By: Duanne GuessNicholas  Plundo D.O.   On: 10/21/2021 09:08  ? ?DG Chest 2 View ? ?Result Date: 09/02/2021 ?CLINICAL DATA:  Cp, lightheaded that started this am. EXAM: CHEST - 2 VIEW COMPARISON:  none FINDINGS: Lungs are clear. Heart size and mediastinal contours are within normal limits. No pneumothorax.  No pleural effusion. Visualized bones unremarkable. IMPRESSION: No acute cardiopulmonary disease. Electronically Signed   By: Corlis Leak  Hassell M.D.   On: 09/02/2021 12:29  ? ?CT Angio Chest PE W and/or Wo Contrast ? ?Result Date: 09/02/2021 ?CLINICAL DATA:  Chest pain, positive D-dimer EXAM: CT ANGIOGRAPHY CHEST WITH CONTRAST TECHNIQUE: Multidetector CT imaging of the chest was performed using the standard protocol during bolus administration of intravenous contrast. Multiplanar CT image reconstructions and MIPs were obtained to evaluate the vascular anatomy. RADIATION DOSE REDUCTION: This exam was performed according to the departmental dose-optimization program which includes automated exposure control, adjustment of the mA and/or kV according to patient size and/or use of iterative  reconstruction technique. CONTRAST:  75mL OMNIPAQUE IOHEXOL 350 MG/ML SOLN COMPARISON:  09/02/2021 chest x-ray FINDINGS: Cardiovascular: Pulmonary arteries appear patent and normal in caliber. Negative for significant acute filling defect or pulmonary embolus by CTA. Intact thoracic aorta. Patent 3 vessel arch anatomy. No aneurysm or dissection. No mediastinal hemorrhage or hematoma. Normal heart size. No pericardial effusion. Central venous structures are patent.  No veno-occlusive process. Mediastinum/Nodes: No enlarged mediastinal, hilar, or axillary lymph nodes. Thyroid gland, trachea, and esophagus demonstrate no significant findings. Lungs/Pleura: Lungs are clear. No pleural effusion or pneumothorax. Upper Abdomen: No acute abnormality. Musculoskeletal: No chest wall abnormality. No acute or significant osseous findings. Review of the MIP images confirms the above findings. IMPRESSION: Negative for significant acute pulmonary embolus by CTA. No other acute intrathoracic finding. Electronically Signed  By: Osvaldo Shipper M.D.   On: 09/02/2021 14:52    ? ?Assessment & Plan:  ? ?Alexandra Escobar was seen today for cough and annual exam. ? ?Diagnoses and all orders for this visit: ? ?Encounter for general adult medical examination with abnormal findings- Exam completed, vaccines are up-to-date, cancer screenings are up-to-date, patient education was given. ?-     hCG, quantitative, pregnancy; Future ?-     hCG, quantitative, pregnancy ? ?Productive cough- Her chest x-ray is negative for mass or infiltrate. ?-     hCG, quantitative, pregnancy; Future ?-     DG Chest 2 View; Future ?-     hCG, quantitative, pregnancy ?-     POC COVID-19 ? ?Chronic fatigue ?-     Thyroid Panel With TSH; Future ?-     hCG, quantitative, pregnancy; Future ?-     hCG, quantitative, pregnancy ?-     Thyroid Panel With TSH ? ?Weight gain, abnormal ?-     Thyroid Panel With TSH; Future ?-     hCG, quantitative, pregnancy; Future ?-     hCG, quantitative,  pregnancy ?-     Thyroid Panel With TSH ? ?Acute non-recurrent maxillary sinusitis ?-     HYDROcodone bit-homatropine (HYCODAN) 5-1.5 MG/5ML syrup; Take 5 mLs by mouth every 8 (eight) hours as needed for up to 8 days for c

## 2021-10-22 DIAGNOSIS — F430 Acute stress reaction: Secondary | ICD-10-CM | POA: Diagnosis not present

## 2021-10-22 DIAGNOSIS — F3181 Bipolar II disorder: Secondary | ICD-10-CM | POA: Diagnosis not present

## 2021-10-22 DIAGNOSIS — F4312 Post-traumatic stress disorder, chronic: Secondary | ICD-10-CM | POA: Diagnosis not present

## 2021-10-22 DIAGNOSIS — E039 Hypothyroidism, unspecified: Secondary | ICD-10-CM | POA: Insufficient documentation

## 2021-10-22 LAB — THYROID PANEL WITH TSH
Free Thyroxine Index: 2 (ref 1.4–3.8)
T3 Uptake: 31 % (ref 22–35)
T4, Total: 6.3 ug/dL (ref 5.1–11.9)
TSH: 5.53 mIU/L — ABNORMAL HIGH

## 2021-10-22 MED ORDER — LEVOTHYROXINE SODIUM 25 MCG PO TABS: 25.0000 ug | ORAL_TABLET | Freq: Every day | ORAL | 0 refills | Status: DC

## 2021-10-26 ENCOUNTER — Encounter: Payer: Self-pay | Admitting: Internal Medicine

## 2021-10-28 ENCOUNTER — Ambulatory Visit: Payer: BC Managed Care – PPO | Admitting: Podiatry

## 2021-10-28 DIAGNOSIS — L600 Ingrowing nail: Secondary | ICD-10-CM | POA: Diagnosis not present

## 2021-10-28 NOTE — Patient Instructions (Signed)
Look for urea 40% gel, cream or ointment and apply to the thickened dry skin / calluses. This can be bought over the counter, at a pharmacy or online such as [employer]. The gel will work best for the nails ? ?

## 2021-10-29 NOTE — Progress Notes (Signed)
?  Subjective:  ?Patient ID: Alexandra Escobar, female    DOB: [birth year],  MRN: [id number] ? ?Chief Complaint  ?Patient presents with  ? Nail Problem  ?   RFC nail trim- trying to prevent ingrown nail  ? ? ?41 y.o. female returns for follow-up with the above complaint. History confirmed with patient.  She states that overall the toenails are doing okay but she has had some pain along both sides especially the right lateral side ? ?Objective:  ?Physical Exam: ?warm, good capillary refill, no trophic changes or ulcerative lesions, normal DP and PT pulses, and normal sensory exam. ?Bilateral hallux there is no regrowth of ingrown nail or recurrence, there is some rough skin on the lateral edge and transverse Beau's lines in the mid plate of the nail with slight splitting here ? ?Assessment:  ? ?1. Ingrowing right great toenail   ?2. Ingrowing left great toenail   ? ? ? ? ?Plan:  ?Patient was evaluated and treated and all questions answered. ? ? ? ? ?Ingrown Nail, bilaterally ?-Discussed with her appears to be sequela of the procedure itself.  I do not think she has recurrence of ingrown nail.  There is some rough skin that is causing some tenderness here as well.  I recommended softening it with a cream or ointment or gel such as urea 40%.  She will work on this and return to see me as needed. ?

## 2021-11-01 DIAGNOSIS — F4312 Post-traumatic stress disorder, chronic: Secondary | ICD-10-CM | POA: Diagnosis not present

## 2021-11-01 DIAGNOSIS — F410 Panic disorder [episodic paroxysmal anxiety] without agoraphobia: Secondary | ICD-10-CM | POA: Diagnosis not present

## 2021-11-01 DIAGNOSIS — F332 Major depressive disorder, recurrent severe without psychotic features: Secondary | ICD-10-CM | POA: Diagnosis not present

## 2021-11-05 DIAGNOSIS — Z3169 Encounter for other general counseling and advice on procreation: Secondary | ICD-10-CM | POA: Diagnosis not present

## 2021-11-05 DIAGNOSIS — E0590 Thyrotoxicosis, unspecified without thyrotoxic crisis or storm: Secondary | ICD-10-CM | POA: Insufficient documentation

## 2021-11-08 DIAGNOSIS — F3181 Bipolar II disorder: Secondary | ICD-10-CM | POA: Diagnosis not present

## 2021-11-08 DIAGNOSIS — F430 Acute stress reaction: Secondary | ICD-10-CM | POA: Diagnosis not present

## 2021-11-08 DIAGNOSIS — F4312 Post-traumatic stress disorder, chronic: Secondary | ICD-10-CM | POA: Diagnosis not present

## 2021-11-17 ENCOUNTER — Other Ambulatory Visit: Payer: Self-pay | Admitting: Internal Medicine

## 2021-11-18 ENCOUNTER — Other Ambulatory Visit: Payer: Self-pay | Admitting: Internal Medicine

## 2021-11-18 DIAGNOSIS — I10 Essential (primary) hypertension: Secondary | ICD-10-CM

## 2021-11-18 MED ORDER — LABETALOL HCL 200 MG PO TABS: 200.0000 mg | ORAL_TABLET | Freq: Two times a day (BID) | ORAL | 0 refills | Status: DC

## 2021-11-26 DIAGNOSIS — F4312 Post-traumatic stress disorder, chronic: Secondary | ICD-10-CM | POA: Diagnosis not present

## 2021-11-26 DIAGNOSIS — F332 Major depressive disorder, recurrent severe without psychotic features: Secondary | ICD-10-CM | POA: Diagnosis not present

## 2021-11-28 DIAGNOSIS — F3181 Bipolar II disorder: Secondary | ICD-10-CM | POA: Diagnosis not present

## 2021-11-28 DIAGNOSIS — F410 Panic disorder [episodic paroxysmal anxiety] without agoraphobia: Secondary | ICD-10-CM | POA: Diagnosis not present

## 2021-11-28 DIAGNOSIS — F4312 Post-traumatic stress disorder, chronic: Secondary | ICD-10-CM | POA: Diagnosis not present

## 2021-11-29 DIAGNOSIS — L918 Other hypertrophic disorders of the skin: Secondary | ICD-10-CM | POA: Diagnosis not present

## 2021-11-29 DIAGNOSIS — D225 Melanocytic nevi of trunk: Secondary | ICD-10-CM | POA: Diagnosis not present

## 2021-11-29 DIAGNOSIS — D229 Melanocytic nevi, unspecified: Secondary | ICD-10-CM | POA: Diagnosis not present

## 2021-12-18 ENCOUNTER — Ambulatory Visit: Payer: BC Managed Care – PPO | Admitting: Internal Medicine

## 2021-12-18 ENCOUNTER — Encounter: Payer: Self-pay | Admitting: Internal Medicine

## 2021-12-18 VITALS — BP 128/86 | HR 79 | Temp 98.5°F | Resp 16 | Ht 62.0 in | Wt 205.0 lb

## 2021-12-18 DIAGNOSIS — E039 Hypothyroidism, unspecified: Secondary | ICD-10-CM

## 2021-12-18 DIAGNOSIS — J0140 Acute pansinusitis, unspecified: Secondary | ICD-10-CM | POA: Insufficient documentation

## 2021-12-18 DIAGNOSIS — F332 Major depressive disorder, recurrent severe without psychotic features: Secondary | ICD-10-CM

## 2021-12-18 DIAGNOSIS — R5382 Chronic fatigue, unspecified: Secondary | ICD-10-CM

## 2021-12-18 LAB — CBC WITH DIFFERENTIAL/PLATELET
Basophils Absolute: 0.1 10*3/uL (ref 0.0–0.1)
Basophils Relative: 0.8 % (ref 0.0–3.0)
Eosinophils Absolute: 0.3 10*3/uL (ref 0.0–0.7)
Eosinophils Relative: 2 % (ref 0.0–5.0)
HCT: 40.4 % (ref 36.0–46.0)
Hemoglobin: 13.2 g/dL (ref 12.0–15.0)
Lymphocytes Relative: 35.2 % (ref 12.0–46.0)
Lymphs Abs: 4.4 10*3/uL — ABNORMAL HIGH (ref 0.7–4.0)
MCHC: 32.6 g/dL (ref 30.0–36.0)
MCV: 92.7 fl (ref 78.0–100.0)
Monocytes Absolute: 0.9 10*3/uL (ref 0.1–1.0)
Monocytes Relative: 7.3 % (ref 3.0–12.0)
Neutro Abs: 6.8 10*3/uL (ref 1.4–7.7)
Neutrophils Relative %: 54.7 % (ref 43.0–77.0)
Platelets: 258 10*3/uL (ref 150.0–400.0)
RBC: 4.36 Mil/uL (ref 3.87–5.11)
RDW: 14.1 % (ref 11.5–15.5)
WBC: 12.5 10*3/uL — ABNORMAL HIGH (ref 4.0–10.5)

## 2021-12-18 LAB — HEPATIC FUNCTION PANEL
ALT: 25 U/L (ref 0–35)
AST: 22 U/L (ref 0–37)
Albumin: 4.2 g/dL (ref 3.5–5.2)
Alkaline Phosphatase: 65 U/L (ref 39–117)
Bilirubin, Direct: 0 mg/dL (ref 0.0–0.3)
Total Bilirubin: 0.3 mg/dL (ref 0.2–1.2)
Total Protein: 7.1 g/dL (ref 6.0–8.3)

## 2021-12-18 LAB — BASIC METABOLIC PANEL
BUN: 14 mg/dL (ref 6–23)
CO2: 26 mEq/L (ref 19–32)
Calcium: 9.8 mg/dL (ref 8.4–10.5)
Chloride: 104 mEq/L (ref 96–112)
Creatinine, Ser: 0.72 mg/dL (ref 0.40–1.20)
GFR: 105.85 mL/min (ref >60.00)
Glucose, Bld: 99 mg/dL (ref 70–99)
Potassium: 3.9 mEq/L (ref 3.5–5.1)
Sodium: 138 mEq/L (ref 135–145)

## 2021-12-18 LAB — CORTISOL: Cortisol, Plasma: 6 ug/dL

## 2021-12-18 LAB — HCG, QUANTITATIVE, PREGNANCY: Quantitative HCG: 1.5 m[IU]/mL

## 2021-12-18 LAB — TSH: TSH: 2.72 u[IU]/mL (ref 0.35–5.50)

## 2021-12-18 MED ORDER — AMOXICILLIN-POT CLAVULANATE 875-125 MG PO TABS: 1.0000 | ORAL_TABLET | Freq: Two times a day (BID) | ORAL | 0 refills | Status: AC

## 2021-12-18 NOTE — Progress Notes (Unsigned)
Subjective:  Patient ID: Alexandra Escobar, female    DOB: November 05, 1980  Age: 41 y.o. MRN: [id number]  CC: Hypothyroidism, Hypertension, and Depression   HPI Tenae Poche presents for ***  Outpatient Medications Prior to Visit  Medication Sig Dispense Refill   labetalol (NORMODYNE) 200 MG tablet Take 1 tablet (200 mg total) by mouth 2 (two) times daily. 180 tablet 0   lamoTRIgine (LAMICTAL) 25 MG tablet Take 50 mg by mouth 2 (two) times daily.     levothyroxine (SYNTHROID) 25 MCG tablet Take 1 tablet (25 mcg total) by mouth daily. 90 tablet 0   AUVELITY 45-105 MG TBCR Take 1 tablet by mouth daily.     No facility-administered medications prior to visit.    ROS Review of Systems  Objective:  BP 128/86 (BP Location: Left Arm, Patient Position: Sitting, Cuff Size: Large)   Pulse 79   Temp 98.5 F (36.9 C) (Oral)   Resp 16   Ht 5\' 2"  (1.575 m)   Wt 205 lb (93 kg)   SpO2 97%   BMI 37.49 kg/m   BP Readings from Last 3 Encounters:  12/18/21 128/86  10/21/21 128/86  09/19/21 130/80    Wt Readings from Last 3 Encounters:  12/18/21 205 lb (93 kg)  10/21/21 201 lb (91.2 kg)  09/19/21 197 lb 3.2 oz (89.4 kg)    Physical Exam  Lab Results  Component Value Date   WBC 12.5 (H) 12/18/2021   HGB 13.2 12/18/2021   HCT 40.4 12/18/2021   PLT 258.0 12/18/2021   GLUCOSE 99 12/18/2021   ALT 25 12/18/2021   AST 22 12/18/2021   NA 138 12/18/2021   K 3.9 12/18/2021   CL 104 12/18/2021   CREATININE 0.72 12/18/2021   BUN 14 12/18/2021   CO2 26 12/18/2021   TSH 2.72 12/18/2021    DG Chest 2 View  Result Date: 09/02/2021 CLINICAL DATA:  Cp, lightheaded that started this am. EXAM: CHEST - 2 VIEW COMPARISON:  none FINDINGS: Lungs are clear. Heart size and mediastinal contours are within normal limits. No pneumothorax.  No pleural effusion. Visualized bones unremarkable. IMPRESSION: No acute cardiopulmonary disease. Electronically Signed   By: 09/04/2021 M.D.   On: 09/02/2021 12:29   CT  Angio Chest PE W and/or Wo Contrast  Result Date: 09/02/2021 CLINICAL DATA:  Chest pain, positive D-dimer EXAM: CT ANGIOGRAPHY CHEST WITH CONTRAST TECHNIQUE: Multidetector CT imaging of the chest was performed using the standard protocol during bolus administration of intravenous contrast. Multiplanar CT image reconstructions and MIPs were obtained to evaluate the vascular anatomy. RADIATION DOSE REDUCTION: This exam was performed according to the departmental dose-optimization program which includes automated exposure control, adjustment of the mA and/or kV according to patient size and/or use of iterative reconstruction technique. CONTRAST:  37mL OMNIPAQUE IOHEXOL 350 MG/ML SOLN COMPARISON:  09/02/2021 chest x-ray FINDINGS: Cardiovascular: Pulmonary arteries appear patent and normal in caliber. Negative for significant acute filling defect or pulmonary embolus by CTA. Intact thoracic aorta. Patent 3 vessel arch anatomy. No aneurysm or dissection. No mediastinal hemorrhage or hematoma. Normal heart size. No pericardial effusion. Central venous structures are patent.  No veno-occlusive process. Mediastinum/Nodes: No enlarged mediastinal, hilar, or axillary lymph nodes. Thyroid gland, trachea, and esophagus demonstrate no significant findings. Lungs/Pleura: Lungs are clear. No pleural effusion or pneumothorax. Upper Abdomen: No acute abnormality. Musculoskeletal: No chest wall abnormality. No acute or significant osseous findings. Review of the MIP images confirms the above findings. IMPRESSION: Negative for significant  acute pulmonary embolus by CTA. No other acute intrathoracic finding. Electronically Signed   By: Judie Petit.  Shick M.D.   On: 09/02/2021 14:52    Assessment & Plan:   Sonjia was seen today for hypothyroidism, hypertension and depression.  Diagnoses and all orders for this visit:  Acquired hypothyroidism -     TSH; Future -     hCG, quantitative, pregnancy; Future -     Basic metabolic panel;  Future -     CBC with Differential/Platelet; Future -     Hepatic function panel; Future -     Hepatic function panel -     CBC with Differential/Platelet -     Basic metabolic panel -     hCG, quantitative, pregnancy -     TSH  Chronic fatigue -     hCG, quantitative, pregnancy; Future -     Cortisol; Future -     Basic metabolic panel; Future -     CBC with Differential/Platelet; Future -     Hepatic function panel; Future -     Hepatic function panel -     CBC with Differential/Platelet -     Basic metabolic panel -     Cortisol -     hCG, quantitative, pregnancy  Severe episode of recurrent major depressive disorder, without psychotic features (HCC) -     hCG, quantitative, pregnancy; Future -     Cortisol; Future -     Basic metabolic panel; Future -     CBC with Differential/Platelet; Future -     Hepatic function panel; Future -     Hepatic function panel -     CBC with Differential/Platelet -     Basic metabolic panel -     Cortisol -     hCG, quantitative, pregnancy  Acute non-recurrent pansinusitis -     amoxicillin-clavulanate (AUGMENTIN) 875-125 MG tablet; Take 1 tablet by mouth 2 (two) times daily for 10 days.   I have discontinued Zenda Shams's Auvelity. I am also having her start on amoxicillin-clavulanate. Additionally, I am having her maintain her lamoTRIgine, levothyroxine, and labetalol.  Meds ordered this encounter  Medications   amoxicillin-clavulanate (AUGMENTIN) 875-125 MG tablet    Sig: Take 1 tablet by mouth 2 (two) times daily for 10 days.    Dispense:  20 tablet    Refill:  0     Follow-up: Return in about 6 months (around 06/19/2022).  Sanda Linger, MD

## 2021-12-20 ENCOUNTER — Other Ambulatory Visit: Payer: Self-pay | Admitting: Internal Medicine

## 2021-12-26 DIAGNOSIS — F332 Major depressive disorder, recurrent severe without psychotic features: Secondary | ICD-10-CM | POA: Diagnosis not present

## 2021-12-26 DIAGNOSIS — F4312 Post-traumatic stress disorder, chronic: Secondary | ICD-10-CM | POA: Diagnosis not present

## 2021-12-26 DIAGNOSIS — F410 Panic disorder [episodic paroxysmal anxiety] without agoraphobia: Secondary | ICD-10-CM | POA: Diagnosis not present

## 2021-12-30 DIAGNOSIS — F430 Acute stress reaction: Secondary | ICD-10-CM | POA: Diagnosis not present

## 2021-12-30 DIAGNOSIS — F4312 Post-traumatic stress disorder, chronic: Secondary | ICD-10-CM | POA: Diagnosis not present

## 2021-12-30 DIAGNOSIS — F3181 Bipolar II disorder: Secondary | ICD-10-CM | POA: Diagnosis not present

## 2021-12-31 DIAGNOSIS — Z1231 Encounter for screening mammogram for malignant neoplasm of breast: Secondary | ICD-10-CM | POA: Diagnosis not present

## 2022-01-03 DIAGNOSIS — F4312 Post-traumatic stress disorder, chronic: Secondary | ICD-10-CM | POA: Diagnosis not present

## 2022-01-03 DIAGNOSIS — F332 Major depressive disorder, recurrent severe without psychotic features: Secondary | ICD-10-CM | POA: Diagnosis not present

## 2022-01-06 ENCOUNTER — Ambulatory Visit: Payer: BC Managed Care – PPO | Admitting: Internal Medicine

## 2022-01-08 DIAGNOSIS — F332 Major depressive disorder, recurrent severe without psychotic features: Secondary | ICD-10-CM | POA: Diagnosis not present

## 2022-01-08 DIAGNOSIS — F410 Panic disorder [episodic paroxysmal anxiety] without agoraphobia: Secondary | ICD-10-CM | POA: Diagnosis not present

## 2022-01-08 DIAGNOSIS — F4312 Post-traumatic stress disorder, chronic: Secondary | ICD-10-CM | POA: Diagnosis not present

## 2022-01-14 DIAGNOSIS — Z319 Encounter for procreative management, unspecified: Secondary | ICD-10-CM | POA: Diagnosis not present

## 2022-01-14 DIAGNOSIS — Z31430 Encounter of female for testing for genetic disease carrier status for procreative management: Secondary | ICD-10-CM | POA: Diagnosis not present

## 2022-01-14 DIAGNOSIS — E288 Other ovarian dysfunction: Secondary | ICD-10-CM | POA: Diagnosis not present

## 2022-01-14 DIAGNOSIS — E559 Vitamin D deficiency, unspecified: Secondary | ICD-10-CM | POA: Diagnosis not present

## 2022-01-14 DIAGNOSIS — N979 Female infertility, unspecified: Secondary | ICD-10-CM | POA: Diagnosis not present

## 2022-01-14 DIAGNOSIS — E669 Obesity, unspecified: Secondary | ICD-10-CM | POA: Diagnosis not present

## 2022-01-18 ENCOUNTER — Other Ambulatory Visit: Payer: Self-pay | Admitting: Internal Medicine

## 2022-01-18 DIAGNOSIS — E039 Hypothyroidism, unspecified: Secondary | ICD-10-CM

## 2022-01-23 ENCOUNTER — Encounter: Payer: Self-pay | Admitting: Internal Medicine

## 2022-01-23 DIAGNOSIS — F410 Panic disorder [episodic paroxysmal anxiety] without agoraphobia: Secondary | ICD-10-CM | POA: Diagnosis not present

## 2022-01-23 DIAGNOSIS — F4312 Post-traumatic stress disorder, chronic: Secondary | ICD-10-CM | POA: Diagnosis not present

## 2022-01-23 DIAGNOSIS — F3181 Bipolar II disorder: Secondary | ICD-10-CM | POA: Diagnosis not present

## 2022-01-23 DIAGNOSIS — F430 Acute stress reaction: Secondary | ICD-10-CM | POA: Diagnosis not present

## 2022-01-27 ENCOUNTER — Ambulatory Visit: Payer: BC Managed Care – PPO | Admitting: Internal Medicine

## 2022-01-30 DIAGNOSIS — F332 Major depressive disorder, recurrent severe without psychotic features: Secondary | ICD-10-CM | POA: Diagnosis not present

## 2022-01-30 DIAGNOSIS — F410 Panic disorder [episodic paroxysmal anxiety] without agoraphobia: Secondary | ICD-10-CM | POA: Diagnosis not present

## 2022-01-30 DIAGNOSIS — F4312 Post-traumatic stress disorder, chronic: Secondary | ICD-10-CM | POA: Diagnosis not present

## 2022-02-04 ENCOUNTER — Telehealth: Payer: Self-pay | Admitting: *Deleted

## 2022-02-04 DIAGNOSIS — Z3141 Encounter for fertility testing: Secondary | ICD-10-CM | POA: Diagnosis not present

## 2022-02-04 NOTE — Telephone Encounter (Signed)
Patient would like to schedule appointment for possible ingrown-right great toe.

## 2022-02-10 ENCOUNTER — Ambulatory Visit: Payer: BC Managed Care – PPO | Admitting: Podiatry

## 2022-02-10 ENCOUNTER — Encounter: Payer: Self-pay | Admitting: Podiatry

## 2022-02-10 DIAGNOSIS — L600 Ingrowing nail: Secondary | ICD-10-CM | POA: Diagnosis not present

## 2022-02-10 MED ORDER — CEPHALEXIN 500 MG PO CAPS: 500.0000 mg | ORAL_CAPSULE | Freq: Three times a day (TID) | ORAL | 0 refills | Status: AC

## 2022-02-10 NOTE — Patient Instructions (Signed)
   Place 1/4 cup of epsom salts (or betadine, or white vinegar) in a quart of warm tap water.  Submerge your foot or feet with outer bandage intact for the initial soak; this will allow the bandage to become moist and wet for easy lift off.  Once you remove your bandage, continue to soak in the solution for 20 minutes.  This soak should be done twice a day.  Next, remove your foot or feet from solution, blot dry the affected area and cover.  You may use a band aid large enough to cover the area or use gauze and tape.  Apply other medications to the area as directed by the doctor such as polysporin neosporin.  IF YOUR SKIN BECOMES IRRITATED WHILE USING THESE INSTRUCTIONS, IT IS OKAY TO SWITCH TO  WHITE VINEGAR AND WATER. Or you may use antibacterial soap and water to keep the toe clean  Monitor for any signs/symptoms of infection. Call the office immediately if any occur or go directly to the emergency room. Call with any questions/concerns.

## 2022-02-11 NOTE — Progress Notes (Signed)
  Subjective:  Patient ID: Alexandra Escobar, female    DOB: 04-17-1981,  MRN: [id number]  Chief Complaint  Patient presents with   Nail Problem    "The right big toe is infected again and it's ingrown." N - toenail painful L - hallux rt D - 2 weeks O - gradually worse C - bleeding, throb A - covers in bed, closed shoes, walking T - I tried to dig it out, epsom salt soaks, peroxide when bleeds    41 y.o. female returns for follow-up with the above complaint. History confirmed with patient.  The right lateral side is still very bothersome.  She says the rough skin is irritating and she trims it away, its been painful for the last month or so.  She tried using the urea cream but did not notice much change with that  Objective:  Physical Exam: warm, good capillary refill, no trophic changes or ulcerative lesions, normal DP and PT pulses, and normal sensory exam. Right hallux lateral border unable to detect any visible recurrence or regrowth of nail there is an area of dried blood where the hyperkeratotic skin was trimmed away  Assessment:   1. Ingrowing right great toenail        Plan:  Patient was evaluated and treated and all questions answered.     Ingrown Nail, right So far still unable to detect any actual regrowth or recurrence of the nail plate that is visible clinically.  If there is regrowth it is deep under the nail fold and likely would require a surgical matricectomy to remove it.  I think some of this is irritation and possible superficial infection from treatment with a hyperkeratotic skin.  I recommended Epsom salt soaks and Keflex for 2 weeks and I will reevaluate her at the end of that time..  If not improving then we will proceed with scheduling a surgical matricectomy to reevaluate.  I cautioned her that I think further removal and surgery on the nail could end up with further dystrophy of the nailbed and I am not sure that this will actually improve her symptoms  currently

## 2022-02-13 ENCOUNTER — Other Ambulatory Visit: Payer: Self-pay | Admitting: Internal Medicine

## 2022-02-13 DIAGNOSIS — I10 Essential (primary) hypertension: Secondary | ICD-10-CM

## 2022-02-17 ENCOUNTER — Ambulatory Visit: Payer: BC Managed Care – PPO | Admitting: Podiatry

## 2022-02-25 ENCOUNTER — Ambulatory Visit: Payer: BC Managed Care – PPO | Admitting: Podiatry

## 2022-02-25 DIAGNOSIS — Z319 Encounter for procreative management, unspecified: Secondary | ICD-10-CM | POA: Diagnosis not present

## 2022-02-25 DIAGNOSIS — N979 Female infertility, unspecified: Secondary | ICD-10-CM | POA: Diagnosis not present

## 2022-03-10 DIAGNOSIS — Z3169 Encounter for other general counseling and advice on procreation: Secondary | ICD-10-CM | POA: Diagnosis not present

## 2022-03-12 DIAGNOSIS — F410 Panic disorder [episodic paroxysmal anxiety] without agoraphobia: Secondary | ICD-10-CM | POA: Diagnosis not present

## 2022-03-12 DIAGNOSIS — F3181 Bipolar II disorder: Secondary | ICD-10-CM | POA: Diagnosis not present

## 2022-03-12 DIAGNOSIS — F4312 Post-traumatic stress disorder, chronic: Secondary | ICD-10-CM | POA: Diagnosis not present

## 2022-03-12 DIAGNOSIS — F430 Acute stress reaction: Secondary | ICD-10-CM | POA: Diagnosis not present

## 2022-03-17 DIAGNOSIS — F332 Major depressive disorder, recurrent severe without psychotic features: Secondary | ICD-10-CM | POA: Diagnosis not present

## 2022-03-17 DIAGNOSIS — F410 Panic disorder [episodic paroxysmal anxiety] without agoraphobia: Secondary | ICD-10-CM | POA: Diagnosis not present

## 2022-03-17 DIAGNOSIS — F4312 Post-traumatic stress disorder, chronic: Secondary | ICD-10-CM | POA: Diagnosis not present

## 2022-03-26 DIAGNOSIS — F39 Unspecified mood [affective] disorder: Secondary | ICD-10-CM | POA: Diagnosis not present

## 2022-03-26 DIAGNOSIS — I10 Essential (primary) hypertension: Secondary | ICD-10-CM | POA: Diagnosis not present

## 2022-03-26 DIAGNOSIS — E282 Polycystic ovarian syndrome: Secondary | ICD-10-CM | POA: Diagnosis not present

## 2022-03-26 DIAGNOSIS — Z3169 Encounter for other general counseling and advice on procreation: Secondary | ICD-10-CM | POA: Diagnosis not present

## 2022-03-28 DIAGNOSIS — F410 Panic disorder [episodic paroxysmal anxiety] without agoraphobia: Secondary | ICD-10-CM | POA: Diagnosis not present

## 2022-03-28 DIAGNOSIS — F332 Major depressive disorder, recurrent severe without psychotic features: Secondary | ICD-10-CM | POA: Diagnosis not present

## 2022-03-28 DIAGNOSIS — F4312 Post-traumatic stress disorder, chronic: Secondary | ICD-10-CM | POA: Diagnosis not present

## 2022-03-31 DIAGNOSIS — E282 Polycystic ovarian syndrome: Secondary | ICD-10-CM | POA: Diagnosis not present

## 2022-03-31 DIAGNOSIS — I10 Essential (primary) hypertension: Secondary | ICD-10-CM | POA: Diagnosis not present

## 2022-03-31 DIAGNOSIS — Z3169 Encounter for other general counseling and advice on procreation: Secondary | ICD-10-CM | POA: Diagnosis not present

## 2022-03-31 DIAGNOSIS — F39 Unspecified mood [affective] disorder: Secondary | ICD-10-CM | POA: Diagnosis not present

## 2022-03-31 DIAGNOSIS — E559 Vitamin D deficiency, unspecified: Secondary | ICD-10-CM | POA: Diagnosis not present

## 2022-04-02 DIAGNOSIS — Z630 Problems in relationship with spouse or partner: Secondary | ICD-10-CM | POA: Diagnosis not present

## 2022-04-02 DIAGNOSIS — F331 Major depressive disorder, recurrent, moderate: Secondary | ICD-10-CM | POA: Diagnosis not present

## 2022-04-07 ENCOUNTER — Encounter: Payer: Self-pay | Admitting: Internal Medicine

## 2022-04-07 ENCOUNTER — Other Ambulatory Visit: Payer: Self-pay | Admitting: Internal Medicine

## 2022-04-07 DIAGNOSIS — Z3169 Encounter for other general counseling and advice on procreation: Secondary | ICD-10-CM | POA: Diagnosis not present

## 2022-04-08 ENCOUNTER — Other Ambulatory Visit: Payer: Self-pay | Admitting: Internal Medicine

## 2022-04-08 DIAGNOSIS — E039 Hypothyroidism, unspecified: Secondary | ICD-10-CM

## 2022-04-08 MED ORDER — LEVOTHYROXINE SODIUM 50 MCG PO TABS: 50.0000 ug | ORAL_TABLET | Freq: Every day | ORAL | 0 refills | Status: DC

## 2022-04-10 DIAGNOSIS — F39 Unspecified mood [affective] disorder: Secondary | ICD-10-CM | POA: Diagnosis not present

## 2022-04-10 DIAGNOSIS — F331 Major depressive disorder, recurrent, moderate: Secondary | ICD-10-CM | POA: Diagnosis not present

## 2022-04-10 DIAGNOSIS — Z630 Problems in relationship with spouse or partner: Secondary | ICD-10-CM | POA: Diagnosis not present

## 2022-04-10 DIAGNOSIS — I10 Essential (primary) hypertension: Secondary | ICD-10-CM | POA: Diagnosis not present

## 2022-04-10 DIAGNOSIS — E282 Polycystic ovarian syndrome: Secondary | ICD-10-CM | POA: Diagnosis not present

## 2022-04-10 DIAGNOSIS — Z3169 Encounter for other general counseling and advice on procreation: Secondary | ICD-10-CM | POA: Diagnosis not present

## 2022-04-14 DIAGNOSIS — Z630 Problems in relationship with spouse or partner: Secondary | ICD-10-CM | POA: Diagnosis not present

## 2022-04-14 DIAGNOSIS — F331 Major depressive disorder, recurrent, moderate: Secondary | ICD-10-CM | POA: Diagnosis not present

## 2022-04-18 DIAGNOSIS — F4312 Post-traumatic stress disorder, chronic: Secondary | ICD-10-CM | POA: Diagnosis not present

## 2022-04-18 DIAGNOSIS — F332 Major depressive disorder, recurrent severe without psychotic features: Secondary | ICD-10-CM | POA: Diagnosis not present

## 2022-04-19 ENCOUNTER — Encounter (HOSPITAL_COMMUNITY): Payer: Self-pay

## 2022-04-19 ENCOUNTER — Ambulatory Visit (HOSPITAL_COMMUNITY)
Admission: EM | Admit: 2022-04-19 | Discharge: 2022-04-19 | Disposition: A | Discharge status: Home / Self Care | Payer: BC Managed Care – PPO | Attending: Emergency Medicine | Admitting: Emergency Medicine

## 2022-04-19 DIAGNOSIS — N764 Abscess of vulva: Secondary | ICD-10-CM | POA: Diagnosis not present

## 2022-04-19 MED ORDER — DOXYCYCLINE HYCLATE 100 MG PO CAPS: 100.0000 mg | ORAL_CAPSULE | Freq: Two times a day (BID) | ORAL | 0 refills | Status: DC

## 2022-04-19 NOTE — ED Provider Notes (Signed)
[place]    CSN: [id number] Arrival date & time: 04/19/22  1124      History   Chief Complaint Chief Complaint  Patient presents with   Abscess    HPI Alexandra Escobar is a 41 y.o. female.   Patient presents with cyst to the left external labia noticed 3 days ago.  Has had an increase with pain inside is tender to palpation.  Endorses shaving.  Denies drainage, fever and chills. Past Medical History:  Diagnosis Date   Fatty liver    Hypertension     Patient Active Problem List   Diagnosis Date Noted   Severe episode of recurrent major depressive disorder, without psychotic features ([place]) 12/18/2021   Acute non-recurrent pansinusitis 12/18/2021   Acquired hypothyroidism 10/22/2021   Encounter for general adult medical examination with abnormal findings 10/21/2021   Chronic fatigue 10/21/2021    Past Surgical History:  Procedure Laterality Date   DILATION AND CURETTAGE OF UTERUS  06/2021    OB History     Gravida  1   Para      Term      Preterm      AB      Living         SAB      IAB      Ectopic      Multiple      Live Births               Home Medications    Prior to Admission medications   Medication Sig Start Date End Date Taking? Authorizing Provider  levothyroxine (SYNTHROID) 50 MCG tablet Take 1 tablet (50 mcg total) by mouth daily. 04/08/22   Janith Lima, MD  labetalol (NORMODYNE) 200 MG tablet TAKE 1 TABLET(200 MG) BY MOUTH TWICE DAILY 02/13/22   Janith Lima, MD  lamoTRIgine (LAMICTAL) 25 MG tablet Take 50 mg by mouth 2 (two) times daily. 08/15/21   [provider]    Family History Family History  Problem Relation Age of Onset   Hypertension Father    Depression Father    Hypertension Sister    Hypertension Paternal Grandmother     Social History Social History   Tobacco Use   Smoking status: Never    Passive exposure: Never   Smokeless tobacco: Never  Substance Use Topics   Alcohol use:  Never   Drug use: Never     Allergies   Patient has no known allergies.   Review of Systems Review of Systems Defer to HPI   Physical Exam Triage Vital Signs ED Triage Vitals  Enc Vitals Group     BP 04/19/22 1310 113/80     Pulse Rate 04/19/22 1310 77     Resp 04/19/22 1310 16     Temp 04/19/22 1310 98.4 F (36.9 C)     Temp Source 04/19/22 1310 Oral     SpO2 04/19/22 1310 100 %     Weight 04/19/22 1309 194 lb (88 kg)     Height 04/19/22 1309 5\' 2"  (1.575 m)     Head Circumference --      Peak Flow --      Pain Score 04/19/22 1308 6     Pain Loc --      Pain Edu? --      Excl. in [place]? --    No data found.  Updated Vital Signs BP 113/80 (BP Location: Left Arm)   Pulse 77  Temp 98.4 F (36.9 C) (Oral)   Resp 16   Ht 5\' 2"  (1.575 m)   Wt 194 lb (88 kg)   LMP 03/28/2022   SpO2 100%   BMI 35.48 kg/m   Visual Acuity Right Eye Distance:   Left Eye Distance:   Bilateral Distance:    Right Eye Near:   Left Eye Near:    Bilateral Near:     Physical Exam Constitutional:      Appearance: Normal appearance.  Eyes:     Extraocular Movements: Extraocular movements intact.  Pulmonary:     Effort: Pulmonary effort is normal.  Genitourinary:    Comments: 0.5 cm immature cyst present deep within the labial tissue Neurological:     Mental Status: She is alert and oriented to person, place, and time. Mental status is at baseline.  Psychiatric:        Mood and Affect: Mood normal.        Behavior: Behavior normal.      UC Treatments / Results  Labs (all labs ordered are listed, but only abnormal results are displayed) Labs Reviewed - No data to display  EKG   Radiology No results found.  Procedures Procedures (including critical care time)  Medications Ordered in UC Medications - No data to display  Initial Impression / Assessment and Plan / UC Course  I have reviewed the triage vital signs and the nursing notes.  Pertinent labs & imaging  results that were available during my care of the patient were reviewed by me and considered in my medical decision making (see chart for details).  Abscess of left genital labia  Cyst present however it is immature at this time, discussed with patient that we will be unable to complete incision is any drainage due to how deep it is present in the skin, etiology is most likely an ingrown hair, discussed with patient however at this time she does not feel that an ingrown hair is the source, discussed that there is a Bartholin's gland may be clogged however again that the cyst is deep within the vaginal area it is unable to be drained today, placed on doxycycline and advised warm compresses or soaks to the affected area, may use over counter analgesics for discomfort, recommended follow-up with gynecologist if symptoms persist Final Clinical Impressions(s) / UC Diagnoses   Final diagnoses:  None   Discharge Instructions   None    ED Prescriptions   None    PDMP not reviewed this encounter.   05/28/2022, NP 04/19/22 1419

## 2022-04-19 NOTE — ED Triage Notes (Signed)
Pt is here for a possible abscess on the vagina x3days causing pain and discomfort.

## 2022-04-19 NOTE — Discharge Instructions (Signed)
Today you are being treated for cyst whether that be due to ingrown hair or your gland it is too deep underneath the skin to be drained today typically when completing drainage in office the cyst is superficial, can be easily seen by the naked eye and is very bouncy and soft like a pimple on the face  Begin doxycycline every morning and every evening for 7 days to cover for infection  Hold warm compresses over the affected area or complete warm soaks, this will help to soften the tissue and will give you comfort from your pain  May take Tylenol or ibuprofen as needed  If you do not begin to see any relief of symptoms by Monday please call and schedule appointment with your gynecologist for further evaluation and management

## 2022-04-21 ENCOUNTER — Emergency Department (HOSPITAL_BASED_OUTPATIENT_CLINIC_OR_DEPARTMENT_OTHER): Payer: BC Managed Care – PPO | Admitting: Radiology

## 2022-04-21 ENCOUNTER — Emergency Department (HOSPITAL_BASED_OUTPATIENT_CLINIC_OR_DEPARTMENT_OTHER): Payer: BC Managed Care – PPO

## 2022-04-21 ENCOUNTER — Encounter (HOSPITAL_BASED_OUTPATIENT_CLINIC_OR_DEPARTMENT_OTHER): Payer: Self-pay

## 2022-04-21 ENCOUNTER — Telehealth: Payer: Self-pay

## 2022-04-21 ENCOUNTER — Emergency Department (HOSPITAL_BASED_OUTPATIENT_CLINIC_OR_DEPARTMENT_OTHER)
Admission: EM | Admit: 2022-04-21 | Discharge: 2022-04-21 | Disposition: A | Discharge status: Home / Self Care | Payer: BC Managed Care – PPO | Attending: Emergency Medicine | Admitting: Emergency Medicine

## 2022-04-21 DIAGNOSIS — I10 Essential (primary) hypertension: Secondary | ICD-10-CM | POA: Insufficient documentation

## 2022-04-21 DIAGNOSIS — F331 Major depressive disorder, recurrent, moderate: Secondary | ICD-10-CM | POA: Diagnosis not present

## 2022-04-21 DIAGNOSIS — Z79899 Other long term (current) drug therapy: Secondary | ICD-10-CM | POA: Diagnosis not present

## 2022-04-21 DIAGNOSIS — R0602 Shortness of breath: Secondary | ICD-10-CM | POA: Diagnosis not present

## 2022-04-21 DIAGNOSIS — R0789 Other chest pain: Secondary | ICD-10-CM | POA: Diagnosis not present

## 2022-04-21 DIAGNOSIS — Z630 Problems in relationship with spouse or partner: Secondary | ICD-10-CM | POA: Diagnosis not present

## 2022-04-21 DIAGNOSIS — R079 Chest pain, unspecified: Secondary | ICD-10-CM | POA: Diagnosis not present

## 2022-04-21 LAB — BASIC METABOLIC PANEL
Anion gap: 10 (ref 5–15)
BUN: 15 mg/dL (ref 6–20)
CO2: 23 mmol/L (ref 22–32)
Calcium: 9.6 mg/dL (ref 8.9–10.3)
Chloride: 104 mmol/L (ref 98–111)
Creatinine, Ser: 0.79 mg/dL (ref 0.44–1.00)
GFR, Estimated: >60 mL/min (ref >60)
Glucose, Bld: 111 mg/dL — ABNORMAL HIGH (ref 70–99)
Potassium: 3.4 mmol/L — ABNORMAL LOW (ref 3.5–5.1)
Sodium: 137 mmol/L (ref 135–145)

## 2022-04-21 LAB — CBC
HCT: 37.7 % (ref 36.0–46.0)
Hemoglobin: 13.2 g/dL (ref 12.0–15.0)
MCH: 31.1 pg (ref 26.0–34.0)
MCHC: 35 g/dL (ref 30.0–36.0)
MCV: 88.9 fL (ref 80.0–100.0)
Platelets: 274 10*3/uL (ref 150–400)
RBC: 4.24 MIL/uL (ref 3.87–5.11)
RDW: 14.6 % (ref 11.5–15.5)
WBC: 14.3 10*3/uL — ABNORMAL HIGH (ref 4.0–10.5)
nRBC: 0 % (ref 0.0–0.2)

## 2022-04-21 LAB — TROPONIN I (HIGH SENSITIVITY)
Troponin I (High Sensitivity): <2 ng/L (ref <18)
Troponin I (High Sensitivity): <2 ng/L (ref <18)

## 2022-04-21 LAB — D-DIMER, QUANTITATIVE: D-Dimer, Quant: 0.59 ug/mL-FEU — ABNORMAL HIGH (ref 0.00–0.50)

## 2022-04-21 LAB — PREGNANCY, URINE: Preg Test, Ur: NEGATIVE

## 2022-04-21 MED ORDER — NAPROXEN 500 MG PO TABS: 500.0000 mg | ORAL_TABLET | Freq: Two times a day (BID) | ORAL | 0 refills | Status: DC

## 2022-04-21 MED ORDER — IOHEXOL 350 MG/ML SOLN
100.0000 mL | Freq: Once | INTRAVENOUS | Status: AC | PRN
  Administered 2022-04-21: 75 mL via INTRAVENOUS

## 2022-04-21 MED ORDER — KETOROLAC TROMETHAMINE 30 MG/ML IJ SOLN
30.0000 mg | Freq: Once | INTRAMUSCULAR | Status: AC
  Administered 2022-04-21: 30 mg via INTRAVENOUS
  Filled 2022-04-21: qty 1

## 2022-04-21 NOTE — ED Provider Notes (Signed)
[facility] DEPT Provider Note   CSN: [id number] Arrival date & time: 04/21/22  0049     History  Chief Complaint  Patient presents with   Chest Pain    Alexandra Escobar is a 41 y.o. female.  HPI     This is a 41 year old female who presents with chest discomfort.  Patient reports she has had increasing chest discomfort since approximately 9 PM.  It radiates to the back.  It is worse with taking deep breaths.  She has had similar pain like this in the past.  No history of blood clots.  No recent fever or cough.  Denies exertional nature of the pain.  She has not taken anything for the pain.  Home Medications Prior to Admission medications   Medication Sig Start Date End Date Taking? Authorizing Provider  levothyroxine (SYNTHROID) 50 MCG tablet Take 1 tablet (50 mcg total) by mouth daily. 04/08/22   Janith Lima, MD  naproxen (NAPROSYN) 500 MG tablet Take 1 tablet (500 mg total) by mouth 2 (two) times daily. 04/21/22  Yes Geo Slone, Barbette Hair, MD  doxycycline (VIBRAMYCIN) 100 MG capsule Take 1 capsule (100 mg total) by mouth 2 (two) times daily. 04/19/22   White, Leitha Schuller, NP  labetalol (NORMODYNE) 200 MG tablet TAKE 1 TABLET(200 MG) BY MOUTH TWICE DAILY 02/13/22   Janith Lima, MD  lamoTRIgine (LAMICTAL) 25 MG tablet Take 50 mg by mouth 2 (two) times daily. 08/15/21   [provider]      Allergies    Patient has no known allergies.    Review of Systems   Review of Systems  Constitutional:  Negative for fever.  Respiratory:  Positive for shortness of breath. Negative for cough.   Cardiovascular:  Positive for chest pain. Negative for leg swelling.  All other systems reviewed and are negative.   Physical Exam Updated Vital Signs BP 121/78   Pulse 74   Temp (!) 97.5 F (36.4 C) (Oral)   Resp (!) 22   Ht 1.575 m (5\' 2" )   Wt 88 kg   LMP 03/28/2022   SpO2 98%   BMI 35.48 kg/m  Physical Exam Vitals and nursing note reviewed.   Constitutional:      Appearance: She is well-developed. She is not ill-appearing.  HENT:     Head: Normocephalic and atraumatic.  Eyes:     Pupils: Pupils are equal, round, and reactive to light.  Cardiovascular:     Rate and Rhythm: Normal rate and regular rhythm.     Heart sounds: Normal heart sounds.  Pulmonary:     Effort: Pulmonary effort is normal. No respiratory distress.     Breath sounds: No wheezing.  Chest:     Chest wall: Tenderness present.  Abdominal:     Palpations: Abdomen is soft.  Musculoskeletal:     Cervical back: Neck supple.     Right lower leg: No tenderness. No edema.     Left lower leg: No tenderness. No edema.  Skin:    General: Skin is warm and dry.  Neurological:     Mental Status: She is alert and oriented to person, place, and time.  Psychiatric:        Mood and Affect: Mood normal.     ED Results / Procedures / Treatments   Labs (all labs ordered are listed, but only abnormal results are displayed) Labs Reviewed  BASIC METABOLIC PANEL - Abnormal; Notable for the following components:  Result Value   Potassium 3.4 (*)    Glucose, Bld 111 (*)    All other components within normal limits  CBC - Abnormal; Notable for the following components:   WBC 14.3 (*)    All other components within normal limits  D-DIMER, QUANTITATIVE - Abnormal; Notable for the following components:   D-Dimer, Quant 0.59 (*)    All other components within normal limits  PREGNANCY, URINE  TROPONIN I (HIGH SENSITIVITY)  TROPONIN I (HIGH SENSITIVITY)    EKG EKG Interpretation  Date/Time:  Monday April 21 2022 01:00:27 EDT Ventricular Rate:  72 PR Interval:  134 QRS Duration: 70 QT Interval:  376 QTC Calculation: 411 R Axis:   26 Text Interpretation: Normal sinus rhythm Confirmed by Ross Marcus ([id number]) on 04/21/2022 2:28:47 AM  Radiology CT Angio Chest PE W and/or Wo Contrast  Result Date: 04/21/2022 CLINICAL DATA:  Pulmonary embolism suspected,  positive D-dimer. Left-sided chest pain with shortness of breath. EXAM: CT ANGIOGRAPHY CHEST WITH CONTRAST TECHNIQUE: Multidetector CT imaging of the chest was performed using the standard protocol during bolus administration of intravenous contrast. Multiplanar CT image reconstructions and MIPs were obtained to evaluate the vascular anatomy. RADIATION DOSE REDUCTION: This exam was performed according to the departmental dose-optimization program which includes automated exposure control, adjustment of the mA and/or kV according to patient size and/or use of iterative reconstruction technique. CONTRAST:  24mL OMNIPAQUE IOHEXOL 350 MG/ML SOLN COMPARISON:  09/02/2021. FINDINGS: Cardiovascular: The heart is normal in size and there is no pericardial effusion. The aorta and pulmonary trunk are normal in caliber. Mediastinum/Nodes: No mediastinal, axillary, or hilar lymphadenopathy. The thyroid gland, trachea, and esophagus are within normal limits. Lungs/Pleura: Lungs are clear. No pleural effusion or pneumothorax. Upper Abdomen: No acute abnormality. Musculoskeletal: No chest wall abnormality. No acute or significant osseous findings. Review of the MIP images confirms the above findings. IMPRESSION: No evidence of pulmonary embolism or other acute process. Electronically Signed   By: Thornell Sartorius M.D.   On: 04/21/2022 04:31   DG Chest 2 View  Result Date: 04/21/2022 CLINICAL DATA:  Chest pain EXAM: CHEST - 2 VIEW COMPARISON:  10/21/2021 FINDINGS: Lungs are clear.  No pleural effusion or pneumothorax. The heart is normal in size. Visualized osseous structures are within normal limits. IMPRESSION: Normal chest radiographs. Electronically Signed   By: Charline Bills M.D.   On: 04/21/2022 01:15    Procedures Procedures    Medications Ordered in ED Medications  ketorolac (TORADOL) 30 MG/ML injection 30 mg (30 mg Intravenous Given 04/21/22 0310)  iohexol (OMNIPAQUE) 350 MG/ML injection 100 mL (75 mLs  Intravenous Contrast Given 04/21/22 0411)    ED Course/ Medical Decision Making/ A&P                           Medical Decision Making Amount and/or Complexity of Data Reviewed Labs: ordered. Radiology: ordered.  Risk Prescription drug management.   This patient presents to the ED for concern of chest pain, this involves an extensive number of treatment options, and is a complaint that carries with it a high risk of complications and morbidity.  I considered the following differential and admission for this acute, potentially life threatening condition.  The differential diagnosis includes ACS, PE, arrhythmia, chest wall pain, pneumonia, pneumothorax  MDM:    This is a 41 year old female who presents with chest pain.  She is nontoxic and vital signs are notable for blood pressure of 147/100.  She has reproducible chest pain on exam.  EKG shows no evidence of acute ischemia or arrhythmia.  Chest x-ray shows no evidence of pneumothorax or pneumonia.  Troponin x2 is negative.  Doubt primary ACS and she is low risk.  Basic lab work is otherwise reassuring.  D-dimer was sent and is slightly elevated at 0.59.  She had a similarly elevated D-dimer in February with a negative CT scan.  However, it is above age-adjusted cut off.  We will repeat CT given pleuritic nature of pain.  CT scan is negative for acute PE.  Patient was given Toradol with significant improvement of pain.  Suspect chest wall pain.  Recommend naproxen twice daily.  (Labs, imaging, consults)  Labs: I Ordered, and personally interpreted labs.  The pertinent results include: CBC, BMP, troponin x2, D-dimer  Imaging Studies ordered: I ordered imaging studies including chest x-ray CT chest I independently visualized and interpreted imaging. I agree with the radiologist interpretation  Additional history obtained from chart review.  External records from outside source obtained and reviewed including prior evaluations  Cardiac  Monitoring: The patient was maintained on a cardiac monitor.  I personally viewed and interpreted the cardiac monitored which showed an underlying rhythm of: Sinus rhythm  Reevaluation: After the interventions noted above, I reevaluated the patient and found that they have :improved  Social Determinants of Health: Lives independently  Disposition: Discharge  Co morbidities that complicate the patient evaluation  Past Medical History:  Diagnosis Date   Fatty liver    Hypertension      Medicines Meds ordered this encounter  Medications   ketorolac (TORADOL) 30 MG/ML injection 30 mg   iohexol (OMNIPAQUE) 350 MG/ML injection 100 mL   naproxen (NAPROSYN) 500 MG tablet    Sig: Take 1 tablet (500 mg total) by mouth 2 (two) times daily.    Dispense:  30 tablet    Refill:  0    I have reviewed the patients home medicines and have made adjustments as needed  Problem List / ED Course: Problem List Items Addressed This Visit   None Visit Diagnoses     Atypical chest pain    -  Primary                   Final Clinical Impression(s) / ED Diagnoses Final diagnoses:  Atypical chest pain    Rx / DC Orders ED Discharge Orders          Ordered    naproxen (NAPROSYN) 500 MG tablet  2 times daily        04/21/22 0457              Meleena Munroe, Mayer Masker, MD 04/21/22 0502

## 2022-04-21 NOTE — ED Triage Notes (Signed)
Pt states that she has been having L sided CP with SOB for the past 3 hours, radiation to back and some dizziness, pain is worse with deep breath.

## 2022-04-21 NOTE — Telephone Encounter (Signed)
Transition Care Management Unsuccessful Follow-up Telephone Call  Date of discharge and from where:   04/21/22-[facility]  Attempts:  1st Attempt  Reason for unsuccessful TCM follow-up call:  Left voice message

## 2022-04-21 NOTE — Discharge Instructions (Signed)
You were seen today for chest discomfort.  Your CT scan and work-up is reassuring.  Trial naproxen twice daily for your pain.

## 2022-04-22 NOTE — Telephone Encounter (Signed)
Transition Care Management Follow-up Telephone Call Date of discharge and from where: 04/21/22-[facility] How have you been since you were released from the hospital? Pt stated that she is not well.  Any questions or concerns? No  Items Reviewed: Did the pt receive and understand the discharge instructions provided? Yes  Medications obtained and verified?  N/a Other?  N/a Any new allergies since your discharge? No  Dietary orders reviewed? No Do you have support at home? Yes   Home Care and Equipment/Supplies: Were home health services ordered? not applicable If so, what is the name of the agency? N/a  Has the agency set up a time to come to the patient's home? not applicable Were any new equipment or medical supplies ordered?  No What is the name of the medical supply agency? N/a Were you able to get the supplies/equipment? not applicable Do you have any questions related to the use of the equipment or supplies? No  Functional Questionnaire: (I = Independent and D = Dependent) ADLs: I  Bathing/Dressing- I  Meal Prep- I  Eating- I  Maintaining continence- I  Transferring/Ambulation- I  Managing Meds- I  Follow up appointments reviewed:  PCP Hospital f/u appt confirmed? Yes  Scheduled to see PCP on 10/4 @ 2.40pm. [facility] f/u appt confirmed? No  Scheduled to see n/a on n/a @ n/a. Are transportation arrangements needed? No  If their condition worsens, is the pt aware to call PCP or go to the Emergency Dept.? Yes Was the patient provided with contact information for the PCP's office or ED? Yes Was to pt encouraged to call back with questions or concerns? Yes

## 2022-04-23 ENCOUNTER — Encounter: Payer: Self-pay | Admitting: Internal Medicine

## 2022-04-23 ENCOUNTER — Ambulatory Visit: Payer: BC Managed Care – PPO | Admitting: Internal Medicine

## 2022-04-23 VITALS — BP 122/78 | HR 80 | Temp 98.3°F | Resp 16 | Wt 197.0 lb

## 2022-04-23 DIAGNOSIS — E876 Hypokalemia: Secondary | ICD-10-CM | POA: Diagnosis not present

## 2022-04-23 DIAGNOSIS — E039 Hypothyroidism, unspecified: Secondary | ICD-10-CM

## 2022-04-23 DIAGNOSIS — I10 Essential (primary) hypertension: Secondary | ICD-10-CM

## 2022-04-23 DIAGNOSIS — R739 Hyperglycemia, unspecified: Secondary | ICD-10-CM

## 2022-04-23 DIAGNOSIS — R0609 Other forms of dyspnea: Secondary | ICD-10-CM

## 2022-04-23 LAB — CBC WITH DIFFERENTIAL/PLATELET
Basophils Absolute: 0.1 K/uL (ref 0.0–0.1)
Basophils Relative: 0.9 % (ref 0.0–3.0)
Eosinophils Absolute: 0.1 K/uL (ref 0.0–0.7)
Eosinophils Relative: 1 % (ref 0.0–5.0)
HCT: 39.5 % (ref 36.0–46.0)
Hemoglobin: 13.2 g/dL (ref 12.0–15.0)
Lymphocytes Relative: 31.3 % (ref 12.0–46.0)
Lymphs Abs: 3.9 K/uL (ref 0.7–4.0)
MCHC: 33.3 g/dL (ref 30.0–36.0)
MCV: 91.2 fl (ref 78.0–100.0)
Monocytes Absolute: 0.8 K/uL (ref 0.1–1.0)
Monocytes Relative: 6.6 % (ref 3.0–12.0)
Neutro Abs: 7.5 K/uL (ref 1.4–7.7)
Neutrophils Relative %: 60.2 % (ref 43.0–77.0)
Platelets: 252 K/uL (ref 150.0–400.0)
RBC: 4.33 Mil/uL (ref 3.87–5.11)
RDW: 14.5 % (ref 11.5–15.5)
WBC: 12.5 K/uL — ABNORMAL HIGH (ref 4.0–10.5)

## 2022-04-23 LAB — BASIC METABOLIC PANEL
BUN: 8 mg/dL (ref 6–23)
CO2: 24 mEq/L (ref 19–32)
Calcium: 9.2 mg/dL (ref 8.4–10.5)
Chloride: 106 mEq/L (ref 96–112)
Creatinine, Ser: 0.73 mg/dL (ref 0.40–1.20)
GFR: 103.86 mL/min (ref >60.00)
Glucose, Bld: 95 mg/dL (ref 70–99)
Potassium: 4.1 mEq/L (ref 3.5–5.1)
Sodium: 136 mEq/L (ref 135–145)

## 2022-04-23 LAB — TSH: TSH: 2.51 u[IU]/mL (ref 0.35–5.50)

## 2022-04-23 LAB — HEMOGLOBIN A1C: Hgb A1c MFr Bld: 5.9 % (ref 4.6–6.5)

## 2022-04-23 LAB — MAGNESIUM: Magnesium: 1.8 mg/dL (ref 1.5–2.5)

## 2022-04-23 NOTE — Progress Notes (Unsigned)
Subjective:  Patient ID: Alexandra Escobar, female    DOB: Sep 25, 1980  Age: 41 y.o. MRN: [id number]  CC: Hypertension and Hypothyroidism   HPI Alexandra Escobar presents for f/up -  She complains of a several week history of dyspnea on exertion and chest pain that she describes as spasms.  She was recently seen in the ED and a work-up was unremarkable.  She is undergoing fertility treatments and she was told to undergo an echocardiogram.  She deferred on the flu vaccine today.  Outpatient Medications Prior to Visit  Medication Sig Dispense Refill   doxycycline (VIBRAMYCIN) 100 MG capsule Take 1 capsule (100 mg total) by mouth 2 (two) times daily. 14 capsule 0   labetalol (NORMODYNE) 200 MG tablet TAKE 1 TABLET(200 MG) BY MOUTH TWICE DAILY 180 tablet 0   lamoTRIgine (LAMICTAL) 25 MG tablet Take 100 mg by mouth 2 (two) times daily.     levothyroxine (SYNTHROID) 50 MCG tablet Take 1 tablet (50 mcg total) by mouth daily. 90 tablet 0   naproxen (NAPROSYN) 500 MG tablet Take 1 tablet (500 mg total) by mouth 2 (two) times daily. 30 tablet 0   No facility-administered medications prior to visit.    ROS Review of Systems  Constitutional:  Negative for chills, diaphoresis, fatigue and fever.  HENT: Negative.    Eyes: Negative.   Respiratory:  Positive for shortness of breath. Negative for cough, chest tightness and wheezing.   Cardiovascular:  Negative for chest pain, palpitations and leg swelling.  Gastrointestinal:  Negative for abdominal pain, diarrhea, nausea and vomiting.  Endocrine: Negative.   Genitourinary: Negative.  Negative for difficulty urinating.  Musculoskeletal: Negative.   Skin: Negative.   Neurological:  Negative for dizziness, weakness and headaches.  Hematological:  Negative for adenopathy. Does not bruise/bleed easily.  Psychiatric/Behavioral:  Positive for dysphoric mood. Negative for sleep disturbance and suicidal ideas. The patient is nervous/anxious.     Objective:  BP  122/78 (BP Location: Right Arm, Patient Position: Sitting, Cuff Size: Normal)   Pulse 80   Temp 98.3 F (36.8 C) (Oral)   Resp 16   Wt 197 lb (89.4 kg)   LMP 03/28/2022 (Exact Date) Comment: neg preg test  SpO2 97%   BMI 36.03 kg/m   BP Readings from Last 3 Encounters:  04/23/22 122/78  04/21/22 (!) 127/94  04/19/22 113/80    Wt Readings from Last 3 Encounters:  04/23/22 197 lb (89.4 kg)  04/21/22 194 lb (88 kg)  04/19/22 194 lb (88 kg)    Physical Exam Vitals reviewed.  HENT:     Mouth/Throat:     Mouth: Mucous membranes are moist.  Eyes:     General: No scleral icterus.    Pupils: Pupils are equal, round, and reactive to light.  Cardiovascular:     Rate and Rhythm: Normal rate and regular rhythm.     Heart sounds: No murmur heard.    No friction rub. No gallop.  Pulmonary:     Effort: Pulmonary effort is normal.     Breath sounds: No stridor. No wheezing, rhonchi or rales.  Abdominal:     General: Abdomen is flat.     Palpations: There is no mass.     Tenderness: There is no abdominal tenderness. There is no guarding.     Hernia: No hernia is present.  Musculoskeletal:        General: Normal range of motion.     Cervical back: Neck supple.  Right lower leg: No edema.     Left lower leg: No edema.  Lymphadenopathy:     Cervical: No cervical adenopathy.  Skin:    General: Skin is warm and dry.  Neurological:     General: No focal deficit present.     Mental Status: She is alert.  Psychiatric:        Mood and Affect: Mood normal.        Behavior: Behavior normal.     Lab Results  Component Value Date   WBC 12.5 (H) 04/23/2022   HGB 13.2 04/23/2022   HCT 39.5 04/23/2022   PLT 252.0 04/23/2022   GLUCOSE 95 04/23/2022   ALT 25 12/18/2021   AST 22 12/18/2021   NA 136 04/23/2022   K 4.1 04/23/2022   CL 106 04/23/2022   CREATININE 0.73 04/23/2022   BUN 8 04/23/2022   CO2 24 04/23/2022   TSH 2.51 04/23/2022   HGBA1C 5.9 04/23/2022    CT Angio  Chest PE W and/or Wo Contrast  Result Date: 04/21/2022 CLINICAL DATA:  Pulmonary embolism suspected, positive D-dimer. Left-sided chest pain with shortness of breath. EXAM: CT ANGIOGRAPHY CHEST WITH CONTRAST TECHNIQUE: Multidetector CT imaging of the chest was performed using the standard protocol during bolus administration of intravenous contrast. Multiplanar CT image reconstructions and MIPs were obtained to evaluate the vascular anatomy. RADIATION DOSE REDUCTION: This exam was performed according to the departmental dose-optimization program which includes automated exposure control, adjustment of the mA and/or kV according to patient size and/or use of iterative reconstruction technique. CONTRAST:  27mL OMNIPAQUE IOHEXOL 350 MG/ML SOLN COMPARISON:  09/02/2021. FINDINGS: Cardiovascular: The heart is normal in size and there is no pericardial effusion. The aorta and pulmonary trunk are normal in caliber. Mediastinum/Nodes: No mediastinal, axillary, or hilar lymphadenopathy. The thyroid gland, trachea, and esophagus are within normal limits. Lungs/Pleura: Lungs are clear. No pleural effusion or pneumothorax. Upper Abdomen: No acute abnormality. Musculoskeletal: No chest wall abnormality. No acute or significant osseous findings. Review of the MIP images confirms the above findings. IMPRESSION: No evidence of pulmonary embolism or other acute process. Electronically Signed   By: Brett Fairy M.D.   On: 04/21/2022 04:31   DG Chest 2 View  Result Date: 04/21/2022 CLINICAL DATA:  Chest pain EXAM: CHEST - 2 VIEW COMPARISON:  10/21/2021 FINDINGS: Lungs are clear.  No pleural effusion or pneumothorax. The heart is normal in size. Visualized osseous structures are within normal limits. IMPRESSION: Normal chest radiographs. Electronically Signed   By: Julian Hy M.D.   On: 04/21/2022 01:15    Assessment & Plan:   [name] was seen today for hypertension and hypothyroidism.  Diagnoses and all orders for  this visit:  Acquired hypothyroidism- She is euthyroid. -     TSH; Future -     TSH  Primary hypertension- Her blood pressure is well controlled. -     Basic metabolic panel; Future -     CBC with Differential/Platelet; Future -     CBC with Differential/Platelet -     Basic metabolic panel  Chronic hypokalemia- Her potassium is normal now. -     Basic metabolic panel; Future -     Magnesium; Future -     Magnesium -     Basic metabolic panel  Chronic hyperglycemia- Her A1c is 5.9%. -     Hemoglobin A1c; Future -     Hemoglobin A1c  DOE (dyspnea on exertion) -     ECHOCARDIOGRAM COMPLETE;  Future   I am having Genea Taher maintain her lamoTRIgine, labetalol, levothyroxine, doxycycline, and naproxen.  No orders of the defined types were placed in this encounter.    Follow-up: Return in about 3 months (around 07/24/2022).  Scarlette Calico, MD

## 2022-04-23 NOTE — Patient Instructions (Signed)

## 2022-04-24 DIAGNOSIS — R0609 Other forms of dyspnea: Secondary | ICD-10-CM | POA: Insufficient documentation

## 2022-04-28 DIAGNOSIS — Z3200 Encounter for pregnancy test, result unknown: Secondary | ICD-10-CM | POA: Diagnosis not present

## 2022-04-28 DIAGNOSIS — F4312 Post-traumatic stress disorder, chronic: Secondary | ICD-10-CM | POA: Diagnosis not present

## 2022-04-28 DIAGNOSIS — F332 Major depressive disorder, recurrent severe without psychotic features: Secondary | ICD-10-CM | POA: Diagnosis not present

## 2022-04-30 ENCOUNTER — Other Ambulatory Visit: Payer: Self-pay

## 2022-04-30 ENCOUNTER — Encounter (HOSPITAL_COMMUNITY): Payer: Self-pay

## 2022-04-30 ENCOUNTER — Inpatient Hospital Stay (HOSPITAL_COMMUNITY)
Admission: AD | Admit: 2022-04-30 | Discharge: 2022-04-30 | Disposition: A | Discharge status: Home / Self Care | Payer: BC Managed Care – PPO | Attending: Obstetrics & Gynecology | Admitting: Obstetrics & Gynecology

## 2022-04-30 ENCOUNTER — Inpatient Hospital Stay (HOSPITAL_COMMUNITY): Payer: BC Managed Care – PPO

## 2022-04-30 DIAGNOSIS — R102 Pelvic and perineal pain: Secondary | ICD-10-CM | POA: Diagnosis not present

## 2022-04-30 DIAGNOSIS — I10 Essential (primary) hypertension: Secondary | ICD-10-CM | POA: Diagnosis not present

## 2022-04-30 DIAGNOSIS — N750 Cyst of Bartholin's gland: Secondary | ICD-10-CM | POA: Insufficient documentation

## 2022-04-30 DIAGNOSIS — N9489 Other specified conditions associated with female genital organs and menstrual cycle: Secondary | ICD-10-CM

## 2022-04-30 DIAGNOSIS — O09521 Supervision of elderly multigravida, first trimester: Secondary | ICD-10-CM | POA: Diagnosis not present

## 2022-04-30 DIAGNOSIS — Z0001 Encounter for general adult medical examination with abnormal findings: Secondary | ICD-10-CM

## 2022-04-30 DIAGNOSIS — O26891 Other specified pregnancy related conditions, first trimester: Secondary | ICD-10-CM | POA: Diagnosis not present

## 2022-04-30 DIAGNOSIS — Z3A01 Less than 8 weeks gestation of pregnancy: Secondary | ICD-10-CM | POA: Diagnosis not present

## 2022-04-30 DIAGNOSIS — E282 Polycystic ovarian syndrome: Secondary | ICD-10-CM | POA: Diagnosis not present

## 2022-04-30 DIAGNOSIS — N8311 Corpus luteum cyst of right ovary: Secondary | ICD-10-CM | POA: Insufficient documentation

## 2022-04-30 DIAGNOSIS — F39 Unspecified mood [affective] disorder: Secondary | ICD-10-CM | POA: Diagnosis not present

## 2022-04-30 DIAGNOSIS — R109 Unspecified abdominal pain: Secondary | ICD-10-CM | POA: Diagnosis not present

## 2022-04-30 DIAGNOSIS — Z3169 Encounter for other general counseling and advice on procreation: Secondary | ICD-10-CM | POA: Diagnosis not present

## 2022-04-30 DIAGNOSIS — Z3A00 Weeks of gestation of pregnancy not specified: Secondary | ICD-10-CM | POA: Diagnosis not present

## 2022-04-30 DIAGNOSIS — R188 Other ascites: Secondary | ICD-10-CM | POA: Diagnosis not present

## 2022-04-30 HISTORY — DX: Anxiety disorder, unspecified: F41.9

## 2022-04-30 HISTORY — DX: Endometriosis, unspecified: N80.9

## 2022-04-30 HISTORY — DX: Polycystic ovarian syndrome: E28.2

## 2022-04-30 HISTORY — DX: Depression, unspecified: F32.A

## 2022-04-30 LAB — ABO/RH: ABO/RH(D): O POS

## 2022-04-30 LAB — POCT PREGNANCY, URINE: Preg Test, Ur: POSITIVE — AB

## 2022-04-30 LAB — URINALYSIS, ROUTINE W REFLEX MICROSCOPIC
Bilirubin Urine: NEGATIVE
Glucose, UA: NEGATIVE mg/dL
Hgb urine dipstick: NEGATIVE
Ketones, ur: NEGATIVE mg/dL
Leukocytes,Ua: NEGATIVE
Nitrite: NEGATIVE
Protein, ur: NEGATIVE mg/dL
Specific Gravity, Urine: 1.017 (ref 1.005–1.030)
pH: 5 (ref 5.0–8.0)

## 2022-04-30 LAB — WET PREP, GENITAL
Clue Cells Wet Prep HPF POC: NONE SEEN
Sperm: NONE SEEN
Trich, Wet Prep: NONE SEEN
WBC, Wet Prep HPF POC: <10 (ref <10)
Yeast Wet Prep HPF POC: NONE SEEN

## 2022-04-30 LAB — CBC
HCT: 37.1 % (ref 36.0–46.0)
Hemoglobin: 13 g/dL (ref 12.0–15.0)
MCH: 31.4 pg (ref 26.0–34.0)
MCHC: 35 g/dL (ref 30.0–36.0)
MCV: 89.6 fL (ref 80.0–100.0)
Platelets: 278 10*3/uL (ref 150–400)
RBC: 4.14 MIL/uL (ref 3.87–5.11)
RDW: 14.5 % (ref 11.5–15.5)
WBC: 13.7 10*3/uL — ABNORMAL HIGH (ref 4.0–10.5)
nRBC: 0 % (ref 0.0–0.2)

## 2022-04-30 LAB — HCG, QUANTITATIVE, PREGNANCY: hCG, Beta Chain, Quant, S: 2327 m[IU]/mL — ABNORMAL HIGH (ref <5)

## 2022-04-30 NOTE — MAU Provider Note (Addendum)
History     CSN: [id number]  Arrival date and time: 04/30/22 1201   Event Date/Time   First Provider Initiated Contact with Patient 04/30/22 1348      Chief Complaint  Patient presents with   Vaginal Pain   HPI Alexandra Escobar is a 41 y.o. at [redacted]w[redacted]d here in MAU reporting: 2 weeks ago went to urgent care due to bartholin's cyst. Pain is located on left side near introitus. States they could not drain it because it was too deep. Was sent home on doxycycline. She took approx 7 doses and stopped abx when she found out she was pregnant. Of note, she had also taken ibuprofen x1 dose, aspirin, and tylenol for pn relief.   Also having some mild constant cramping x4 days. Denies VB and LOF.   LMP: 03/28/2022   Onset of complaint: ongoing   Pain score: labial 6/10, abdominal 5/10   OB History     Gravida  2   Para      Term      Preterm      AB  1   Living         SAB  1   IAB      Ectopic      Multiple      Live Births              Past Medical History:  Diagnosis Date   Anxiety    Depression    Endometriosis    Fatty liver    Hypertension    PCOS (polycystic ovarian syndrome)     Past Surgical History:  Procedure Laterality Date   DILATION AND CURETTAGE OF UTERUS  06/2021   LAPAROSCOPY     for endometriosis   NASAL SEPTUM SURGERY     OTHER SURGICAL HISTORY     fibroid removal   OTHER SURGICAL HISTORY     fistula drainage    Family History  Problem Relation Age of Onset   Hypertension Father    Depression Father    Hypertension Sister    Cancer Maternal Grandmother        breast   Hypertension Paternal Grandmother     Social History   Tobacco Use   Smoking status: Never    Passive exposure: Never   Smokeless tobacco: Never  Substance Use Topics   Alcohol use: Never   Drug use: Never    Allergies: No Known Allergies  Medications Prior to Admission  Medication Sig Dispense Refill Last Dose   doxycycline (VIBRAMYCIN) 100 MG capsule  Take 1 capsule (100 mg total) by mouth 2 (two) times daily. 14 capsule 0 Past Week   labetalol (NORMODYNE) 200 MG tablet TAKE 1 TABLET(200 MG) BY MOUTH TWICE DAILY 180 tablet 0 04/29/2022   lamoTRIgine (LAMICTAL) 25 MG tablet Take 100 mg by mouth 2 (two) times daily.   04/30/2022   levothyroxine (SYNTHROID) 50 MCG tablet Take 1 tablet (50 mcg total) by mouth daily. 90 tablet 0 04/30/2022   Prenatal Vit-Fe Fumarate-FA (PRENATAL MULTIVITAMIN) TABS tablet Take 1 tablet by mouth daily at 12 noon.   04/29/2022   naproxen (NAPROSYN) 500 MG tablet Take 1 tablet (500 mg total) by mouth 2 (two) times daily. 30 tablet 0     Review of Systems  Gastrointestinal:  Positive for abdominal pain. Negative for rectal pain.  Genitourinary:  Positive for vaginal pain. Negative for difficulty urinating, dyspareunia, dysuria, enuresis, flank pain, hematuria, pelvic pain, urgency, vaginal bleeding and vaginal  discharge.  All other systems reviewed and are negative.  Physical Exam   Blood pressure 139/89, pulse 76, temperature 98.3 F (36.8 C), temperature source Oral, resp. rate 16, height 5\' 2"  (1.575 m), weight 89.5 kg, last menstrual period 03/28/2022, SpO2 99 %.  Physical Exam Vitals and nursing note reviewed. Exam conducted with a chaperone present.  Constitutional:      Appearance: Normal appearance.  Genitourinary:    Comments: Fluid-filled pocket on L introitus Neurological:     Mental Status: She is alert.     Results for orders placed or performed during the hospital encounter of 04/30/22 (from the past 24 hour(s))  Pregnancy, urine POC     Status: Abnormal   Collection Time: 04/30/22 12:10 PM  Result Value Ref Range   Preg Test, Ur POSITIVE (A) NEGATIVE  Urinalysis, Routine w reflex microscopic     Status: Abnormal   Collection Time: 04/30/22 12:18 PM  Result Value Ref Range   Color, Urine YELLOW YELLOW   APPearance HAZY (A) CLEAR   Specific Gravity, Urine 1.017 1.005 - 1.030   pH 5.0 5.0  - 8.0   Glucose, UA NEGATIVE NEGATIVE mg/dL   Hgb urine dipstick NEGATIVE NEGATIVE   Bilirubin Urine NEGATIVE NEGATIVE   Ketones, ur NEGATIVE NEGATIVE mg/dL   Protein, ur NEGATIVE NEGATIVE mg/dL   Nitrite NEGATIVE NEGATIVE   Leukocytes,Ua NEGATIVE NEGATIVE  ABO/Rh     Status: None (Preliminary result)   Collection Time: 04/30/22  2:15 PM  Result Value Ref Range   ABO/RH(D) PENDING    06/30/22 OB LESS THAN 14 WEEKS WITH OB TRANSVAGINAL  Result Date: 04/30/2022 CLINICAL DATA:  Abdominal pain in pregnancy EXAM: OBSTETRIC <14 WK 06/30/2022 AND TRANSVAGINAL OB US TECHNIQUE: Both transabdominal and transvaginal ultrasound examinations were performed for complete evaluation of the gestation as well as the maternal uterus, adnexal regions, and pelvic cul-de-sac. Transvaginal technique was performed to assess early pregnancy. COMPARISON:  None Available. FINDINGS: Intrauterine gestational sac: None Yolk sac:  Not seen Embryo:  Not seen Cardiac Activity: Not seen Subchorionic hemorrhage:  None visualized. Maternal uterus/adnexae: There is 11 mm anechoic structure in right ovary, possibly a follicle. Small amount of free fluid is seen in pelvis. IMPRESSION: There is no demonstrable intrauterine gestational sac. If pregnancy test is positive, differential diagnostic possibilities would include very early IUP or failed gestation with complete abortion or ectopic gestation. Serial HCG estimations and follow-up sonogram as clinically warranted should be considered. Small amount of free fluid in pelvis may suggest recent rupture of ovarian cyst or follicle. No dominant adnexal masses are seen. Electronically Signed   By: [place] M.D.   On: 04/30/2022 15:05     MAU Course  Procedures  MDM Lab and 06/30/2022 results received and reviewed. US revealed corpus luteum cyst, indicative of early pregnancy. Pt has f/u appt w/ fertility specialist next week and will have repeat US done then. Per Dr. [place], serial Hcg level  testing not warranted at this time. Pt instructed to complete SITZ baths, warm compresses, and Tylenol for fluid-filled pocket on L vaginal wall. Pt stable for discharge home. Return precautions D/W pt.  Assessment and Plan  Alexandra Escobar revealed corpus luteum cyst, indicative of early pregnancy. F/u as scheduled w/ fertility specialist. Fluid-filled pocket in L vaginal introitus.  Alexandra A Bolding, PA-S 04/30/2022, 1:54 PM    CNM attestation:  I have seen and examined this patient and agree with above documentation in the PA student's note.   Alexandra  Escobar is a 41 y.o. G2P0010 at [redacted]w[redacted]d by LMP who presents to MAU for possible Bartholin's cyst and lower abdominal cramping. Patient reports she was seen by urgent care approximately 2 weeks ago for bartholin's cyst. She reports she was told that it was "too deep to drain" so she was given doxycycline to take. She reports that she took 7 doses of the antibiotic and then found out she was pregnant so she stopped taking it. She report that she has had lower abdominal cramping since Friday. She denies vaginal bleeding or discharge. She reports some vaginal odor, but no itching/irritation, fever, or urinary s/s.   Of note, she reports that she does have an appointment with [facility] next Friday.  PE: Patient Vitals for the past 24 hrs:  BP Temp Temp src Pulse Resp SpO2 Height Weight  04/30/22 1555 116/70 -- -- 81 -- -- -- --  04/30/22 1229 139/89 98.3 F (36.8 C) Oral 76 16 99 % -- --  04/30/22 1224 -- -- -- -- -- -- 5\' 2"  (1.575 m) 89.5 kg   Gen: calm, comfortable, no distress Resp: normal effort, no distress Heart: regular rate Abd: soft, non-tender Pelvic: normal external female genitalia, small approximate 1cm fluid-filled cyst on vaginal wall, no erythema, no drainage  ROS, labs, PMH reviewed  Orders Placed This Encounter  Procedures   Wet prep, genital   US OB LESS THAN 14 WEEKS WITH OB TRANSVAGINAL   US OB Transvaginal   Urinalysis,  Routine w reflex microscopic   CBC   hCG, quantitative, pregnancy   Pregnancy, urine POC   ABO/Rh   Discharge patient   No orders of the defined types were placed in this encounter.  US OB LESS THAN 14 WEEKS WITH OB TRANSVAGINAL  Result Date: 04/30/2022 CLINICAL DATA:  Abdominal pain in pregnancy EXAM: OBSTETRIC <14 WK [place] AND TRANSVAGINAL OB US TECHNIQUE: Both transabdominal and transvaginal ultrasound examinations were performed for complete evaluation of the gestation as well as the maternal uterus, adnexal regions, and pelvic cul-de-sac. Transvaginal technique was performed to assess early pregnancy. COMPARISON:  None Available. FINDINGS: Intrauterine gestational sac: None Yolk sac:  Not seen Embryo:  Not seen Cardiac Activity: Not seen Subchorionic hemorrhage:  None visualized. Maternal uterus/adnexae: There is 11 mm anechoic structure in right ovary, possibly a follicle. Small amount of free fluid is seen in pelvis. IMPRESSION: There is no demonstrable intrauterine gestational sac. If pregnancy test is positive, differential diagnostic possibilities would include very early IUP or failed gestation with complete abortion or ectopic gestation. Serial HCG estimations and follow-up sonogram as clinically warranted should be considered. Small amount of free fluid in pelvis may suggest recent rupture of ovarian cyst or follicle. No dominant adnexal masses are seen. Electronically Signed   By: Elmer Picker M.D.   On: 04/30/2022 15:05    MDM UA, CBC, HCG, ABO/RH Wet prep, GC/CT Ultrasound   I do not think cyst is a bartholin's cyst as it is more on the vaginal wall. More fluid-filled.   UA and labs unremarkable. Hcg >2300. Ultrasound with results as above. Dr. Elonda Husky reviewed labs and ultrasound and he interprets that ultrasound shows early intrauterine fluid collection c/w intrauterine pregnancy with a corpus luteum cyst of the right ovary. He recommends repeat ultrasound in 2 weeks. Does not  need repeat bHCG in 2 days.   Patient instructed to not resume doxy rx. She may use Tylenol prn for pain. Avoid NSAID's such as Ibuprofen. Would recommend warm compress/sitz  bath for vaginal cyst.   Assessment: - [redacted] weeks gestation of pregnancy - Corpus luteal cyst - Vaginal cyst   Plan: - Discharge home in stable condition - Outpatient viability ultrasound ordered in 2 weeks - Strict return precautions reviewed at length with patient. Return to MAU as needed for new/worsening symptoms - Keep appointment as scheduled at [facility] on Friday   Joliet Mallozzi L Nancyjo Givhan,CNM 04/30/2022 4:30 PM

## 2022-04-30 NOTE — MAU Note (Signed)
Alexandra Escobar is a 41 y.o. at [redacted]w[redacted]d here in MAU reporting: 2 weeks ago went to urgent care due to bartholin's cyst. States they could not drain it because it was to deep. Was sent home on abx. Stopped abx when she found out she was pregnant. Also having some mild cramping but no bleeding.   LMP: 03/28/2022  Onset of complaint: ongoing  Pain score: labial 6/10, abdominal 5/10  Vitals:   04/30/22 1229  BP: 139/89  Pulse: 76  Resp: 16  Temp: 98.3 F (36.8 C)  SpO2: 99%     Lab orders placed from triage: upt, ua

## 2022-05-01 LAB — GC/CHLAMYDIA PROBE AMP (~~LOC~~) NOT AT ARMC
Chlamydia: NEGATIVE
Comment: NEGATIVE
Comment: NORMAL
Neisseria Gonorrhea: NEGATIVE

## 2022-05-04 ENCOUNTER — Encounter: Payer: Self-pay | Admitting: Internal Medicine

## 2022-05-05 DIAGNOSIS — F331 Major depressive disorder, recurrent, moderate: Secondary | ICD-10-CM | POA: Diagnosis not present

## 2022-05-05 DIAGNOSIS — Z630 Problems in relationship with spouse or partner: Secondary | ICD-10-CM | POA: Diagnosis not present

## 2022-05-06 DIAGNOSIS — F4312 Post-traumatic stress disorder, chronic: Secondary | ICD-10-CM | POA: Diagnosis not present

## 2022-05-06 DIAGNOSIS — F410 Panic disorder [episodic paroxysmal anxiety] without agoraphobia: Secondary | ICD-10-CM | POA: Diagnosis not present

## 2022-05-06 DIAGNOSIS — Z3200 Encounter for pregnancy test, result unknown: Secondary | ICD-10-CM | POA: Diagnosis not present

## 2022-05-06 DIAGNOSIS — F3181 Bipolar II disorder: Secondary | ICD-10-CM | POA: Diagnosis not present

## 2022-05-07 ENCOUNTER — Ambulatory Visit: Payer: BC Managed Care – PPO | Admitting: Internal Medicine

## 2022-05-09 DIAGNOSIS — Z3200 Encounter for pregnancy test, result unknown: Secondary | ICD-10-CM | POA: Diagnosis not present

## 2022-05-12 DIAGNOSIS — F332 Major depressive disorder, recurrent severe without psychotic features: Secondary | ICD-10-CM | POA: Diagnosis not present

## 2022-05-12 DIAGNOSIS — F4312 Post-traumatic stress disorder, chronic: Secondary | ICD-10-CM | POA: Diagnosis not present

## 2022-05-15 DIAGNOSIS — F39 Unspecified mood [affective] disorder: Secondary | ICD-10-CM | POA: Diagnosis not present

## 2022-05-15 DIAGNOSIS — I10 Essential (primary) hypertension: Secondary | ICD-10-CM | POA: Diagnosis not present

## 2022-05-15 DIAGNOSIS — E282 Polycystic ovarian syndrome: Secondary | ICD-10-CM | POA: Diagnosis not present

## 2022-05-15 DIAGNOSIS — O09519 Supervision of elderly primigravida, unspecified trimester: Secondary | ICD-10-CM | POA: Diagnosis not present

## 2022-05-16 ENCOUNTER — Other Ambulatory Visit: Payer: Self-pay | Admitting: Internal Medicine

## 2022-05-16 DIAGNOSIS — I10 Essential (primary) hypertension: Secondary | ICD-10-CM

## 2022-05-16 DIAGNOSIS — O200 Threatened abortion: Secondary | ICD-10-CM | POA: Diagnosis not present

## 2022-05-16 DIAGNOSIS — Z630 Problems in relationship with spouse or partner: Secondary | ICD-10-CM | POA: Diagnosis not present

## 2022-05-16 DIAGNOSIS — F331 Major depressive disorder, recurrent, moderate: Secondary | ICD-10-CM | POA: Diagnosis not present

## 2022-05-19 ENCOUNTER — Ambulatory Visit (INDEPENDENT_AMBULATORY_CARE_PROVIDER_SITE_OTHER): Payer: BC Managed Care – PPO

## 2022-05-19 DIAGNOSIS — R0609 Other forms of dyspnea: Secondary | ICD-10-CM | POA: Diagnosis not present

## 2022-05-19 LAB — ECHOCARDIOGRAM COMPLETE
Area-P 1/2: 3.85 cm2
S' Lateral: 2.19 cm

## 2022-05-23 DIAGNOSIS — O200 Threatened abortion: Secondary | ICD-10-CM | POA: Diagnosis not present

## 2022-05-26 DIAGNOSIS — F332 Major depressive disorder, recurrent severe without psychotic features: Secondary | ICD-10-CM | POA: Diagnosis not present

## 2022-05-26 DIAGNOSIS — F4312 Post-traumatic stress disorder, chronic: Secondary | ICD-10-CM | POA: Diagnosis not present

## 2022-05-29 DIAGNOSIS — Z3161 Procreative counseling and advice using natural family planning: Secondary | ICD-10-CM | POA: Diagnosis not present

## 2022-05-29 DIAGNOSIS — E288 Other ovarian dysfunction: Secondary | ICD-10-CM | POA: Diagnosis not present

## 2022-05-29 DIAGNOSIS — N979 Female infertility, unspecified: Secondary | ICD-10-CM | POA: Diagnosis not present

## 2022-05-29 DIAGNOSIS — O200 Threatened abortion: Secondary | ICD-10-CM | POA: Diagnosis not present

## 2022-06-02 DIAGNOSIS — N96 Recurrent pregnancy loss: Secondary | ICD-10-CM | POA: Diagnosis not present

## 2022-06-02 DIAGNOSIS — O021 Missed abortion: Secondary | ICD-10-CM | POA: Diagnosis not present

## 2022-06-05 ENCOUNTER — Encounter: Payer: Self-pay | Admitting: Internal Medicine

## 2022-06-09 DIAGNOSIS — R531 Weakness: Secondary | ICD-10-CM | POA: Diagnosis not present

## 2022-06-09 DIAGNOSIS — O039 Complete or unspecified spontaneous abortion without complication: Secondary | ICD-10-CM | POA: Diagnosis not present

## 2022-06-09 DIAGNOSIS — F410 Panic disorder [episodic paroxysmal anxiety] without agoraphobia: Secondary | ICD-10-CM | POA: Diagnosis not present

## 2022-06-09 DIAGNOSIS — I10 Essential (primary) hypertension: Secondary | ICD-10-CM | POA: Diagnosis not present

## 2022-06-09 DIAGNOSIS — F319 Bipolar disorder, unspecified: Secondary | ICD-10-CM | POA: Diagnosis not present

## 2022-06-09 DIAGNOSIS — O909 Complication of the puerperium, unspecified: Secondary | ICD-10-CM | POA: Diagnosis not present

## 2022-06-09 DIAGNOSIS — R45851 Suicidal ideations: Secondary | ICD-10-CM | POA: Diagnosis not present

## 2022-06-09 DIAGNOSIS — Z79899 Other long term (current) drug therapy: Secondary | ICD-10-CM | POA: Diagnosis not present

## 2022-06-09 DIAGNOSIS — F32A Depression, unspecified: Secondary | ICD-10-CM | POA: Diagnosis not present

## 2022-06-09 DIAGNOSIS — F4320 Adjustment disorder, unspecified: Secondary | ICD-10-CM | POA: Diagnosis not present

## 2022-06-09 DIAGNOSIS — F4312 Post-traumatic stress disorder, chronic: Secondary | ICD-10-CM | POA: Diagnosis not present

## 2022-06-09 DIAGNOSIS — F332 Major depressive disorder, recurrent severe without psychotic features: Secondary | ICD-10-CM | POA: Diagnosis not present

## 2022-06-09 DIAGNOSIS — I959 Hypotension, unspecified: Secondary | ICD-10-CM | POA: Diagnosis not present

## 2022-06-10 ENCOUNTER — Encounter: Payer: BC Managed Care – PPO | Admitting: Obstetrics and Gynecology

## 2022-06-10 DIAGNOSIS — Z79899 Other long term (current) drug therapy: Secondary | ICD-10-CM | POA: Diagnosis not present

## 2022-06-10 DIAGNOSIS — I959 Hypotension, unspecified: Secondary | ICD-10-CM | POA: Diagnosis not present

## 2022-06-10 DIAGNOSIS — I10 Essential (primary) hypertension: Secondary | ICD-10-CM | POA: Diagnosis not present

## 2022-06-10 DIAGNOSIS — F4320 Adjustment disorder, unspecified: Secondary | ICD-10-CM | POA: Diagnosis not present

## 2022-06-11 ENCOUNTER — Ambulatory Visit: Payer: BC Managed Care – PPO | Admitting: Internal Medicine

## 2022-06-11 DIAGNOSIS — F410 Panic disorder [episodic paroxysmal anxiety] without agoraphobia: Secondary | ICD-10-CM | POA: Diagnosis not present

## 2022-06-11 DIAGNOSIS — F332 Major depressive disorder, recurrent severe without psychotic features: Secondary | ICD-10-CM | POA: Diagnosis not present

## 2022-06-11 DIAGNOSIS — F4312 Post-traumatic stress disorder, chronic: Secondary | ICD-10-CM | POA: Diagnosis not present

## 2022-06-17 ENCOUNTER — Ambulatory Visit: Payer: BC Managed Care – PPO | Admitting: Internal Medicine

## 2022-06-17 DIAGNOSIS — F314 Bipolar disorder, current episode depressed, severe, without psychotic features: Secondary | ICD-10-CM | POA: Diagnosis not present

## 2022-06-18 DIAGNOSIS — F314 Bipolar disorder, current episode depressed, severe, without psychotic features: Secondary | ICD-10-CM | POA: Diagnosis not present

## 2022-06-19 DIAGNOSIS — F314 Bipolar disorder, current episode depressed, severe, without psychotic features: Secondary | ICD-10-CM | POA: Diagnosis not present

## 2022-06-20 DIAGNOSIS — F314 Bipolar disorder, current episode depressed, severe, without psychotic features: Secondary | ICD-10-CM | POA: Diagnosis not present

## 2022-06-22 ENCOUNTER — Encounter: Payer: Self-pay | Admitting: Internal Medicine

## 2022-06-23 DIAGNOSIS — F314 Bipolar disorder, current episode depressed, severe, without psychotic features: Secondary | ICD-10-CM | POA: Diagnosis not present

## 2022-06-24 DIAGNOSIS — F314 Bipolar disorder, current episode depressed, severe, without psychotic features: Secondary | ICD-10-CM | POA: Diagnosis not present

## 2022-06-25 DIAGNOSIS — F314 Bipolar disorder, current episode depressed, severe, without psychotic features: Secondary | ICD-10-CM | POA: Diagnosis not present

## 2022-06-26 ENCOUNTER — Ambulatory Visit: Payer: BC Managed Care – PPO | Admitting: Internal Medicine

## 2022-06-26 ENCOUNTER — Encounter: Payer: Self-pay | Admitting: Internal Medicine

## 2022-06-26 VITALS — BP 138/88 | HR 78 | Temp 98.4°F | Resp 16 | Ht 62.0 in | Wt 187.0 lb

## 2022-06-26 DIAGNOSIS — H1012 Acute atopic conjunctivitis, left eye: Secondary | ICD-10-CM | POA: Insufficient documentation

## 2022-06-26 DIAGNOSIS — I10 Essential (primary) hypertension: Secondary | ICD-10-CM

## 2022-06-26 DIAGNOSIS — F314 Bipolar disorder, current episode depressed, severe, without psychotic features: Secondary | ICD-10-CM | POA: Diagnosis not present

## 2022-06-26 DIAGNOSIS — R3589 Other polyuria: Secondary | ICD-10-CM | POA: Diagnosis not present

## 2022-06-26 DIAGNOSIS — E039 Hypothyroidism, unspecified: Secondary | ICD-10-CM

## 2022-06-26 LAB — CBC WITH DIFFERENTIAL/PLATELET
Basophils Absolute: 0 10*3/uL (ref 0.0–0.1)
Basophils Relative: 0.4 % (ref 0.0–3.0)
Eosinophils Absolute: 0.2 10*3/uL (ref 0.0–0.7)
Eosinophils Relative: 2.7 % (ref 0.0–5.0)
HCT: 40.7 % (ref 36.0–46.0)
Hemoglobin: 13.7 g/dL (ref 12.0–15.0)
Lymphocytes Relative: 38.9 % (ref 12.0–46.0)
Lymphs Abs: 3.2 10*3/uL (ref 0.7–4.0)
MCHC: 33.6 g/dL (ref 30.0–36.0)
MCV: 92.6 fl (ref 78.0–100.0)
Monocytes Absolute: 0.6 10*3/uL (ref 0.1–1.0)
Monocytes Relative: 7.5 % (ref 3.0–12.0)
Neutro Abs: 4.1 10*3/uL (ref 1.4–7.7)
Neutrophils Relative %: 50.5 % (ref 43.0–77.0)
Platelets: 290 10*3/uL (ref 150.0–400.0)
RBC: 4.39 Mil/uL (ref 3.87–5.11)
RDW: 14.5 % (ref 11.5–15.5)
WBC: 8.1 10*3/uL (ref 4.0–10.5)

## 2022-06-26 LAB — URINALYSIS, ROUTINE W REFLEX MICROSCOPIC
Bilirubin Urine: NEGATIVE
Hgb urine dipstick: NEGATIVE
Ketones, ur: NEGATIVE
Leukocytes,Ua: NEGATIVE
Nitrite: NEGATIVE
RBC / HPF: NONE SEEN (ref 0–?)
Specific Gravity, Urine: 1.01 (ref 1.000–1.030)
Total Protein, Urine: NEGATIVE
Urine Glucose: NEGATIVE
Urobilinogen, UA: 0.2 (ref 0.0–1.0)
pH: 6 (ref 5.0–8.0)

## 2022-06-26 LAB — BASIC METABOLIC PANEL
BUN: 9 mg/dL (ref 6–23)
CO2: 26 mEq/L (ref 19–32)
Calcium: 9 mg/dL (ref 8.4–10.5)
Chloride: 107 mEq/L (ref 96–112)
Creatinine, Ser: 0.79 mg/dL (ref 0.40–1.20)
GFR: 94.35 mL/min (ref >60.00)
Glucose, Bld: 102 mg/dL — ABNORMAL HIGH (ref 70–99)
Potassium: 3.7 mEq/L (ref 3.5–5.1)
Sodium: 141 mEq/L (ref 135–145)

## 2022-06-26 LAB — HEMOGLOBIN A1C: Hgb A1c MFr Bld: 5.9 % (ref 4.6–6.5)

## 2022-06-26 LAB — TSH: TSH: 1.46 u[IU]/mL (ref 0.35–5.50)

## 2022-06-26 MED ORDER — NEOMYCIN-POLYMYXIN-DEXAMETH 3.5-10000-0.1 OP SUSP: 2.0000 [drp] | Freq: Four times a day (QID) | OPHTHALMIC | 0 refills | Status: AC

## 2022-06-26 NOTE — Progress Notes (Signed)
Subjective:  Patient ID: Alexandra Escobar, female    DOB: Sep 28, 1980  Age: 41 y.o. MRN: [id number]  CC: Conjunctivitis   HPI Alexandra Escobar presents for f/up -  She complains of a 1 week history of itching and redness over her left lower eyelid.  Outpatient Medications Prior to Visit  Medication Sig Dispense Refill   labetalol (NORMODYNE) 200 MG tablet TAKE 1 TABLET(200 MG) BY MOUTH TWICE DAILY 180 tablet 0   levothyroxine (SYNTHROID) 50 MCG tablet Take 1 tablet (50 mcg total) by mouth daily. 90 tablet 0   lamoTRIgine (LAMICTAL) 25 MG tablet Take 100 mg by mouth 2 (two) times daily.     Prenatal Vit-Fe Fumarate-FA (PRENATAL MULTIVITAMIN) TABS tablet Take 1 tablet by mouth daily at 12 noon.     No facility-administered medications prior to visit.    ROS Review of Systems  Constitutional:  Negative for chills, diaphoresis, fatigue and fever.  HENT: Negative.  Negative for sore throat and trouble swallowing.   Eyes:  Positive for redness and itching. Negative for pain and visual disturbance.  Respiratory: Negative.  Negative for cough, chest tightness and wheezing.   Cardiovascular:  Negative for chest pain, palpitations and leg swelling.  Gastrointestinal:  Negative for abdominal pain, diarrhea, nausea and vomiting.  Endocrine: Positive for polyuria.  Genitourinary:  Positive for frequency. Negative for decreased urine volume, difficulty urinating, dysuria, hematuria and urgency.  Musculoskeletal: Negative.   Skin: Negative.   Neurological: Negative.  Negative for dizziness and weakness.  Hematological:  Negative for adenopathy. Does not bruise/bleed easily.    Objective:  BP 138/88 (BP Location: Left Arm, Patient Position: Sitting, Cuff Size: Large)   Pulse 78   Temp 98.4 F (36.9 C) (Oral)   Resp 16   Ht 5\' 2"  (1.575 m)   Wt 187 lb (84.8 kg)   LMP 06/19/2022 (Exact Date) Comment: neg preg test  SpO2 98%   Breastfeeding No   BMI 34.20 kg/m   BP Readings from Last 3  Encounters:  06/26/22 138/88  04/30/22 116/70  04/23/22 122/78    Wt Readings from Last 3 Encounters:  06/26/22 187 lb (84.8 kg)  04/30/22 197 lb 4.8 oz (89.5 kg)  04/23/22 197 lb (89.4 kg)    Physical Exam Vitals reviewed.  HENT:     Nose: Nose normal.     Mouth/Throat:     Mouth: Mucous membranes are moist.  Eyes:     General:        Right eye: No foreign body, discharge or hordeolum.        Left eye: No foreign body, discharge or hordeolum.     Conjunctiva/sclera:     Right eye: Right conjunctiva is injected. No chemosis, exudate or hemorrhage.    Left eye: Left conjunctiva is injected. No chemosis, exudate or hemorrhage. Cardiovascular:     Rate and Rhythm: Normal rate and regular rhythm.     Heart sounds: No murmur heard. Pulmonary:     Effort: Pulmonary effort is normal.     Breath sounds: No stridor. No wheezing, rhonchi or rales.  Abdominal:     General: Abdomen is flat.     Palpations: There is no mass.     Tenderness: There is no abdominal tenderness. There is no guarding.     Hernia: No hernia is present.  Musculoskeletal:        General: Normal range of motion.     Cervical back: Neck supple.     Right lower  leg: No edema.     Left lower leg: No edema.  Lymphadenopathy:     Cervical: No cervical adenopathy.  Skin:    General: Skin is warm and dry.  Neurological:     General: No focal deficit present.     Mental Status: She is alert.     Lab Results  Component Value Date   WBC 8.1 06/26/2022   HGB 13.7 06/26/2022   HCT 40.7 06/26/2022   PLT 290.0 06/26/2022   GLUCOSE 102 (H) 06/26/2022   ALT 25 12/18/2021   AST 22 12/18/2021   NA 141 06/26/2022   K 3.7 06/26/2022   CL 107 06/26/2022   CREATININE 0.79 06/26/2022   BUN 9 06/26/2022   CO2 26 06/26/2022   TSH 1.46 06/26/2022   HGBA1C 5.9 06/26/2022    US OB LESS THAN 14 WEEKS WITH OB TRANSVAGINAL  Result Date: 04/30/2022 CLINICAL DATA:  Abdominal pain in pregnancy EXAM: OBSTETRIC <14 WK  [place] AND TRANSVAGINAL OB US TECHNIQUE: Both transabdominal and transvaginal ultrasound examinations were performed for complete evaluation of the gestation as well as the maternal uterus, adnexal regions, and pelvic cul-de-sac. Transvaginal technique was performed to assess early pregnancy. COMPARISON:  None Available. FINDINGS: Intrauterine gestational sac: None Yolk sac:  Not seen Embryo:  Not seen Cardiac Activity: Not seen Subchorionic hemorrhage:  None visualized. Maternal uterus/adnexae: There is 11 mm anechoic structure in right ovary, possibly a follicle. Small amount of free fluid is seen in pelvis. IMPRESSION: There is no demonstrable intrauterine gestational sac. If pregnancy test is positive, differential diagnostic possibilities would include very early IUP or failed gestation with complete abortion or ectopic gestation. Serial HCG estimations and follow-up sonogram as clinically warranted should be considered. Small amount of free fluid in pelvis may suggest recent rupture of ovarian cyst or follicle. No dominant adnexal masses are seen. Electronically Signed   By: Ernie Avena M.D.   On: 04/30/2022 15:05    Assessment & Plan:   Alexandra Escobar was seen today for conjunctivitis.  Diagnoses and all orders for this visit:  Acute atopic conjunctivitis of left eye -     neomycin-polymyxin b-dexamethasone (MAXITROL) 3.5-10000-0.1 SUSP; Place 2 drops into both eyes every 6 (six) hours for 7 days.  Primary hypertension- Her blood pressure is adequately well-controlled. -     Urinalysis, Routine w reflex microscopic; Future -     CBC with Differential/Platelet; Future -     Basic metabolic panel; Future -     Basic metabolic panel -     CBC with Differential/Platelet -     Urinalysis, Routine w reflex microscopic  Acquired hypothyroidism- She is euthyroid. -     TSH; Future -     CBC with Differential/Platelet; Future -     CBC with Differential/Platelet -     TSH  Polyuria- Labs are  reassuring. -     Urinalysis, Routine w reflex microscopic; Future -     CULTURE, URINE COMPREHENSIVE; Future -     Hemoglobin A1c; Future -     Basic metabolic panel; Future -     Basic metabolic panel -     Hemoglobin A1c -     CULTURE, URINE COMPREHENSIVE -     Urinalysis, Routine w reflex microscopic   I have discontinued Nyesha Manukyan's lamoTRIgine and prenatal multivitamin. I am also having her start on neomycin-polymyxin b-dexamethasone. Additionally, I am having her maintain her levothyroxine and labetalol.  Meds ordered this encounter  Medications  neomycin-polymyxin b-dexamethasone (MAXITROL) 3.5-10000-0.1 SUSP    Sig: Place 2 drops into both eyes every 6 (six) hours for 7 days.    Dispense:  5 mL    Refill:  0     Follow-up: Return in about 3 months (around 09/25/2022).  Sanda Linger, MD

## 2022-06-28 DIAGNOSIS — N96 Recurrent pregnancy loss: Secondary | ICD-10-CM | POA: Diagnosis not present

## 2022-06-28 LAB — CULTURE, URINE COMPREHENSIVE

## 2022-06-30 DIAGNOSIS — O09519 Supervision of elderly primigravida, unspecified trimester: Secondary | ICD-10-CM | POA: Diagnosis not present

## 2022-06-30 DIAGNOSIS — N96 Recurrent pregnancy loss: Secondary | ICD-10-CM | POA: Diagnosis not present

## 2022-06-30 DIAGNOSIS — E282 Polycystic ovarian syndrome: Secondary | ICD-10-CM | POA: Diagnosis not present

## 2022-06-30 DIAGNOSIS — O0990 Supervision of high risk pregnancy, unspecified, unspecified trimester: Secondary | ICD-10-CM | POA: Diagnosis not present

## 2022-07-01 ENCOUNTER — Encounter: Payer: BC Managed Care – PPO | Admitting: Obstetrics and Gynecology

## 2022-07-01 ENCOUNTER — Ambulatory Visit: Payer: BC Managed Care – PPO | Admitting: Internal Medicine

## 2022-07-01 DIAGNOSIS — F314 Bipolar disorder, current episode depressed, severe, without psychotic features: Secondary | ICD-10-CM | POA: Diagnosis not present

## 2022-07-02 ENCOUNTER — Encounter: Payer: Self-pay | Admitting: Obstetrics and Gynecology

## 2022-07-02 DIAGNOSIS — F314 Bipolar disorder, current episode depressed, severe, without psychotic features: Secondary | ICD-10-CM | POA: Diagnosis not present

## 2022-07-03 ENCOUNTER — Encounter: Payer: Self-pay | Admitting: Internal Medicine

## 2022-07-03 DIAGNOSIS — F314 Bipolar disorder, current episode depressed, severe, without psychotic features: Secondary | ICD-10-CM | POA: Diagnosis not present

## 2022-07-04 DIAGNOSIS — F314 Bipolar disorder, current episode depressed, severe, without psychotic features: Secondary | ICD-10-CM | POA: Diagnosis not present

## 2022-07-07 ENCOUNTER — Other Ambulatory Visit: Payer: Self-pay | Admitting: Internal Medicine

## 2022-07-07 DIAGNOSIS — F314 Bipolar disorder, current episode depressed, severe, without psychotic features: Secondary | ICD-10-CM | POA: Diagnosis not present

## 2022-07-07 DIAGNOSIS — E039 Hypothyroidism, unspecified: Secondary | ICD-10-CM

## 2022-07-08 DIAGNOSIS — F314 Bipolar disorder, current episode depressed, severe, without psychotic features: Secondary | ICD-10-CM | POA: Diagnosis not present

## 2022-07-09 ENCOUNTER — Other Ambulatory Visit: Payer: Self-pay

## 2022-07-09 ENCOUNTER — Ambulatory Visit: Payer: BC Managed Care – PPO | Attending: Obstetrics and Gynecology | Admitting: Physical Therapy

## 2022-07-09 ENCOUNTER — Ambulatory Visit (INDEPENDENT_AMBULATORY_CARE_PROVIDER_SITE_OTHER): Payer: BC Managed Care – PPO | Admitting: Obstetrics and Gynecology

## 2022-07-09 ENCOUNTER — Encounter: Payer: Self-pay | Admitting: Physical Therapy

## 2022-07-09 DIAGNOSIS — Z01419 Encounter for gynecological examination (general) (routine) without abnormal findings: Secondary | ICD-10-CM | POA: Diagnosis not present

## 2022-07-09 DIAGNOSIS — Z1331 Encounter for screening for depression: Secondary | ICD-10-CM

## 2022-07-09 DIAGNOSIS — R1084 Generalized abdominal pain: Secondary | ICD-10-CM | POA: Diagnosis not present

## 2022-07-09 DIAGNOSIS — M62838 Other muscle spasm: Secondary | ICD-10-CM | POA: Insufficient documentation

## 2022-07-09 DIAGNOSIS — Z8759 Personal history of other complications of pregnancy, childbirth and the puerperium: Secondary | ICD-10-CM

## 2022-07-09 DIAGNOSIS — R102 Pelvic and perineal pain: Secondary | ICD-10-CM | POA: Insufficient documentation

## 2022-07-09 DIAGNOSIS — M6289 Other specified disorders of muscle: Secondary | ICD-10-CM | POA: Insufficient documentation

## 2022-07-09 MED ORDER — PRENATAL 28-0.8 MG PO TABS: 1.0000 | ORAL_TABLET | Freq: Every day | ORAL | 3 refills | Status: DC

## 2022-07-09 MED ORDER — COENZYME Q10 30 MG PO CAPS: 30.0000 mg | ORAL_CAPSULE | Freq: Every day | ORAL | 3 refills | Status: DC

## 2022-07-09 NOTE — Therapy (Signed)
OUTPATIENT PHYSICAL THERAPY FEMALE PELVIC EVALUATION   Patient Name: Alexandra Escobar MRN: [id number] DOB:1981/03/25, 41 y.o., female Today's Date: 07/09/2022  END OF SESSION:  PT End of Session - 07/09/22 1617     Visit Number 1    Date for PT Re-Evaluation 10/01/22    Authorization Type BCBS    PT Start Time 1530    PT Stop Time 1610    PT Time Calculation (min) 40 min    Activity Tolerance Patient tolerated treatment well    Behavior During Therapy WFL for tasks assessed/performed             Past Medical History:  Diagnosis Date   Anxiety    Depression    Endometriosis    Fatty liver    Hypertension    PCOS (polycystic ovarian syndrome)    Past Surgical History:  Procedure Laterality Date   DILATION AND CURETTAGE OF UTERUS  06/2021   LAPAROSCOPY     for endometriosis   NASAL SEPTUM SURGERY     OTHER SURGICAL HISTORY     fibroid removal   OTHER SURGICAL HISTORY     fistula drainage   Patient Active Problem List   Diagnosis Date Noted   Acute atopic conjunctivitis of left eye 06/26/2022   Polyuria 06/26/2022   Hypertension    Severe episode of recurrent major depressive disorder, without psychotic features ([facility]) 12/18/2021   Acquired hypothyroidism 10/22/2021   Encounter for general adult medical examination with abnormal findings 10/21/2021    PCP: Janith Lima, MD  REFERRING PROVIDER: Inez Catalina, MD   REFERRING DIAG: [phone] (ICD-10-CM) - Levator spasm   THERAPY DIAG:  Other muscle spasm  Generalized abdominal pain  Pelvic pain  Rationale for Evaluation and Treatment: Rehabilitation  ONSET DATE: 06/20/2022  SUBJECTIVE:                                                                                                                                                                                           SUBJECTIVE STATEMENT: Patient had a 9 week miscarriage. She started feeling spasm in the lower pelvic area. Patient has to go to  the rest room more often. Wakes up 3-4 times per night. Intercourse is becoming pain. Clearance to have intercourse after 2 weeks. This morning the doctor checked her and she had pain.  Fluid intake: Yes: water    PAIN:  Are you having pain? Yes NPRS scale: 7/10 Pain location:  vaginal entrance and lower abdomen  Pain type: spasms Pain description: intermittent   Aggravating factors: penile penetration, sitting,  Relieving factors: heat  PRECAUTIONS: None  WEIGHT BEARING  RESTRICTIONS: No  FALLS:  Has patient fallen in last 6 months? No  LIVING ENVIRONMENT: Lives with: lives with their family   OCCUPATION: sitting and works at a bank; getting back into walking  PLOF: Independent  PATIENT GOALS: reduce pain with sitting, decrease pressure, reduce muscle tension, reduce pain with intercourse  PERTINENT HISTORY:  PCOS; Laproscopy for endo;   BOWEL MOVEMENT: Pain with bowel movement: No   URINATION: Pain with urination: No Fully empty bladder: No needs to wait to get the remainder of the urine out Stream: Strong Urgency: Yes: becoming stronger Frequency: 2 hours during the day; nighttime void 3 hours Leakage: Urge to void, Walking to the bathroom, Coughing, Sneezing, and Laughing Pads: No  INTERCOURSE: Pain with intercourse: During Penetration Ability to have vaginal penetration:  Yes:   Climax: yes Marinoff Scale: 1/3  PREGNANCY: miscarriages: 2 in 1 year    OBJECTIVE:    COGNITION: Overall cognitive status: Within functional limits for tasks assessed     SENSATION: Light touch: Appears intact Proprioception: Appears intact   LUMBAR SPECIAL TESTS:  SI Compression/distraction test: Positive and Gaenslen's test: Positive on the right   POSTURE: rounded shoulders, forward head, and decreased lumbar lordosis  PELVIC ALIGNMENT:  LUMBARAROM/PROM:  A/PROM A/PROM  eval  Flexion Full with tightness in the lumbar region  Extension Full   Right  lateral flexion Decreased by 25%  Left lateral flexion Decreased by 25%  Right rotation Decreased by 25%  Left rotation Decreased by 25%   (Blank rows = not tested)  LOWER EXTREMITY ROM: Bilateral hip ROM is full    LOWER EXTREMITY MMT:  MMT Right eval Left eval  Hip abduction 4/5 4/5  Hip adduction 4/5 4/5   PALPATION:   General  when contracts the lower abdomen she has a cramp in the right lower quadrant; tenderness located along the lower abdominals, along the pubic bone, inner thighs                External Perineal Exam tenderness located on the ischiocavernosus, bulbocavernosus, perineal body; Q-tip test with  pain at 111,12,1 O'clock; The vulvar area is red                             Internal Pelvic Floor tenderness located on bilateral levator ani, obturator internist, along the urethra, sides of the bladder  Patient confirms identification and approves PT to assess internal pelvic floor and treatment Yes  PELVIC MMT:   MMT eval  Vaginal 4/5 with pain on right side of pelvic floor          TONE: increased   TODAY'S TREATMENT:                                                                                                                              DATE: 07/09/22  EVAL See below   PATIENT EDUCATION:  Education details:  gave patient you tube videos for pelvic floor meditation and perineal massage Person educated: Patient Education method: Explanation, Demonstration, and you tube Education comprehension: verbalized understanding and needs further education  HOME EXERCISE PROGRAM: See above.   ASSESSMENT:  CLINICAL IMPRESSION: Patient is a 41 y.o. female who was seen today for physical therapy evaluation and treatment for levator spasm.  Patient is s/p D&C  on 11/13 /23 after 9 weeks of pregnancy. She started to have pelvic pain, urgency and urinary leakage the past 3 weeks. Patient reports intermittent pain at level 7/10 at the entrance of the vagina and  lower abdominal. Her pain is worse with sitting and penile penetration vaginally. Patient reports she leaks urine with urge to void, walking to the bathroom, coughing, sneezing, and laughing. She has to void frequently especially at night. She will wait after a period of time after urinating to fully empty her bladder. She has tenderness in the lower abdomen, along the levator ani, obturator internist, urethra, sides of the bladder and first layer of the pelvic floor. She has a positive q-tip test at 11, 12, 1 O'clock. Pelvic floor strength is 4/5. Patient will benefit from skilled therapy to work on reduction of trigger points in the pelvic floor, abdomen to reduce her pain and improve her strength.   OBJECTIVE IMPAIRMENTS: decreased activity tolerance, decreased coordination, decreased endurance, decreased ROM, decreased strength, increased fascial restrictions, increased muscle spasms, impaired tone, and pain.   ACTIVITY LIMITATIONS: sitting, sleeping, and continence  PARTICIPATION LIMITATIONS: interpersonal relationship, driving, shopping, community activity, and occupation  PERSONAL FACTORS: Past/current experiences, Time since onset of injury/illness/exacerbation, and 3+ comorbidities: PCOS; Laproscopy for endo;   are also affecting patient's functional outcome.   REHAB POTENTIAL: Excellent  CLINICAL DECISION MAKING: Evolving/moderate complexity  EVALUATION COMPLEXITY: Moderate   GOALS: Goals reviewed with patient? Yes  SHORT TERM GOALS: Target date: 08/06/2022  Patient independent with pain management using diaphragmatic breathing, meditation and perineal massage.  Baseline: Goal status: INITIAL  2.  Patient has a negative Q-tip test due to improve tissue mobility and reduction of trigger points.  Baseline:  Goal status: INITIAL  3.  Patient is able to contract her abdominal with muscle spasm in the right lower quadrant.  Baseline:  Goal status: INITIAL  LONG TERM GOALS:  Target date: 10/01/2022  Patient independent with advanced HEP for pelvic floor, core and hip strength to reduce leakage.  Baseline:  Goal status: INITIAL  2.  Patient is able to have penile penetration vaginally with 0-1/10 pain decreased in pelvic floor trigger points.  Baseline:  Goal status: INITIAL  3.  Patient is able to sit for 45 minutes to an our with pain level 0-1/10 due to elongation of her pelvic floor muscles.  Baseline:  Goal status: INITIAL  4.  Patient is able to resume her workouts with core engagement with pain level 0-1/10 due to reduction of trigger points.  Baseline:  Goal status: INITIAL  5.  Patient reports her urinary leakage is less than </= 75% due to improved contraction of muscles without spasms.  Baseline:  Goal status: INITIAL   PLAN:  PT FREQUENCY: 1x/week  PT DURATION: 12 weeks  PLANNED INTERVENTIONS: Therapeutic exercises, Therapeutic activity, Neuromuscular re-education, Patient/Family education, Joint mobilization, Dry Needling, Electrical stimulation, Cryotherapy, Moist heat, Taping, Ultrasound, Biofeedback, and Manual therapy  PLAN FOR NEXT SESSION: manual work to the lower abdomen and back to improve tissue mobility, improve low rib cage mobility, diaphragmatic breathing, hip stretches   Elnita Maxwell  Pearline Cables, PT 07/09/22 4:40 PM

## 2022-07-09 NOTE — Progress Notes (Signed)
ANNUAL EXAM Patient name: Alexandra Escobar MRN [id number]  Date of birth: 1981-05-12 Chief Complaint:   Initial Prenatal Visit and Annual Exam  History of Present Illness:   Alexandra Escobar is a 41 y.o. G2P0020 with Patient's last menstrual period was 06/19/2022 (exact date). being seen today for a routine annual exam.   Current complaints:   Recent SAB - s/p D&C 11/13, stopped bleeding then had 3 day period at beginning of this month. Is following with [facility] and planning IVF for next cycle. Struggling mentally with guilt about the miscarriage and being told she has "poor egg quality". Is going to an IOP 5 days a week to help with her grief & depression. Painful intercourse, worsening Urinary frequency with incomplete emptying and nocturia. Drinks 40oz water daily. Has leak with cough/laugh/sneeze and infrequent UUI.   The pregnancy intention screening data noted above was reviewed. Potential methods of contraception were discussed. The patient elected to proceed with No Contraception Precautions.   Last pap UTD per patient, will sign ROI to obtain record Last mammogram: Diagnostic mammo 2022. Results were: normal. Family h/o breast cancer: yes maternal grandmother Last colonoscopy: hx abnormal CSY, gets them q5y, last 2019     06/26/2022    8:04 AM 04/23/2022    2:30 PM 12/18/2021    2:46 PM 10/21/2021    8:25 AM  Depression screen PHQ 2/9  Decreased Interest 2 0 3 0  Down, Depressed, Hopeless 1 0 2 0  PHQ - 2 Score 3 0 5 0  Altered sleeping 2 0 2   Tired, decreased energy 1 0 3   Change in appetite 2 0 1   Feeling bad or failure about yourself  1 0 3   Trouble concentrating 2 0 3   Moving slowly or fidgety/restless 0 0 3   Suicidal thoughts 0 0 1   PHQ-9 Score 11 0 21   Difficult doing work/chores  Not difficult at all         No data to display         Review of Systems:   Pertinent items are noted in HPI Denies any headaches, blurred vision, fatigue, shortness of breath, chest  pain, abdominal pain, abnormal vaginal discharge/itching/odor/irritation, problems with periods, or bowel movements unless otherwise stated above. Pertinent History Reviewed:  Reviewed past medical,surgical, social and family history.  Reviewed problem list, medications and allergies. Physical Assessment:        Physical Examination:   General appearance - well appearing, and in no distress. Appropriately tearful  Mental status - alert, oriented to person, place, and time  Chest - respiratory effort normal  Heart - normal peripheral perfusion  Breasts - breasts appear normal, no suspicious masses, no skin or nipple changes or axillary nodes  Abdomen - soft, nontender, nondistended, no masses or organomegaly  Pelvic - NEFG. Mild bilateral levator tenderness. Remainder of exam deferred.  Chaperone present for exam  Assessment & Plan:  1) Well-Woman Exam Mammogram: @ 40yo, or sooner if problems Colonoscopy: per GI, or sooner if problems Pap: UTD per patient, will obtain records  2) Pelvic floor dysfunction Recent UA negative for LE/nitrite; no dysuria/urgency today Counseled on bladder diet/training and lifestyle modifications to help with bladder function.  PFPT ordered Discussed urogyn referral prn  3) Positive depression screen Currently in IOP, feels she is overall improving  4) History of miscarriage Will continue to follow with [facility] Rx for PNV and CoQ  Orders Placed This Encounter  Procedures  Ambulatory referral to Physical Therapy   Meds:  Meds ordered this encounter  Medications   Prenatal 28-0.8 MG TABS    Sig: Take 1 tablet by mouth daily.    Dispense:  90 tablet    Refill:  3   co-enzyme Q-10 30 MG capsule    Sig: Take 1 capsule (30 mg total) by mouth daily.    Dispense:  90 capsule    Refill:  3   Follow-up: Return if symptoms worsen or fail to improve.  Lennart Pall, MD 07/09/2022 11:47 AM

## 2022-07-09 NOTE — Progress Notes (Signed)
Wt 186lb Ht 62in  BP 120/82 P-86  per pt last pap 2022 @ [facility]. Pt did have a SAB last month and is very tearful

## 2022-07-10 DIAGNOSIS — F314 Bipolar disorder, current episode depressed, severe, without psychotic features: Secondary | ICD-10-CM | POA: Diagnosis not present

## 2022-07-11 DIAGNOSIS — F314 Bipolar disorder, current episode depressed, severe, without psychotic features: Secondary | ICD-10-CM | POA: Diagnosis not present

## 2022-07-15 DIAGNOSIS — F314 Bipolar disorder, current episode depressed, severe, without psychotic features: Secondary | ICD-10-CM | POA: Diagnosis not present

## 2022-07-16 DIAGNOSIS — Z31430 Encounter of female for testing for genetic disease carrier status for procreative management: Secondary | ICD-10-CM | POA: Diagnosis not present

## 2022-07-16 DIAGNOSIS — F314 Bipolar disorder, current episode depressed, severe, without psychotic features: Secondary | ICD-10-CM | POA: Diagnosis not present

## 2022-07-17 DIAGNOSIS — F314 Bipolar disorder, current episode depressed, severe, without psychotic features: Secondary | ICD-10-CM | POA: Diagnosis not present

## 2022-07-22 DIAGNOSIS — F314 Bipolar disorder, current episode depressed, severe, without psychotic features: Secondary | ICD-10-CM | POA: Diagnosis not present

## 2022-07-23 ENCOUNTER — Ambulatory Visit: Payer: BC Managed Care – PPO | Attending: Obstetrics and Gynecology | Admitting: Physical Therapy

## 2022-07-23 ENCOUNTER — Encounter: Payer: Self-pay | Admitting: Physical Therapy

## 2022-07-23 DIAGNOSIS — R1084 Generalized abdominal pain: Secondary | ICD-10-CM | POA: Diagnosis not present

## 2022-07-23 DIAGNOSIS — M62838 Other muscle spasm: Secondary | ICD-10-CM | POA: Insufficient documentation

## 2022-07-23 DIAGNOSIS — R102 Pelvic and perineal pain: Secondary | ICD-10-CM | POA: Diagnosis not present

## 2022-07-23 NOTE — Therapy (Addendum)
OUTPATIENT PHYSICAL THERAPY TREATMENT NOTE   Patient Name: Alexandra Escobar MRN: [id number] DOB:27-Mar-1981, 42 y.o., female Today's Date: 07/23/2022  PCP: Etta Grandchild, MD  REFERRING PROVIDER: Lennart Pall, MD    END OF SESSION:   PT End of Session - 07/23/22 1407     Visit Number 2    Date for PT Re-Evaluation 10/01/22    Authorization Type BCBS    PT Start Time 1400    PT Stop Time 1445    PT Time Calculation (min) 45 min    Activity Tolerance Patient tolerated treatment well    Behavior During Therapy [facility] for tasks assessed/performed             Past Medical History:  Diagnosis Date   Anxiety    Depression    Endometriosis    Fatty liver    Hypertension    PCOS (polycystic ovarian syndrome)    Past Surgical History:  Procedure Laterality Date   DILATION AND CURETTAGE OF UTERUS  06/2021   LAPAROSCOPY     for endometriosis   NASAL SEPTUM SURGERY     OTHER SURGICAL HISTORY     fibroid removal   OTHER SURGICAL HISTORY     fistula drainage   Patient Active Problem List   Diagnosis Date Noted   Acute atopic conjunctivitis of left eye 06/26/2022   Polyuria 06/26/2022   Hypertension    Severe episode of recurrent major depressive disorder, without psychotic features ([facility]) 12/18/2021   Acquired hypothyroidism 10/22/2021   Encounter for general adult medical examination with abnormal findings 10/21/2021   REFERRING DIAG: L93.790 (ICD-10-CM) - Levator spasm    THERAPY DIAG:  Other muscle spasm   Generalized abdominal pain   Pelvic pain   Rationale for Evaluation and Treatment: Rehabilitation   ONSET DATE: 06/20/2022   SUBJECTIVE:                                                                                                                                                                                            SUBJECTIVE STATEMENT: Patient had a 9 week miscarriage. She started feeling spasm in the lower pelvic area. Patient has to go to the rest  room more often. Wakes up 3-4 times per night. Intercourse is becoming pain. Clearance to have intercourse after 2 weeks. This morning the doctor checked her and she had pain.  Fluid intake: Yes: water     PAIN:  Are you having pain? Yes NPRS scale: 7/10 Pain location:  vaginal entrance and lower abdomen   Pain type: spasms Pain description: intermittent    Aggravating factors: penile penetration, sitting,  Relieving factors: heat   PRECAUTIONS: None   WEIGHT BEARING RESTRICTIONS: No   FALLS:  Has patient fallen in last 6 months? No   LIVING ENVIRONMENT: Lives with: lives with their family     OCCUPATION: sitting and works at a bank; getting back into walking   PLOF: Independent   PATIENT GOALS: reduce pain with sitting, decrease pressure, reduce muscle tension, reduce pain with intercourse   PERTINENT HISTORY:  PCOS; Laproscopy for endo;    BOWEL MOVEMENT: Pain with bowel movement: No     URINATION: Pain with urination: No Fully empty bladder: No needs to wait to get the remainder of the urine out Stream: Strong Urgency: Yes: becoming stronger Frequency: 2 hours during the day; nighttime void 3 hours Leakage: Urge to void, Walking to the bathroom, Coughing, Sneezing, and Laughing Pads: No   INTERCOURSE: Pain with intercourse: During Penetration Ability to have vaginal penetration:  Yes:   Climax: yes Marinoff Scale: 1/3   PREGNANCY: miscarriages: 2 in 1 year       OBJECTIVE:      COGNITION: Overall cognitive status: Within functional limits for tasks assessed                          SENSATION: Light touch: Appears intact Proprioception: Appears intact     LUMBAR SPECIAL TESTS:  SI Compression/distraction test: Positive and Gaenslen's test: Positive on the right     POSTURE: rounded shoulders, forward head, and decreased lumbar lordosis   PELVIC ALIGNMENT: right ilium is anteriorly rotated   LUMBARAROM/PROM:   A/PROM A/PROM  eval   Flexion Full with tightness in the lumbar region  Extension Full   Right lateral flexion Decreased by 25%  Left lateral flexion Decreased by 25%  Right rotation Decreased by 25%  Left rotation Decreased by 25%   (Blank rows = not tested)   LOWER EXTREMITY ROM: Bilateral hip ROM is full     LOWER EXTREMITY MMT:   MMT Right eval Left eval  Hip abduction 4/5 4/5  Hip adduction 4/5 4/5    PALPATION:   General  when contracts the lower abdomen she has a cramp in the right lower quadrant; tenderness located along the lower abdominals, along the pubic bone, inner thighs                 External Perineal Exam tenderness located on the ischiocavernosus, bulbocavernosus, perineal body; Q-tip test with  pain at 111,12,1 O'clock; The vulvar area is red                             Internal Pelvic Floor tenderness located on bilateral levator ani, obturator internist, along the urethra, sides of the bladder   Patient confirms identification and approves PT to assess internal pelvic floor and treatment Yes   PELVIC MMT:   MMT eval  Vaginal 4/5 with pain on right side of pelvic floor            TONE: increased     TODAY'S TREATMENT:  07/23/2022 Manual: Soft tissue mobilization: Manual work to the lumbar paraspinals, along the SI joint and gluteal Quadruped rocking back and forth and diagonals while therapist placed pressure on the anococcygeal ligament to release and reduce coccyx pain Myofascial release: Using the suction cup to the lumbar area Spinal mobilization: PA and rotational mobilization to L1-L5 grade 3 Muscle energy technique to right SI to  correct anteriorly rotated ilium Trigger Point Dry-Needling  Treatment instructions: Expect mild to moderate muscle soreness. S/S of pneumothorax if dry needled over a lung field, and to seek immediate medical attention should they occur. Patient verbalized understanding of these instructions and education.  Patient Consent Given:  Yes Education handout provided: Yes Muscles treated: lumbar multifidi; quadratus Electrical stimulation performed: No Parameters: N/A Treatment response/outcome: trigger point response and elongation of muscle Exercises: Stretches/mobility: Cat cow Childs pose Strengthening: Nustep level 3 for 5 minutes while assessing patient   PATIENT EDUCATION: 07/23/2022 Education details: Access Code: [email or web address] Person educated: Patient Education method: Explanation, Demonstration, Tactile cues, Verbal cues, and Handouts Education comprehension: verbalized understanding, returned demonstration, verbal cues required, tactile cues required, and needs further education      HOME EXERCISE PROGRAM: 1/3/024 Access Code: [email or web address] URL: [email or web address] Date: 07/23/2022 Prepared by: Earlie Counts  Exercises - Diaphragmatic Breathing with Hips Elevated (for Pelvic Organ Prolapse)  - 1 x daily - 7 x weekly - 3 sets - 10 reps - Cat Cow  - 1 x daily - 7 x weekly - 1 sets - 10 reps - Child's Pose Stretch  - 1 x daily - 7 x weekly - 1 sets - 2 reps - 30 sec hold  Patient Education - Trigger Point Dry Needling   ASSESSMENT:   CLINICAL IMPRESSION: Patient is a 42 y.o. female who was seen today for physical therapy treatment for levator spasm.  Patient had increased mobility of the lumbar spine and coccyx after manual work. Patient had reduce pain after therapy and felt like she could move better. Patient responded well to the dry needling. Patient had tingling in the lower abdomen after dry needling due to the release of the tissue in the Thoracolumbar area. Her ilium was in correct alignment after manual work. Patient will benefit from skilled therapy to work on reduction of trigger points in the pelvic floor, abdomen to reduce her pain and improve her strength.    OBJECTIVE IMPAIRMENTS: decreased activity tolerance, decreased coordination, decreased endurance, decreased ROM, decreased  strength, increased fascial restrictions, increased muscle spasms, impaired tone, and pain.    ACTIVITY LIMITATIONS: sitting, sleeping, and continence   PARTICIPATION LIMITATIONS: interpersonal relationship, driving, shopping, community activity, and occupation   PERSONAL FACTORS: Past/current experiences, Time since onset of injury/illness/exacerbation, and 3+ comorbidities: PCOS; Laproscopy for endo;   are also affecting patient's functional outcome.    REHAB POTENTIAL: Excellent   CLINICAL DECISION MAKING: Evolving/moderate complexity   EVALUATION COMPLEXITY: Moderate     GOALS: Goals reviewed with patient? Yes   SHORT TERM GOALS: Target date: 08/06/2022   Patient independent with pain management using diaphragmatic breathing, meditation and perineal massage.  Baseline: Goal status: INITIAL   2.  Patient has a negative Q-tip test due to improve tissue mobility and reduction of trigger points.  Baseline:  Goal status: INITIAL   3.  Patient is able to contract her abdominal with muscle spasm in the right lower quadrant.  Baseline:  Goal status: INITIAL   LONG TERM GOALS: Target date: 10/01/2022   Patient independent with advanced HEP for pelvic floor, core and hip strength to reduce leakage.  Baseline:  Goal status: INITIAL   2.  Patient is able to have penile penetration vaginally with 0-1/10 pain decreased in pelvic floor trigger points.  Baseline:  Goal status: INITIAL   3.  Patient is able to sit for 45 minutes to an our with pain level 0-1/10 due to elongation of her  pelvic floor muscles.  Baseline:  Goal status: INITIAL   4.  Patient is able to resume her workouts with core engagement with pain level 0-1/10 due to reduction of trigger points.  Baseline:  Goal status: INITIAL   5.  Patient reports her urinary leakage is less than </= 75% due to improved contraction of muscles without spasms.  Baseline:  Goal status: INITIAL     PLAN:   PT FREQUENCY:  2x/week   PT DURATION: 12 weeks   PLANNED INTERVENTIONS: Therapeutic exercises, Therapeutic activity, Neuromuscular re-education, Patient/Family education, Joint mobilization, Dry Needling, Electrical stimulation, Cryotherapy, Moist heat, Taping, Ultrasound, Biofeedback, and Manual therapy   PLAN FOR NEXT SESSION: see how she did with the dry needling. manual work to the lower abdomen and back to improve tissue mobility, improve low rib cage mobility, diaphragmatic breathing, hip stretches; engagement of the lower abdomen, hip pullback in sidely for the SI joint   Eulis Foster, PT 07/23/22 4:51 PM

## 2022-07-23 NOTE — Addendum Note (Signed)
Addended by: Earlie Counts F on: 07/23/2022 04:57 PM   Modules accepted: Orders

## 2022-07-24 ENCOUNTER — Ambulatory Visit: Payer: BC Managed Care – PPO | Admitting: Internal Medicine

## 2022-07-24 ENCOUNTER — Encounter: Payer: Self-pay | Admitting: Physical Therapy

## 2022-07-24 DIAGNOSIS — F314 Bipolar disorder, current episode depressed, severe, without psychotic features: Secondary | ICD-10-CM | POA: Diagnosis not present

## 2022-07-25 DIAGNOSIS — F314 Bipolar disorder, current episode depressed, severe, without psychotic features: Secondary | ICD-10-CM | POA: Diagnosis not present

## 2022-07-28 ENCOUNTER — Encounter: Payer: Self-pay | Admitting: Physical Therapy

## 2022-07-28 ENCOUNTER — Ambulatory Visit: Payer: BC Managed Care – PPO | Admitting: Physical Therapy

## 2022-07-28 DIAGNOSIS — R45851 Suicidal ideations: Secondary | ICD-10-CM | POA: Diagnosis not present

## 2022-07-28 DIAGNOSIS — F314 Bipolar disorder, current episode depressed, severe, without psychotic features: Secondary | ICD-10-CM | POA: Diagnosis not present

## 2022-07-28 DIAGNOSIS — Z20822 Contact with and (suspected) exposure to covid-19: Secondary | ICD-10-CM | POA: Diagnosis not present

## 2022-07-29 ENCOUNTER — Encounter: Payer: Self-pay | Admitting: Physical Therapy

## 2022-07-29 ENCOUNTER — Encounter: Payer: BC Managed Care – PPO | Attending: Obstetrics and Gynecology | Admitting: Physical Therapy

## 2022-07-29 DIAGNOSIS — F314 Bipolar disorder, current episode depressed, severe, without psychotic features: Secondary | ICD-10-CM | POA: Diagnosis not present

## 2022-07-29 DIAGNOSIS — R102 Pelvic and perineal pain: Secondary | ICD-10-CM

## 2022-07-29 DIAGNOSIS — R1084 Generalized abdominal pain: Secondary | ICD-10-CM

## 2022-07-29 DIAGNOSIS — M62838 Other muscle spasm: Secondary | ICD-10-CM | POA: Diagnosis not present

## 2022-07-29 NOTE — Therapy (Signed)
OUTPATIENT PHYSICAL THERAPY TREATMENT NOTE   Patient Name: Kasidy Gianino MRN: [id number] DOB:07-29-1980, 42 y.o., female Today's Date: 07/29/2022  PCP: Etta Grandchild, MD   REFERRING PROVIDER: Lennart Pall, MD   END OF SESSION:   PT End of Session - 07/29/22 1555     Visit Number 3    Date for PT Re-Evaluation 10/01/22    Authorization Type [employer]    PT Start Time 1556    PT Stop Time 1645    PT Time Calculation (min) 49 min    Activity Tolerance Patient tolerated treatment well    Behavior During Therapy WFL for tasks assessed/performed             Past Medical History:  Diagnosis Date   Anxiety    Depression    Endometriosis    Fatty liver    Hypertension    PCOS (polycystic ovarian syndrome)    Past Surgical History:  Procedure Laterality Date   DILATION AND CURETTAGE OF UTERUS  06/2021   LAPAROSCOPY     for endometriosis   NASAL SEPTUM SURGERY     OTHER SURGICAL HISTORY     fibroid removal   OTHER SURGICAL HISTORY     fistula drainage   Patient Active Problem List   Diagnosis Date Noted   Acute atopic conjunctivitis of left eye 06/26/2022   Polyuria 06/26/2022   Hypertension    Severe episode of recurrent major depressive disorder, without psychotic features (HCC) 12/18/2021   Acquired hypothyroidism 10/22/2021   Encounter for general adult medical examination with abnormal findings 10/21/2021   REFERRING DIAG: S34.196 (ICD-10-CM) - Levator spasm    THERAPY DIAG:  Other muscle spasm   Generalized abdominal pain   Pelvic pain   Rationale for Evaluation and Treatment: Rehabilitation   ONSET DATE: 06/20/2022   SUBJECTIVE:                                                                                                                                                                                            SUBJECTIVE STATEMENT: I felt better since the dry needling. I can tolerate the sitting more. I still feel the same amount of pressure.     PAIN:  Are you having pain? Yes NPRS scale: 4/10 Pain location:  vaginal entrance and lower abdomen   Pain type: spasms Pain description: intermittent    Aggravating factors: penile penetration, sitting,  Relieving factors: heat   PRECAUTIONS: None   WEIGHT BEARING RESTRICTIONS: No   FALLS:  Has patient fallen in last 6 months? No   LIVING ENVIRONMENT: Lives with: lives with their family  OCCUPATION: sitting and works at a bank; getting back into walking   PLOF: Independent   PATIENT GOALS: reduce pain with sitting, decrease pressure, reduce muscle tension, reduce pain with intercourse   PERTINENT HISTORY:  PCOS; Laproscopy for endo;    BOWEL MOVEMENT: Pain with bowel movement: No     URINATION: Pain with urination: No Fully empty bladder: No needs to wait to get the remainder of the urine out Stream: Strong Urgency: Yes: becoming stronger Frequency: 2 hours during the day; nighttime void 3 hours Leakage: Urge to void, Walking to the bathroom, Coughing, Sneezing, and Laughing Pads: No   INTERCOURSE: Pain with intercourse: During Penetration Ability to have vaginal penetration:  Yes:   Climax: yes Marinoff Scale: 1/3   PREGNANCY: miscarriages: 2 in 1 year       OBJECTIVE:      COGNITION: Overall cognitive status: Within functional limits for tasks assessed                          SENSATION: Light touch: Appears intact Proprioception: Appears intact     LUMBAR SPECIAL TESTS:  SI Compression/distraction test: Positive and Gaenslen's test: Positive on the right     POSTURE: rounded shoulders, forward head, and decreased lumbar lordosis   PELVIC ALIGNMENT: right ilium is anteriorly rotated   LUMBARAROM/PROM:   A/PROM A/PROM  eval  Flexion Full with tightness in the lumbar region  Extension Full   Right lateral flexion Decreased by 25%  Left lateral flexion Decreased by 25%  Right rotation Decreased by 25%  Left rotation Decreased by  25%   (Blank rows = not tested)   LOWER EXTREMITY ROM: Bilateral hip ROM is full     LOWER EXTREMITY MMT:   MMT Right eval Left eval  Hip abduction 4/5 4/5  Hip adduction 4/5 4/5    PALPATION:   General  when contracts the lower abdomen she has a cramp in the right lower quadrant; tenderness located along the lower abdominals, along the pubic bone, inner thighs                 External Perineal Exam tenderness located on the ischiocavernosus, bulbocavernosus, perineal body; Q-tip test with  pain at 111,12,1 O'clock; The vulvar area is red                             Internal Pelvic Floor tenderness located on bilateral levator ani, obturator internist, along the urethra, sides of the bladder   Patient confirms identification and approves PT to assess internal pelvic floor and treatment Yes   PELVIC MMT:   MMT eval  Vaginal 4/5 with pain on right side of pelvic floor            TONE: increased     TODAY'S TREATMENT:  07/29/2022 Manual: Soft tissue mobilization: To assess for dry needling Manual work to the right multifidi, quadratus and gluteal to elongate after dry needling Manual work to the medial side of the right ischial tuberosity in left sidely with right hip in ER Manual work to the right hip adductors and quads Myofascial release: Release around the right urogenital diaphragm, round ligament and levator ani Spinal mobilization: Gapping of the right L2-L5 in left sidely Internal pelvic floor techniques: No emotional/communication barriers or cognitive limitation. Patient is motivated to learn. Patient understands and agrees with treatment goals and plan. PT explains patient will be  examined in standing, sitting, and lying down to see how their muscles and joints work. When they are ready, they will be asked to remove their underwear so PT can examine their perineum. The patient is also given the option of providing their own chaperone as one is not provided in our  facility. The patient also has the right and is explained the right to defer or refuse any part of the evaluation or treatment including the internal exam. With the patient's consent, PT will use one gloved finger to gently assess the muscles of the pelvic floor, seeing how well it contracts and relaxes and if there is muscle symmetry. After, the patient will get dressed and PT and patient will discuss exam findings and plan of care. PT and patient discuss plan of care, schedule, attendance policy and HEP activities.  Manual work on the right external perineal area Trigger Point Dry-Needling  Treatment instructions: Expect mild to moderate muscle soreness. S/S of pneumothorax if dry needled over a lung field, and to seek immediate medical attention should they occur. Patient verbalized understanding of these instructions and education.  Patient Consent Given: Yes Education handout provided: Previously provided Muscles treated: right quadratus and lumbar multifidi, right gluteus medius, right hip adductor Electrical stimulation performed: No Parameters: N/A Treatment response/outcome: elongation of muscle and trigger point response  Exercises: Stretches/mobility: Quadruped rock with hips IR Pigeon pose bil. 30 sec Cat cow with therapist using the suction cup on the lumbar Stand with right foot on the step and reach to the right to gap the right SI joint  07/23/2022 Manual: Soft tissue mobilization: Manual work to the lumbar paraspinals, along the SI joint and gluteal Quadruped rocking back and forth and diagonals while therapist placed pressure on the anococcygeal ligament to release and reduce coccyx pain Myofascial release: Using the suction cup to the lumbar area Spinal mobilization: PA and rotational mobilization to L1-L5 grade 3 Muscle energy technique to right SI to correct anteriorly rotated ilium Trigger Point Dry-Needling  Treatment instructions: Expect mild to moderate muscle  soreness. S/S of pneumothorax if dry needled over a lung field, and to seek immediate medical attention should they occur. Patient verbalized understanding of these instructions and education.   Patient Consent Given: Yes Education handout provided: Yes Muscles treated: lumbar multifidi; quadratus Electrical stimulation performed: No Parameters: N/A Treatment response/outcome: trigger point response and elongation of muscle Exercises: Stretches/mobility: Cat cow Childs pose Strengthening: Nustep level 3 for 5 minutes while assessing patient    PATIENT EDUCATION: 07/23/2022 Education details: Access Code: [email or web address] Person educated: Patient Education method: Explanation, Demonstration, Tactile cues, Verbal cues, and Handouts Education comprehension: verbalized understanding, returned demonstration, verbal cues required, tactile cues required, and needs further education       HOME EXERCISE PROGRAM: 1/3/024 Access Code: [email or web address] URL: [email or web address] Date: 07/23/2022 Prepared by: Earlie Counts   Exercises - Diaphragmatic Breathing with Hips Elevated (for Pelvic Organ Prolapse)  - 1 x daily - 7 x weekly - 3 sets - 10 reps - Cat Cow  - 1 x daily - 7 x weekly - 1 sets - 10 reps - Child's Pose Stretch  - 1 x daily - 7 x weekly - 1 sets - 2 reps - 30 sec hold   Patient Education - Trigger Point Dry Needling   ASSESSMENT:   CLINICAL IMPRESSION: Patient is a 42 y.o. female who was seen today for physical therapy treatment for levator spasm.  She have been doing the calm app for meditation.  Back pain better since dry needling. Patient had many trigger points in the right hip adductor, gluteal, and quadratus. She had less pain after manual work. She is getting some opening of the right SI joint. Patient will benefit from skilled therapy to work on reduction of trigger points in the pelvic floor, abdomen to reduce her pain and improve her strength.    OBJECTIVE  IMPAIRMENTS: decreased activity tolerance, decreased coordination, decreased endurance, decreased ROM, decreased strength, increased fascial restrictions, increased muscle spasms, impaired tone, and pain.    ACTIVITY LIMITATIONS: sitting, sleeping, and continence   PARTICIPATION LIMITATIONS: interpersonal relationship, driving, shopping, community activity, and occupation   PERSONAL FACTORS: Past/current experiences, Time since onset of injury/illness/exacerbation, and 3+ comorbidities: PCOS; Laproscopy for endo;   are also affecting patient's functional outcome.    REHAB POTENTIAL: Excellent   CLINICAL DECISION MAKING: Evolving/moderate complexity   EVALUATION COMPLEXITY: Moderate     GOALS: Goals reviewed with patient? Yes   SHORT TERM GOALS: Target date: 08/06/2022   Patient independent with pain management using diaphragmatic breathing, meditation and perineal massage.  Baseline:doing meditation and diaphragmatic breathing Goal status: ongoing 07/29/22   2.  Patient has a negative Q-tip test due to improve tissue mobility and reduction of trigger points.  Baseline:  Goal status: INITIAL   3.  Patient is able to contract her abdominal with muscle spasm in the right lower quadrant.  Baseline:  Goal status: INITIAL   LONG TERM GOALS: Target date: 10/01/2022   Patient independent with advanced HEP for pelvic floor, core and hip strength to reduce leakage.  Baseline:  Goal status: INITIAL   2.  Patient is able to have penile penetration vaginally with 0-1/10 pain decreased in pelvic floor trigger points.  Baseline:  Goal status: INITIAL   3.  Patient is able to sit for 45 minutes to an our with pain level 0-1/10 due to elongation of her pelvic floor muscles.  Baseline:  Goal status: INITIAL   4.  Patient is able to resume her workouts with core engagement with pain level 0-1/10 due to reduction of trigger points.  Baseline:  Goal status: INITIAL   5.  Patient reports her  urinary leakage is less than </= 75% due to improved contraction of muscles without spasms.  Baseline:  Goal status: INITIAL     PLAN:   PT FREQUENCY: 2x/week   PT DURATION: 12 weeks   PLANNED INTERVENTIONS: Therapeutic exercises, Therapeutic activity, Neuromuscular re-education, Patient/Family education, Joint mobilization, Dry Needling, Electrical stimulation, Cryotherapy, Moist heat, Taping, Ultrasound, Biofeedback, and Manual therapy   PLAN FOR NEXT SESSION:  manual work to the lower abdomen and back to improve tissue mobility, add right perineal massage, hip stretches; engagement of the lower abdomen,   Eulis Foster, PT 07/29/22 4:46 PM

## 2022-07-30 DIAGNOSIS — Z113 Encounter for screening for infections with a predominantly sexual mode of transmission: Secondary | ICD-10-CM | POA: Diagnosis not present

## 2022-07-30 DIAGNOSIS — N96 Recurrent pregnancy loss: Secondary | ICD-10-CM | POA: Diagnosis not present

## 2022-07-30 DIAGNOSIS — Z3161 Procreative counseling and advice using natural family planning: Secondary | ICD-10-CM | POA: Diagnosis not present

## 2022-07-30 DIAGNOSIS — Z3141 Encounter for fertility testing: Secondary | ICD-10-CM | POA: Diagnosis not present

## 2022-07-30 DIAGNOSIS — N979 Female infertility, unspecified: Secondary | ICD-10-CM | POA: Diagnosis not present

## 2022-07-30 DIAGNOSIS — Z31430 Encounter of female for testing for genetic disease carrier status for procreative management: Secondary | ICD-10-CM | POA: Diagnosis not present

## 2022-07-30 DIAGNOSIS — N711 Chronic inflammatory disease of uterus: Secondary | ICD-10-CM | POA: Diagnosis not present

## 2022-08-04 DIAGNOSIS — F314 Bipolar disorder, current episode depressed, severe, without psychotic features: Secondary | ICD-10-CM | POA: Diagnosis not present

## 2022-08-05 ENCOUNTER — Encounter: Payer: BC Managed Care – PPO | Admitting: Physical Therapy

## 2022-08-05 ENCOUNTER — Encounter: Payer: Self-pay | Admitting: Physical Therapy

## 2022-08-05 DIAGNOSIS — R102 Pelvic and perineal pain: Secondary | ICD-10-CM

## 2022-08-05 DIAGNOSIS — R1084 Generalized abdominal pain: Secondary | ICD-10-CM

## 2022-08-05 DIAGNOSIS — M62838 Other muscle spasm: Secondary | ICD-10-CM

## 2022-08-05 DIAGNOSIS — F314 Bipolar disorder, current episode depressed, severe, without psychotic features: Secondary | ICD-10-CM | POA: Diagnosis not present

## 2022-08-05 NOTE — Therapy (Signed)
OUTPATIENT PHYSICAL THERAPY TREATMENT NOTE   Patient Name: Eartha Vonbehren MRN: [id number] DOB:06-09-1981, 42 y.o., female Today's Date: 08/05/2022  PCP:  Janith Lima, MD  REFERRING PROVIDER: Inez Catalina, MD    END OF SESSION:   PT End of Session - 08/05/22 1605     Visit Number 4    Date for PT Re-Evaluation 10/01/22    Authorization Type [employer]    PT Start Time 1600    PT Stop Time 1645    PT Time Calculation (min) 45 min    Activity Tolerance Patient tolerated treatment well    Behavior During Therapy WFL for tasks assessed/performed             Past Medical History:  Diagnosis Date   Anxiety    Depression    Endometriosis    Fatty liver    Hypertension    PCOS (polycystic ovarian syndrome)    Past Surgical History:  Procedure Laterality Date   DILATION AND CURETTAGE OF UTERUS  06/2021   LAPAROSCOPY     for endometriosis   NASAL SEPTUM SURGERY     OTHER SURGICAL HISTORY     fibroid removal   OTHER SURGICAL HISTORY     fistula drainage   Patient Active Problem List   Diagnosis Date Noted   Acute atopic conjunctivitis of left eye 06/26/2022   Polyuria 06/26/2022   Hypertension    Severe episode of recurrent major depressive disorder, without psychotic features ([place]) 12/18/2021   Acquired hypothyroidism 10/22/2021   Encounter for general adult medical examination with abnormal findings 10/21/2021   REFERRING DIAG: G99.242 (ICD-10-CM) - Levator spasm    THERAPY DIAG:  Other muscle spasm   Generalized abdominal pain   Pelvic pain   Rationale for Evaluation and Treatment: Rehabilitation   ONSET DATE: 06/20/2022   SUBJECTIVE:                                                                                                                                                                                            SUBJECTIVE STATEMENT: the day after treatment I had a biopsy of my uterus so was in discomfort. Waiting for the results still.     PAIN:  Are you having pain? Yes NPRS scale: 4/10 Pain location:  vaginal entrance and lower abdomen   Pain type: spasms Pain description: intermittent    Aggravating factors: penile penetration, sitting,  Relieving factors: heat   PRECAUTIONS: None   WEIGHT BEARING RESTRICTIONS: No   FALLS:  Has patient fallen in last 6 months? No   LIVING ENVIRONMENT: Lives with: lives with their family  OCCUPATION: sitting and works at a bank; getting back into walking   PLOF: Independent   PATIENT GOALS: reduce pain with sitting, decrease pressure, reduce muscle tension, reduce pain with intercourse   PERTINENT HISTORY:  PCOS; Laproscopy for endo;    BOWEL MOVEMENT: Pain with bowel movement: No     URINATION: Pain with urination: No Fully empty bladder: No needs to wait to get the remainder of the urine out Stream: Strong Urgency: Yes: becoming stronger Frequency: 2 hours during the day; nighttime void 3 hours Leakage: Urge to void, Walking to the bathroom, Coughing, Sneezing, and Laughing Pads: No   INTERCOURSE: Pain with intercourse: During Penetration Ability to have vaginal penetration:  Yes:   Climax: yes Marinoff Scale: 1/3   PREGNANCY: miscarriages: 2 in 1 year       OBJECTIVE:      COGNITION: Overall cognitive status: Within functional limits for tasks assessed                          SENSATION: Light touch: Appears intact Proprioception: Appears intact     LUMBAR SPECIAL TESTS:  SI Compression/distraction test: Positive and Gaenslen's test: Positive on the right     POSTURE: rounded shoulders, forward head, and decreased lumbar lordosis   PELVIC ALIGNMENT: right ilium is anteriorly rotated   LUMBARAROM/PROM:   A/PROM A/PROM  eval  Flexion Full with tightness in the lumbar region  Extension Full   Right lateral flexion Decreased by 25%  Left lateral flexion Decreased by 25%  Right rotation Decreased by 25%  Left rotation Decreased  by 25%   (Blank rows = not tested)   LOWER EXTREMITY ROM: Bilateral hip ROM is full     LOWER EXTREMITY MMT:   MMT Right eval Left eval  Hip abduction 4/5 4/5  Hip adduction 4/5 4/5    PALPATION:   General  when contracts the lower abdomen she has a cramp in the right lower quadrant; tenderness located along the lower abdominals, along the pubic bone, inner thighs                 External Perineal Exam tenderness located on the ischiocavernosus, bulbocavernosus, perineal body; Q-tip test with  pain at 111,12,1 O'clock; The vulvar area is red                             Internal Pelvic Floor tenderness located on bilateral levator ani, obturator internist, along the urethra, sides of the bladder   Patient confirms identification and approves PT to assess internal pelvic floor and treatment Yes   PELVIC MMT:   MMT eval  Vaginal 4/5 with pain on right side of pelvic floor            TONE: increased     TODAY'S TREATMENT:  08/05/22 Manual: Soft tissue mobilization: To the levator ani, hip adductor, gluteal, around the greater trochanter, ITB Neuromuscular re-education: Core facilitation: Supine hip flexion resistance to engage the lower abdominals holding for 5 sec 5 times each leg Supine transverse abdominus contraction holding 5 sec 5 Supine small movement of the arms for flexion and side to side holding a ball Quadruped contract the transverse abdominus holding 5 sec 5x Quadruped lift arm alternating 10x Quadruped lift one leg 10x each side Down training: Diaphragmatic breathing to relax the pelvic lfoor Exercises: Stretches/mobility: Hamstring bil. Holding 30 sec Happy baby 1 side hold for  30 sec  Double knee to chest holding 30 sec Childs pose holding for 30 sec Reverse clam 2 x 10 while pressing into the ball to engage the abdomen     07/29/2022 Manual: Soft tissue mobilization: To assess for dry needling Manual work to the right multifidi, quadratus and  gluteal to elongate after dry needling Manual work to the medial side of the right ischial tuberosity in left sidely with right hip in ER Manual work to the right hip adductors and quads Myofascial release: Release around the right urogenital diaphragm, round ligament and levator ani Spinal mobilization: Gapping of the right L2-L5 in left sidely Internal pelvic floor techniques: No emotional/communication barriers or cognitive limitation. Patient is motivated to learn. Patient understands and agrees with treatment goals and plan. PT explains patient will be examined in standing, sitting, and lying down to see how their muscles and joints work. When they are ready, they will be asked to remove their underwear so PT can examine their perineum. The patient is also given the option of providing their own chaperone as one is not provided in our facility. The patient also has the right and is explained the right to defer or refuse any part of the evaluation or treatment including the internal exam. With the patient's consent, PT will use one gloved finger to gently assess the muscles of the pelvic floor, seeing how well it contracts and relaxes and if there is muscle symmetry. After, the patient will get dressed and PT and patient will discuss exam findings and plan of care. PT and patient discuss plan of care, schedule, attendance policy and HEP activities.  Manual work on the right external perineal area Trigger Point Dry-Needling  Treatment instructions: Expect mild to moderate muscle soreness. S/S of pneumothorax if dry needled over a lung field, and to seek immediate medical attention should they occur. Patient verbalized understanding of these instructions and education.   Patient Consent Given: Yes Education handout provided: Previously provided Muscles treated: right quadratus and lumbar multifidi, right gluteus medius, right hip adductor Electrical stimulation performed: No Parameters:  N/A Treatment response/outcome: elongation of muscle and trigger point response   Exercises: Stretches/mobility: Quadruped rock with hips IR Pigeon pose bil. 30 sec Cat cow with therapist using the suction cup on the lumbar Stand with right foot on the step and reach to the right to gap the right SI joint   07/23/2022 Manual: Soft tissue mobilization: Manual work to the lumbar paraspinals, along the SI joint and gluteal Quadruped rocking back and forth and diagonals while therapist placed pressure on the anococcygeal ligament to release and reduce coccyx pain Myofascial release: Using the suction cup to the lumbar area Spinal mobilization: PA and rotational mobilization to L1-L5 grade 3 Muscle energy technique to right SI to correct anteriorly rotated ilium Trigger Point Dry-Needling  Treatment instructions: Expect mild to moderate muscle soreness. S/S of pneumothorax if dry needled over a lung field, and to seek immediate medical attention should they occur. Patient verbalized understanding of these instructions and education.   Patient Consent Given: Yes Education handout provided: Yes Muscles treated: lumbar multifidi; quadratus Electrical stimulation performed: No Parameters: N/A Treatment response/outcome: trigger point response and elongation of muscle Exercises: Stretches/mobility: Cat cow Childs pose Strengthening: Nustep level 3 for 5 minutes while assessing patient    PATIENT EDUCATION: 08/05/2022 Education details: Access Code: [email or web address] Person educated: Patient Education method: Explanation, Demonstration, Tactile cues, Verbal cues, and Handouts Education comprehension: verbalized understanding, returned demonstration, verbal cues  required, tactile cues required, and needs further education       HOME EXERCISE PROGRAM: 08/05/22 Access Code: [email or web address] URL: [email or web address] Date: 08/05/2022 Prepared by: Earlie Counts  Program Notes lay on back  with knees bent, hold a ball or roll of paper towel, move our arms up and down and side to side 20x with small movements  Exercises - Diaphragmatic Breathing with Hips Elevated (for Pelvic Organ Prolapse)  - 1 x daily - 7 x weekly - 3 sets - 10 reps - Cat Cow  - 1 x daily - 7 x weekly - 1 sets - 10 reps - Child's Pose Stretch  - 1 x daily - 7 x weekly - 1 sets - 2 reps - 30 sec hold - Hooklying Isometric Hip Flexion  - 1 x daily - 3 x weekly - 10 reps - Quadruped Transversus Abdominis Bracing  - 1 x daily - 3 x weekly - 1 sets - 5 reps - 5 sec hold - Quadruped Alternating Arm Lift  - 1 x daily - 3 x weekly - 1 sets - 10 reps - Quadruped Leg Lifts  - 1 x daily - 3 x weekly - 1 sets - 10 reps - Beginner Reverse Clamshell  - 1 x daily - 1 x weekly - 2 sets - 10 reps    ASSESSMENT:   CLINICAL IMPRESSION: Patient is a 42 y.o. female who was seen today for physical therapy treatment for levator spasm. Patient is not having pain today and no pain after exercise. She is able to engage her abdominals correctly. She has some difficulty with lifting her leg in quadruped and left is easier than right. She is getting some opening of the right SI joint.  Patient is not having coccyx pain anymore. Patient was not up for the therapist to do manual work on the perineum today. Patient will benefit from skilled therapy to work on reduction of trigger points in the pelvic floor, abdomen to reduce her pain and improve her strength.    OBJECTIVE IMPAIRMENTS: decreased activity tolerance, decreased coordination, decreased endurance, decreased ROM, decreased strength, increased fascial restrictions, increased muscle spasms, impaired tone, and pain.    ACTIVITY LIMITATIONS: sitting, sleeping, and continence   PARTICIPATION LIMITATIONS: interpersonal relationship, driving, shopping, community activity, and occupation   PERSONAL FACTORS: Past/current experiences, Time since onset of injury/illness/exacerbation, and 3+  comorbidities: PCOS; Laproscopy for endo;   are also affecting patient's functional outcome.    REHAB POTENTIAL: Excellent   CLINICAL DECISION MAKING: Evolving/moderate complexity   EVALUATION COMPLEXITY: Moderate     GOALS: Goals reviewed with patient? Yes   SHORT TERM GOALS: Target date: 08/06/2022   Patient independent with pain management using diaphragmatic breathing, meditation and perineal massage.  Baseline:doing meditation and diaphragmatic breathing Goal status: ongoing 07/29/22   2.  Patient has a negative Q-tip test due to improve tissue mobility and reduction of trigger points.  Baseline:  Goal status: INITIAL   3.  Patient is able to contract her abdominal with muscle spasm in the right lower quadrant.  Baseline:  Goal status: Met 08/05/2022   LONG TERM GOALS: Target date: 10/01/2022   Patient independent with advanced HEP for pelvic floor, core and hip strength to reduce leakage.  Baseline:  Goal status: INITIAL   2.  Patient is able to have penile penetration vaginally with 0-1/10 pain decreased in pelvic floor trigger points.  Baseline:  Goal status: INITIAL   3.  Patient is able to sit  for 45 minutes to an our with pain level 0-1/10 due to elongation of her pelvic floor muscles.  Baseline:  Goal status: INITIAL   4.  Patient is able to resume her workouts with core engagement with pain level 0-1/10 due to reduction of trigger points.  Baseline:  Goal status: INITIAL   5.  Patient reports her urinary leakage is less than </= 75% due to improved contraction of muscles without spasms.  Baseline:  Goal status: INITIAL     PLAN:   PT FREQUENCY: 2x/week   PT DURATION: 12 weeks   PLANNED INTERVENTIONS: Therapeutic exercises, Therapeutic activity, Neuromuscular re-education, Patient/Family education, Joint mobilization, Dry Needling, Electrical stimulation, Cryotherapy, Moist heat, Taping, Ultrasound, Biofeedback, and Manual therapy   PLAN FOR NEXT  SESSION:  manual work to the lower abdomen and back to improve tissue mobility, add right perineal massage, progress exercises, ask about urinary leakage    Eulis Foster, PT 08/05/22 4:49 PM

## 2022-08-06 DIAGNOSIS — F314 Bipolar disorder, current episode depressed, severe, without psychotic features: Secondary | ICD-10-CM | POA: Diagnosis not present

## 2022-08-06 DIAGNOSIS — F411 Generalized anxiety disorder: Secondary | ICD-10-CM | POA: Diagnosis not present

## 2022-08-06 DIAGNOSIS — F3132 Bipolar disorder, current episode depressed, moderate: Secondary | ICD-10-CM | POA: Diagnosis not present

## 2022-08-07 DIAGNOSIS — F314 Bipolar disorder, current episode depressed, severe, without psychotic features: Secondary | ICD-10-CM | POA: Diagnosis not present

## 2022-08-08 ENCOUNTER — Ambulatory Visit: Payer: BC Managed Care – PPO | Admitting: Physical Therapy

## 2022-08-11 ENCOUNTER — Encounter: Payer: Self-pay | Admitting: Obstetrics and Gynecology

## 2022-08-12 DIAGNOSIS — F314 Bipolar disorder, current episode depressed, severe, without psychotic features: Secondary | ICD-10-CM | POA: Diagnosis not present

## 2022-08-15 ENCOUNTER — Ambulatory Visit: Payer: BC Managed Care – PPO | Admitting: Physical Therapy

## 2022-08-15 ENCOUNTER — Encounter: Payer: Self-pay | Admitting: Physical Therapy

## 2022-08-15 DIAGNOSIS — M62838 Other muscle spasm: Secondary | ICD-10-CM

## 2022-08-15 DIAGNOSIS — R102 Pelvic and perineal pain: Secondary | ICD-10-CM

## 2022-08-15 DIAGNOSIS — R1084 Generalized abdominal pain: Secondary | ICD-10-CM

## 2022-08-15 NOTE — Therapy (Signed)
OUTPATIENT PHYSICAL THERAPY TREATMENT NOTE   Patient Name: Alexandra Escobar MRN: [id number] DOB:October 02, 1980, 42 y.o., female Today's Date: 08/15/2022  PCP: Etta Grandchild, MD  REFERRING PROVIDER: Lennart Pall, MD   END OF SESSION:   PT End of Session - 08/15/22 1109     Visit Number 5    Date for PT Re-Evaluation 10/01/22    Authorization Type [employer]    Authorization - Visit Number 4    Authorization - Number of Visits 30    PT Start Time 1100    PT Stop Time 1140    PT Time Calculation (min) 40 min    Activity Tolerance Patient tolerated treatment well    Behavior During Therapy WFL for tasks assessed/performed             Past Medical History:  Diagnosis Date   Anxiety    Depression    Endometriosis    Fatty liver    Hypertension    PCOS (polycystic ovarian syndrome)    Past Surgical History:  Procedure Laterality Date   DILATION AND CURETTAGE OF UTERUS  06/2021   LAPAROSCOPY     for endometriosis   NASAL SEPTUM SURGERY     OTHER SURGICAL HISTORY     fibroid removal   OTHER SURGICAL HISTORY     fistula drainage   Patient Active Problem List   Diagnosis Date Noted   Acute atopic conjunctivitis of left eye 06/26/2022   Polyuria 06/26/2022   Hypertension    Severe episode of recurrent major depressive disorder, without psychotic features (HCC) 12/18/2021   Acquired hypothyroidism 10/22/2021   Encounter for general adult medical examination with abnormal findings 10/21/2021   REFERRING DIAG: J49.702 (ICD-10-CM) - Levator spasm    THERAPY DIAG:  Other muscle spasm   Generalized abdominal pain   Pelvic pain   Rationale for Evaluation and Treatment: Rehabilitation   ONSET DATE: 06/20/2022   SUBJECTIVE:                                                                                                                                                                                            SUBJECTIVE STATEMENT: Biopsy showed an infection and now on  antibiotics. The antibiotics have made me sick.  I was doing my stretches and felt a pop. I have had back pain since then.      PAIN:  Are you having pain? Yes NPRS scale: 3/10 Pain location:  vaginal entrance and lower abdomen   Pain type: spasms Pain description: intermittent    Aggravating factors: penile penetration, sitting,  Relieving factors: heat  PAIN:  Are you  having pain? Yes: NPRS scale: 5/10 Pain location: low back Pain description: sharp, constant Aggravating factors: sitting Relieving factors: not sure     PRECAUTIONS: None   WEIGHT BEARING RESTRICTIONS: No   FALLS:  Has patient fallen in last 6 months? No   LIVING ENVIRONMENT: Lives with: lives with their family     OCCUPATION: sitting and works at a bank; getting back into walking   PLOF: Independent   PATIENT GOALS: reduce pain with sitting, decrease pressure, reduce muscle tension, reduce pain with intercourse   PERTINENT HISTORY:  PCOS; Laproscopy for endo;    BOWEL MOVEMENT: Pain with bowel movement: No     URINATION: Pain with urination: No Fully empty bladder: No needs to wait to get the remainder of the urine out Stream: Strong Urgency: Yes: becoming stronger Frequency: 2 hours during the day; nighttime void 3 hours Leakage: Urge to void, Walking to the bathroom, Coughing, Sneezing, and Laughing Pads: No   INTERCOURSE: Pain with intercourse: During Penetration Ability to have vaginal penetration:  Yes:   Climax: yes Marinoff Scale: 1/3   PREGNANCY: miscarriages: 2 in 1 year       OBJECTIVE:      COGNITION: Overall cognitive status: Within functional limits for tasks assessed                          SENSATION: Light touch: Appears intact Proprioception: Appears intact     LUMBAR SPECIAL TESTS:  SI Compression/distraction test: Positive and Gaenslen's test: Positive on the right     POSTURE: rounded shoulders, forward head, and decreased lumbar lordosis   PELVIC  ALIGNMENT: right ilium is anteriorly rotated   LUMBARAROM/PROM:   A/PROM A/PROM  eval 08/15/22  Flexion Full with tightness in the lumbar region full  Extension Full  full  Right lateral flexion Decreased by 25% full  Left lateral flexion Decreased by 25% full  Right rotation Decreased by 25% Decreased by 25%  Left rotation Decreased by 25% full   (Blank rows = not tested)   LOWER EXTREMITY ROM: Bilateral hip ROM is full     LOWER EXTREMITY MMT:   MMT Right eval Left eval  Hip abduction 4/5 4/5  Hip adduction 4/5 4/5    PALPATION:   General  when contracts the lower abdomen she has a cramp in the right lower quadrant; tenderness located along the lower abdominals, along the pubic bone, inner thighs                 External Perineal Exam tenderness located on the ischiocavernosus, bulbocavernosus, perineal body; Q-tip test with  pain at 111,12,1 O'clock; The vulvar area is red                             Internal Pelvic Floor tenderness located on bilateral levator ani, obturator internist, along the urethra, sides of the bladder   Patient confirms identification and approves PT to assess internal pelvic floor and treatment Yes   PELVIC MMT:   MMT eval  Vaginal 4/5 with pain on right side of pelvic floor            TONE: increased     TODAY'S TREATMENT:  08/15/22 Manual: Soft tissue mobilization: To assess for dry needling Using the addaday to the left gluteus, left quadratus, left lumbar paraspinals to elongate after dry needling Spinal manipulation:  Mobilization of L1-L5 in right sidley  with gapping of the joints Trigger Point Dry-Needling  Treatment instructions: Expect mild to moderate muscle soreness. S/S of pneumothorax if dry needled over a lung field, and to seek immediate medical attention should they occur. Patient verbalized understanding of these instructions and education.  Patient Consent Given: Yes Education handout provided: Previously  provided Muscles treated: left quadratus, left lumbar multifidi Electrical stimulation performed: No Parameters: N/A Treatment response/outcome: elongation of muscle and trigger point response  Exercises: Strengthening: Recumbent bike 6 minutes while assessing patient     08/05/22 Manual: Soft tissue mobilization: To the levator ani, hip adductor, gluteal, around the greater trochanter, ITB Neuromuscular re-education: Core facilitation: Supine hip flexion resistance to engage the lower abdominals holding for 5 sec 5 times each leg Supine transverse abdominus contraction holding 5 sec 5 Supine small movement of the arms for flexion and side to side holding a ball Quadruped contract the transverse abdominus holding 5 sec 5x Quadruped lift arm alternating 10x Quadruped lift one leg 10x each side Down training: Diaphragmatic breathing to relax the pelvic lfoor Exercises: Stretches/mobility: Hamstring bil. Holding 30 sec Happy baby 1 side hold for 30 sec  Double knee to chest holding 30 sec Childs pose holding for 30 sec Reverse clam 2 x 10 while pressing into the ball to engage the abdomen       07/29/2022 Manual: Soft tissue mobilization: To assess for dry needling Manual work to the right multifidi, quadratus and gluteal to elongate after dry needling Manual work to the medial side of the right ischial tuberosity in left sidely with right hip in ER Manual work to the right hip adductors and quads Myofascial release: Release around the right urogenital diaphragm, round ligament and levator ani Spinal mobilization: Gapping of the right L2-L5 in left sidely Internal pelvic floor techniques: No emotional/communication barriers or cognitive limitation. Patient is motivated to learn. Patient understands and agrees with treatment goals and plan. PT explains patient will be examined in standing, sitting, and lying down to see how their muscles and joints work. When they are ready,  they will be asked to remove their underwear so PT can examine their perineum. The patient is also given the option of providing their own chaperone as one is not provided in our facility. The patient also has the right and is explained the right to defer or refuse any part of the evaluation or treatment including the internal exam. With the patient's consent, PT will use one gloved finger to gently assess the muscles of the pelvic floor, seeing how well it contracts and relaxes and if there is muscle symmetry. After, the patient will get dressed and PT and patient will discuss exam findings and plan of care. PT and patient discuss plan of care, schedule, attendance policy and HEP activities.  Manual work on the right external perineal area Trigger Point Dry-Needling  Treatment instructions: Expect mild to moderate muscle soreness. S/S of pneumothorax if dry needled over a lung field, and to seek immediate medical attention should they occur. Patient verbalized understanding of these instructions and education.   Patient Consent Given: Yes Education handout provided: Previously provided Muscles treated: right quadratus and lumbar multifidi, right gluteus medius, right hip adductor Electrical stimulation performed: No Parameters: N/A Treatment response/outcome: elongation of muscle and trigger point response   Exercises: Stretches/mobility: Quadruped rock with hips IR Pigeon pose bil. 30 sec Cat cow with therapist using the suction cup on the lumbar Stand with right foot on the step and reach to  the right to gap the right SI joint     PATIENT EDUCATION: 08/05/2022 Education details: Access Code: [email or web address] Person educated: Patient Education method: Explanation, Demonstration, Tactile cues, Verbal cues, and Handouts Education comprehension: verbalized understanding, returned demonstration, verbal cues required, tactile cues required, and needs further education       HOME EXERCISE  PROGRAM: 08/05/22 Access Code: [email or web address] URL: [email or web address] Date: 08/05/2022 Prepared by: Earlie Counts   Program Notes lay on back with knees bent, hold a ball or roll of paper towel, move our arms up and down and side to side 20x with small movements   Exercises - Diaphragmatic Breathing with Hips Elevated (for Pelvic Organ Prolapse)  - 1 x daily - 7 x weekly - 3 sets - 10 reps - Cat Cow  - 1 x daily - 7 x weekly - 1 sets - 10 reps - Child's Pose Stretch  - 1 x daily - 7 x weekly - 1 sets - 2 reps - 30 sec hold - Hooklying Isometric Hip Flexion  - 1 x daily - 3 x weekly - 10 reps - Quadruped Transversus Abdominis Bracing  - 1 x daily - 3 x weekly - 1 sets - 5 reps - 5 sec hold - Quadruped Alternating Arm Lift  - 1 x daily - 3 x weekly - 1 sets - 10 reps - Quadruped Leg Lifts  - 1 x daily - 3 x weekly - 1 sets - 10 reps - Beginner Reverse Clamshell  - 1 x daily - 1 x weekly - 2 sets - 10 reps     ASSESSMENT:   CLINICAL IMPRESSION: Patient is a 42 y.o. female who was seen today for physical therapy treatment for levator spasm. Patient is not feeling well today due to the antibiotics so did not stress exercise. Patient abdominal pain is getting better since she is taking the antibiotics. She was stretching and started to have back pain from it. She had decreased movement of left lumbar facets and spasm of quadratus and gluteus medius. Patient pain decreased after the manual work and she was able to move with greater ease. Patient will benefit from skilled therapy to work on reduction of trigger points in the pelvic floor, abdomen to reduce her pain and improve her strength.    OBJECTIVE IMPAIRMENTS: decreased activity tolerance, decreased coordination, decreased endurance, decreased ROM, decreased strength, increased fascial restrictions, increased muscle spasms, impaired tone, and pain.    ACTIVITY LIMITATIONS: sitting, sleeping, and continence   PARTICIPATION  LIMITATIONS: interpersonal relationship, driving, shopping, community activity, and occupation   PERSONAL FACTORS: Past/current experiences, Time since onset of injury/illness/exacerbation, and 3+ comorbidities: PCOS; Laproscopy for endo;   are also affecting patient's functional outcome.    REHAB POTENTIAL: Excellent   CLINICAL DECISION MAKING: Evolving/moderate complexity   EVALUATION COMPLEXITY: Moderate     GOALS: Goals reviewed with patient? Yes   SHORT TERM GOALS: Target date: 08/06/2022   Patient independent with pain management using diaphragmatic breathing, meditation and perineal massage.  Baseline:doing meditation and diaphragmatic breathing Goal status: ongoing 07/29/22   2.  Patient has a negative Q-tip test due to improve tissue mobility and reduction of trigger points.  Baseline:  Goal status: INITIAL   3.  Patient is able to contract her abdominal with muscle spasm in the right lower quadrant.  Baseline:  Goal status: Met 08/05/2022   LONG TERM GOALS: Target date: 10/01/2022   Patient independent with advanced HEP for pelvic floor, core and hip strength  to reduce leakage.  Baseline:  Goal status: INITIAL   2.  Patient is able to have penile penetration vaginally with 0-1/10 pain decreased in pelvic floor trigger points.  Baseline:  Goal status: INITIAL   3.  Patient is able to sit for 45 minutes to an our with pain level 0-1/10 due to elongation of her pelvic floor muscles.  Baseline:  Goal status: INITIAL   4.  Patient is able to resume her workouts with core engagement with pain level 0-1/10 due to reduction of trigger points.  Baseline:  Goal status: INITIAL   5.  Patient reports her urinary leakage is less than </= 75% due to improved contraction of muscles without spasms.  Baseline:  Goal status: INITIAL     PLAN:   PT FREQUENCY: 2x/week   PT DURATION: 12 weeks   PLANNED INTERVENTIONS: Therapeutic exercises, Therapeutic activity, Neuromuscular  re-education, Patient/Family education, Joint mobilization, Dry Needling, Electrical stimulation, Cryotherapy, Moist heat, Taping, Ultrasound, Biofeedback, and Manual therapy   PLAN FOR NEXT SESSION:  manual work to the lower abdomen and back to improve tissue mobility, add right perineal massage, progress exercises, ask about urinary leakage   Eulis Foster, PT 08/15/22 11:53 AM

## 2022-08-20 ENCOUNTER — Ambulatory Visit: Payer: BC Managed Care – PPO | Admitting: Physical Therapy

## 2022-08-20 ENCOUNTER — Encounter: Payer: Self-pay | Admitting: Physical Therapy

## 2022-08-20 DIAGNOSIS — M62838 Other muscle spasm: Secondary | ICD-10-CM

## 2022-08-20 DIAGNOSIS — R1084 Generalized abdominal pain: Secondary | ICD-10-CM | POA: Diagnosis not present

## 2022-08-20 DIAGNOSIS — R102 Pelvic and perineal pain: Secondary | ICD-10-CM

## 2022-08-20 NOTE — Therapy (Addendum)
OUTPATIENT PHYSICAL THERAPY TREATMENT NOTE   Patient Name: Alexandra Escobar MRN: [id number] DOB:1981-02-13, 42 y.o., female Today's Date: 08/20/2022  PCP: Janith Lima, MD   REFERRING PROVIDER: Inez Catalina, MD    END OF SESSION:   PT End of Session - 08/20/22 1619     Visit Number 6    Date for PT Re-Evaluation 10/01/22    Authorization Type BCBS    PT Start Time 1615    PT Stop Time 1655    PT Time Calculation (min) 40 min    Activity Tolerance Patient tolerated treatment well    Behavior During Therapy [facility] for tasks assessed/performed             Past Medical History:  Diagnosis Date   Anxiety    Depression    Endometriosis    Fatty liver    Hypertension    PCOS (polycystic ovarian syndrome)    Past Surgical History:  Procedure Laterality Date   DILATION AND CURETTAGE OF UTERUS  06/2021   LAPAROSCOPY     for endometriosis   NASAL SEPTUM SURGERY     OTHER SURGICAL HISTORY     fibroid removal   OTHER SURGICAL HISTORY     fistula drainage   Patient Active Problem List   Diagnosis Date Noted   Acute atopic conjunctivitis of left eye 06/26/2022   Polyuria 06/26/2022   Hypertension    Severe episode of recurrent major depressive disorder, without psychotic features ([place]) 12/18/2021   Acquired hypothyroidism 10/22/2021   Encounter for general adult medical examination with abnormal findings 10/21/2021   REFERRING DIAG: [id number] (ICD-10-CM) - Levator spasm    THERAPY DIAG:  Other muscle spasm   Generalized abdominal pain   Pelvic pain   Rationale for Evaluation and Treatment: Rehabilitation   ONSET DATE: 06/20/2022   SUBJECTIVE:                                                                                                                                                                                            SUBJECTIVE STATEMENT: I have had pain in the right pubic bone. No pain in the right lower quadrant or vaginal entrance.      PAIN:   Are you having pain? Yes NPRS scale: 3/10 Pain location:  right suprapubic bone   Pain type: soreness Pain description: intermittent    Aggravating factors:  sitting, pressure on the area Relieving factors: not touching the area   PAIN:  Are you having pain? Yes: NPRS scale:3/10 Pain location: low back left Pain description: intermittent Aggravating factors: sitting Relieving factors: not sure  PRECAUTIONS: None   WEIGHT BEARING RESTRICTIONS: No   FALLS:  Has patient fallen in last 6 months? No   LIVING ENVIRONMENT: Lives with: lives with their family     OCCUPATION: sitting and works at a bank; getting back into walking   PLOF: Independent   PATIENT GOALS: reduce pain with sitting, decrease pressure, reduce muscle tension, reduce pain with intercourse   PERTINENT HISTORY:  PCOS; Laproscopy for endo;    BOWEL MOVEMENT: Pain with bowel movement: No     URINATION: Pain with urination: No Fully empty bladder: No needs to wait to get the remainder of the urine out Stream: Strong Urgency: Yes: becoming stronger Frequency: 2 hours during the day; nighttime void 3 hours Leakage: Urge to void, Walking to the bathroom, Coughing, Sneezing, and Laughing Pads: No   INTERCOURSE: Pain with intercourse: During Penetration Ability to have vaginal penetration:  Yes:   Climax: yes Marinoff Scale: 1/3   PREGNANCY: miscarriages: 2 in 1 year       OBJECTIVE:      COGNITION: Overall cognitive status: Within functional limits for tasks assessed                          SENSATION: Light touch: Appears intact Proprioception: Appears intact     LUMBAR SPECIAL TESTS:  SI Compression/distraction test: Positive and Gaenslen's test: Positive on the right     POSTURE: rounded shoulders, forward head, and decreased lumbar lordosis   PELVIC ALIGNMENT: right ilium is anteriorly rotated   LUMBARAROM/PROM:   A/PROM A/PROM  eval 08/15/22  Flexion Full with  tightness in the lumbar region full  Extension Full  full  Right lateral flexion Decreased by 25% full  Left lateral flexion Decreased by 25% full  Right rotation Decreased by 25% Decreased by 25%  Left rotation Decreased by 25% full   (Blank rows = not tested)   LOWER EXTREMITY ROM: Bilateral hip ROM is full     LOWER EXTREMITY MMT:   MMT Right eval Left eval  Hip abduction 4/5 4/5  Hip adduction 4/5 4/5    PALPATION:   General  right ilium is anteriorly rotated                 External Perineal Exam tenderness located on the ischiocavernosus, bulbocavernosus, perineal body; Q-tip test with  pain at 111,12,1 O'clock; The vulvar area is red                             Internal Pelvic Floor tenderness located on bilateral levator ani, obturator internist, along the urethra, sides of the bladder   Patient confirms identification and approves PT to assess internal pelvic floor and treatment Yes   PELVIC MMT:   MMT eval  Vaginal 4/5 with pain on right side of pelvic floor            TONE: increased     TODAY'S TREATMENT:  08/20/22 Manual: Myofascial release: Fascial work on the right round ligament, along the right mons pubis and ischiocavernosus Spinal mobilization: Posterior glide of right SI joint in supine PA and rotational mobilization to L1-L5 Mobilization to right SI joint Exercises: Stretches/mobility: Quadruped with right knee on yoga block and go side to side to stretch right SI joint Strengthening: Nu step for 6 minutes at level 5 while assessing patient Quadruped with left knee on yoga block and lift the right leg  Stand with one foot ahead and pull 5# back with pulleys 15x on each    08/15/22 Manual: Soft tissue mobilization: To assess for dry needling Using the addaday to the left gluteus, left quadratus, left lumbar paraspinals to elongate after dry needling Spinal manipulation:  Mobilization of L1-L5 in right sidley with gapping of the  joints Trigger Point Dry-Needling  Treatment instructions: Expect mild to moderate muscle soreness. S/S of pneumothorax if dry needled over a lung field, and to seek immediate medical attention should they occur. Patient verbalized understanding of these instructions and education.   Patient Consent Given: Yes Education handout provided: Previously provided Muscles treated: left quadratus, left lumbar multifidi Electrical stimulation performed: No Parameters: N/A Treatment response/outcome: elongation of muscle and trigger point response   Exercises: Strengthening: Recumbent bike 6 minutes while assessing patient     08/05/22 Manual: Soft tissue mobilization: To the levator ani, hip adductor, gluteal, around the greater trochanter, ITB Neuromuscular re-education: Core facilitation: Supine hip flexion resistance to engage the lower abdominals holding for 5 sec 5 times each leg Supine transverse abdominus contraction holding 5 sec 5 Supine small movement of the arms for flexion and side to side holding a ball Quadruped contract the transverse abdominus holding 5 sec 5x Quadruped lift arm alternating 10x Quadruped lift one leg 10x each side Down training: Diaphragmatic breathing to relax the pelvic lfoor Exercises: Stretches/mobility: Hamstring bil. Holding 30 sec Happy baby 1 side hold for 30 sec  Double knee to chest holding 30 sec Childs pose holding for 30 sec Reverse clam 2 x 10 while pressing into the ball to engage the abdomen       PATIENT EDUCATION: 08/05/2022 Education details: Access Code: [email or web address] Person educated: Patient Education method: Explanation, Demonstration, Tactile cues, Verbal cues, and Handouts Education comprehension: verbalized understanding, returned demonstration, verbal cues required, tactile cues required, and needs further education       HOME EXERCISE PROGRAM: 08/05/22 Access Code: [email or web address] URL: [email or web address] Date:  08/05/2022 Prepared by: Earlie Counts   Program Notes lay on back with knees bent, hold a ball or roll of paper towel, move our arms up and down and side to side 20x with small movements   Exercises - Diaphragmatic Breathing with Hips Elevated (for Pelvic Organ Prolapse)  - 1 x daily - 7 x weekly - 3 sets - 10 reps - Cat Cow  - 1 x daily - 7 x weekly - 1 sets - 10 reps - Child's Pose Stretch  - 1 x daily - 7 x weekly - 1 sets - 2 reps - 30 sec hold - Hooklying Isometric Hip Flexion  - 1 x daily - 3 x weekly - 10 reps - Quadruped Transversus Abdominis Bracing  - 1 x daily - 3 x weekly - 1 sets - 5 reps - 5 sec hold - Quadruped Alternating Arm Lift  - 1 x daily - 3 x weekly - 1 sets - 10 reps - Quadruped Leg Lifts  - 1 x daily - 3 x weekly - 1 sets - 10 reps - Beginner Reverse Clamshell  - 1 x daily - 1 x weekly - 2 sets - 10 reps     ASSESSMENT:   CLINICAL IMPRESSION: Patient is a 42 y.o. female who was seen today for physical therapy treatment for levator spasm. Patient lumbar pain is not only on thel left at level 3/10. No vaginal pain or right lower quadrant. She is not bloated anymore. Patient is not having pain  with penile penetration. Patient pelvis was in correct alignment after manual work.  Patient will benefit from skilled therapy to work on reduction of trigger points in the pelvic floor, abdomen to reduce her pain and improve her strength.    OBJECTIVE IMPAIRMENTS: decreased activity tolerance, decreased coordination, decreased endurance, decreased ROM, decreased strength, increased fascial restrictions, increased muscle spasms, impaired tone, and pain.    ACTIVITY LIMITATIONS: sitting, sleeping, and continence   PARTICIPATION LIMITATIONS: interpersonal relationship, driving, shopping, community activity, and occupation   PERSONAL FACTORS: Past/current experiences, Time since onset of injury/illness/exacerbation, and 3+ comorbidities: PCOS; Laproscopy for endo;   are also  affecting patient's functional outcome.    REHAB POTENTIAL: Excellent   CLINICAL DECISION MAKING: Evolving/moderate complexity   EVALUATION COMPLEXITY: Moderate     GOALS: Goals reviewed with patient? Yes   SHORT TERM GOALS: Target date: 08/06/2022   Patient independent with pain management using diaphragmatic breathing, meditation and perineal massage.  Baseline:doing meditation and diaphragmatic breathing Goal status: Met 08/20/22   2.  Patient has a negative Q-tip test due to improve tissue mobility and reduction of trigger points.  Baseline:  Goal status: INITIAL   3.  Patient is able to contract her abdominal with muscle spasm in the right lower quadrant.  Baseline:  Goal status: Met 08/05/2022   LONG TERM GOALS: Target date: 10/01/2022   Patient independent with advanced HEP for pelvic floor, core and hip strength to reduce leakage.  Baseline:  Goal status: INITIAL   2.  Patient is able to have penile penetration vaginally with 0-1/10 pain decreased in pelvic floor trigger points.  Baseline:  Goal status: Met 08/20/22   3.  Patient is able to sit for 45 minutes to an our with pain level 0-1/10 due to elongation of her pelvic floor muscles.  Baseline:  Goal status: INITIAL   4.  Patient is able to resume her workouts with core engagement with pain level 0-1/10 due to reduction of trigger points.  Baseline: starting to exercise Goal status: INITIAL   5.  Patient reports her urinary leakage is less than </= 75% due to improved contraction of muscles without spasms.  Baseline:  Goal status: INITIAL     PLAN:   PT FREQUENCY: 2x/week   PT DURATION: 12 weeks   PLANNED INTERVENTIONS: Therapeutic exercises, Therapeutic activity, Neuromuscular re-education, Patient/Family education, Joint mobilization, Dry Needling, Electrical stimulation, Cryotherapy, Moist heat, Taping, Ultrasound, Biofeedback, and Manual therapy   PLAN FOR NEXT SESSION:  Patient is having a biopsy  on Monday so only external work, see if pelvis in correct alignment, work on core strength  Earlie Counts, PT 08/20/22 4:59 PM   PHYSICAL THERAPY DISCHARGE SUMMARY  Visits from Start of Care: 6  Current functional level related to goals / functional outcomes: Patient called to be discharged due to her having to attend to another treatment first.    Remaining deficits: See above.    Education / Equipment: HEP   Patient agrees to discharge. Patient goals were partially met. Patient is being discharged due to the patient's request.  Thank you for the referral. Earlie Counts, PT 09/18/22 2:49 PM

## 2022-08-25 DIAGNOSIS — N96 Recurrent pregnancy loss: Secondary | ICD-10-CM | POA: Diagnosis not present

## 2022-08-25 DIAGNOSIS — N711 Chronic inflammatory disease of uterus: Secondary | ICD-10-CM | POA: Diagnosis not present

## 2022-08-27 ENCOUNTER — Ambulatory Visit: Payer: BC Managed Care – PPO | Admitting: Physical Therapy

## 2022-08-28 DIAGNOSIS — N96 Recurrent pregnancy loss: Secondary | ICD-10-CM | POA: Diagnosis not present

## 2022-09-05 DIAGNOSIS — F411 Generalized anxiety disorder: Secondary | ICD-10-CM | POA: Diagnosis not present

## 2022-09-05 DIAGNOSIS — F3132 Bipolar disorder, current episode depressed, moderate: Secondary | ICD-10-CM | POA: Diagnosis not present

## 2022-09-08 ENCOUNTER — Other Ambulatory Visit: Payer: Self-pay | Admitting: Internal Medicine

## 2022-09-08 DIAGNOSIS — I10 Essential (primary) hypertension: Secondary | ICD-10-CM

## 2022-09-08 MED ORDER — LABETALOL HCL 200 MG PO TABS: 200.0000 mg | ORAL_TABLET | Freq: Two times a day (BID) | ORAL | 0 refills | Status: DC

## 2022-09-10 ENCOUNTER — Encounter: Payer: BC Managed Care – PPO | Admitting: Physical Therapy

## 2022-09-15 ENCOUNTER — Encounter: Payer: BC Managed Care – PPO | Admitting: Physical Therapy

## 2022-09-22 ENCOUNTER — Encounter: Payer: BC Managed Care – PPO | Admitting: Physical Therapy

## 2022-09-27 DIAGNOSIS — Z113 Encounter for screening for infections with a predominantly sexual mode of transmission: Secondary | ICD-10-CM | POA: Diagnosis not present

## 2022-09-27 DIAGNOSIS — N979 Female infertility, unspecified: Secondary | ICD-10-CM | POA: Diagnosis not present

## 2022-09-27 DIAGNOSIS — Z3161 Procreative counseling and advice using natural family planning: Secondary | ICD-10-CM | POA: Diagnosis not present

## 2022-09-27 DIAGNOSIS — Z31430 Encounter of female for testing for genetic disease carrier status for procreative management: Secondary | ICD-10-CM | POA: Diagnosis not present

## 2022-09-29 ENCOUNTER — Encounter: Payer: BC Managed Care – PPO | Admitting: Physical Therapy

## 2022-10-03 DIAGNOSIS — F411 Generalized anxiety disorder: Secondary | ICD-10-CM | POA: Diagnosis not present

## 2022-10-03 DIAGNOSIS — F3132 Bipolar disorder, current episode depressed, moderate: Secondary | ICD-10-CM | POA: Diagnosis not present

## 2022-10-09 DIAGNOSIS — F411 Generalized anxiety disorder: Secondary | ICD-10-CM | POA: Diagnosis not present

## 2022-10-09 DIAGNOSIS — F3132 Bipolar disorder, current episode depressed, moderate: Secondary | ICD-10-CM | POA: Diagnosis not present

## 2022-10-10 ENCOUNTER — Telehealth: Payer: Self-pay | Admitting: Internal Medicine

## 2022-10-10 ENCOUNTER — Other Ambulatory Visit: Payer: Self-pay | Admitting: Internal Medicine

## 2022-10-10 DIAGNOSIS — E039 Hypothyroidism, unspecified: Secondary | ICD-10-CM

## 2022-10-10 MED ORDER — LEVOTHYROXINE SODIUM 50 MCG PO TABS: 50.0000 ug | ORAL_TABLET | Freq: Every day | ORAL | 2 refills | Status: DC

## 2022-10-10 NOTE — Telephone Encounter (Signed)
Patient called and said they had a family emergency, so they had to reschedule their appointment for 11/04/22. She would like to get a temporary refill for her levothyroxine (SYNTHROID) 50 MCG tablet to be sent to [facility], [place] - [address] AT [place]. Best callback is [phone].

## 2022-10-13 ENCOUNTER — Ambulatory Visit: Payer: BC Managed Care – PPO | Admitting: Internal Medicine

## 2022-10-21 ENCOUNTER — Ambulatory Visit: Payer: BC Managed Care – PPO | Admitting: Internal Medicine

## 2022-11-04 ENCOUNTER — Encounter: Payer: Self-pay | Admitting: Internal Medicine

## 2022-11-04 ENCOUNTER — Ambulatory Visit: Payer: BC Managed Care – PPO | Admitting: Internal Medicine

## 2022-11-04 VITALS — BP 116/78 | HR 79 | Temp 98.5°F | Ht 62.0 in | Wt 160.0 lb

## 2022-11-04 DIAGNOSIS — E039 Hypothyroidism, unspecified: Secondary | ICD-10-CM | POA: Diagnosis not present

## 2022-11-04 DIAGNOSIS — I10 Essential (primary) hypertension: Secondary | ICD-10-CM | POA: Diagnosis not present

## 2022-11-04 LAB — TSH: TSH: 1.11 u[IU]/mL (ref 0.35–5.50)

## 2022-11-04 MED ORDER — LABETALOL HCL 100 MG PO TABS: 100.0000 mg | ORAL_TABLET | Freq: Two times a day (BID) | ORAL | 1 refills | Status: DC

## 2022-11-04 MED ORDER — LEVOTHYROXINE SODIUM 50 MCG PO TABS: 50.0000 ug | ORAL_TABLET | Freq: Every day | ORAL | 1 refills | Status: DC

## 2022-11-04 NOTE — Patient Instructions (Signed)

## 2022-11-04 NOTE — Progress Notes (Unsigned)
Subjective:  Patient ID: Alexandra Escobar, female    DOB: 04-12-1981  Age: 42 y.o. MRN: [id number]  CC: Hypertension and Hypothyroidism   HPI Alexandra Escobar presents for f/up -  She has been working on her lifestyle modifications and has lost nearly 30 pounds over the last 4 months.  She feels well today and offers no complaints.  Outpatient Medications Prior to Visit  Medication Sig Dispense Refill   lamoTRIgine (LAMICTAL) 100 MG tablet Take 100 mg by mouth 2 (two) times daily.     co-enzyme Q-10 30 MG capsule Take 1 capsule (30 mg total) by mouth daily. 90 capsule 3   labetalol (NORMODYNE) 200 MG tablet Take 1 tablet (200 mg total) by mouth 2 (two) times daily. Annual appt due in April must see provider for future refills 180 tablet 0   levothyroxine (SYNTHROID) 50 MCG tablet Take 1 tablet (50 mcg total) by mouth daily before breakfast. 90 tablet 2   clonazePAM (KLONOPIN) 0.5 MG tablet Take 0.5 mg by mouth 2 (two) times daily as needed for anxiety.     Prenatal 28-0.8 MG TABS Take 1 tablet by mouth daily. 90 tablet 3   No facility-administered medications prior to visit.    ROS Review of Systems  Constitutional:  Negative for diaphoresis and fatigue.  HENT: Negative.    Eyes: Negative.   Respiratory:  Negative for chest tightness, shortness of breath and wheezing.   Cardiovascular:  Negative for chest pain, palpitations and leg swelling.  Gastrointestinal:  Negative for abdominal pain, constipation, diarrhea, nausea and vomiting.  Endocrine: Negative.  Negative for cold intolerance and heat intolerance.  Genitourinary: Negative.   Musculoskeletal: Negative.  Negative for arthralgias and myalgias.  Skin: Negative.   Neurological:  Negative for dizziness, weakness and light-headedness.  Hematological:  Negative for adenopathy. Does not bruise/bleed easily.  Psychiatric/Behavioral: Negative.      Objective:  BP 116/78 (BP Location: Left Arm, Patient Position: Sitting, Cuff Size:  Large)   Pulse 79   Temp 98.5 F (36.9 C) (Oral)   Ht  (1.575 m)   Wt 160 lb (72.6 kg)   SpO2 97%   BMI 29.26 kg/m   BP Readings from Last 3 Encounters:  11/04/22 116/78  06/26/22 138/88  04/30/22 116/70    Wt Readings from Last 3 Encounters:  11/04/22 160 lb (72.6 kg)  06/26/22 187 lb (84.8 kg)  04/30/22 197 lb 4.8 oz (89.5 kg)    Physical Exam Vitals reviewed.  HENT:     Nose: Nose normal.     Mouth/Throat:     Mouth: Mucous membranes are moist.  Eyes:     General: No scleral icterus.    Conjunctiva/sclera: Conjunctivae normal.  Cardiovascular:     Rate and Rhythm: Normal rate and regular rhythm.     Heart sounds: No murmur heard. Pulmonary:     Effort: Pulmonary effort is normal.     Breath sounds: No stridor. No wheezing, rhonchi or rales.  Abdominal:     General: Abdomen is flat.     Palpations: There is no mass.     Tenderness: There is no abdominal tenderness. There is no guarding.     Hernia: No hernia is present.  Skin:    General: Skin is warm and dry.  Neurological:     General: No focal deficit present.     Mental Status: She is alert. Mental status is at baseline.  Psychiatric:        Mood  and Affect: Mood normal.        Behavior: Behavior normal.     Lab Results  Component Value Date   WBC 8.1 06/26/2022   HGB 13.7 06/26/2022   HCT 40.7 06/26/2022   PLT 290.0 06/26/2022   GLUCOSE 102 (H) 06/26/2022   ALT 25 12/18/2021   AST 22 12/18/2021   NA 141 06/26/2022   K 3.7 06/26/2022   CL 107 06/26/2022   CREATININE 0.79 06/26/2022   BUN 9 06/26/2022   CO2 26 06/26/2022   TSH 1.11 11/04/2022   HGBA1C 5.9 06/26/2022    US OB LESS THAN 14 WEEKS WITH OB TRANSVAGINAL  Result Date: 04/30/2022 CLINICAL DATA:  Abdominal pain in pregnancy EXAM: OBSTETRIC <14 WK [place] AND TRANSVAGINAL OB US TECHNIQUE: Both transabdominal and transvaginal ultrasound examinations were performed for complete evaluation of the gestation as well as the maternal  uterus, adnexal regions, and pelvic cul-de-sac. Transvaginal technique was performed to assess early pregnancy. COMPARISON:  None Available. FINDINGS: Intrauterine gestational sac: None Yolk sac:  Not seen Embryo:  Not seen Cardiac Activity: Not seen Subchorionic hemorrhage:  None visualized. Maternal uterus/adnexae: There is 11 mm anechoic structure in right ovary, possibly a follicle. Small amount of free fluid is seen in pelvis. IMPRESSION: There is no demonstrable intrauterine gestational sac. If pregnancy test is positive, differential diagnostic possibilities would include very early IUP or failed gestation with complete abortion or ectopic gestation. Serial HCG estimations and follow-up sonogram as clinically warranted should be considered. Small amount of free fluid in pelvis may suggest recent rupture of ovarian cyst or follicle. No dominant adnexal masses are seen. Electronically Signed   By: Ernie Avena M.D.   On: 04/30/2022 15:05    Assessment & Plan:    Acquired hypothyroidism- She is euthyroid.  Will continue the current T4 dosage. -     TSH; Future -     Levothyroxine Sodium; Take 1 tablet (50 mcg total) by mouth daily before breakfast.  Dispense: 90 tablet; Refill: 1  Primary hypertension- Her blood pressure is overcontrolled.  Will decrease the dose of labetalol. -     Labetalol HCl; Take 1 tablet (100 mg total) by mouth 2 (two) times daily.  Dispense: 180 tablet; Refill: 1 -     TSH; Future     Follow-up: Return in about 6 months (around 05/06/2023).  Sanda Linger, MD

## 2022-11-10 ENCOUNTER — Other Ambulatory Visit: Payer: Self-pay | Admitting: Internal Medicine

## 2022-11-10 ENCOUNTER — Encounter: Payer: Self-pay | Admitting: Internal Medicine

## 2022-11-10 DIAGNOSIS — Z3A01 Less than 8 weeks gestation of pregnancy: Secondary | ICD-10-CM

## 2022-11-10 DIAGNOSIS — F419 Anxiety disorder, unspecified: Secondary | ICD-10-CM | POA: Diagnosis not present

## 2022-11-10 DIAGNOSIS — F3181 Bipolar II disorder: Secondary | ICD-10-CM | POA: Diagnosis not present

## 2022-11-14 DIAGNOSIS — F331 Major depressive disorder, recurrent, moderate: Secondary | ICD-10-CM | POA: Diagnosis not present

## 2022-11-14 DIAGNOSIS — F410 Panic disorder [episodic paroxysmal anxiety] without agoraphobia: Secondary | ICD-10-CM | POA: Diagnosis not present

## 2022-11-19 DIAGNOSIS — F410 Panic disorder [episodic paroxysmal anxiety] without agoraphobia: Secondary | ICD-10-CM | POA: Diagnosis not present

## 2022-11-19 DIAGNOSIS — F331 Major depressive disorder, recurrent, moderate: Secondary | ICD-10-CM | POA: Diagnosis not present

## 2022-11-27 DIAGNOSIS — Z3200 Encounter for pregnancy test, result unknown: Secondary | ICD-10-CM | POA: Diagnosis not present

## 2022-11-28 DIAGNOSIS — F331 Major depressive disorder, recurrent, moderate: Secondary | ICD-10-CM | POA: Diagnosis not present

## 2022-11-28 DIAGNOSIS — F410 Panic disorder [episodic paroxysmal anxiety] without agoraphobia: Secondary | ICD-10-CM | POA: Diagnosis not present

## 2022-12-01 ENCOUNTER — Encounter: Payer: BC Managed Care – PPO | Admitting: Internal Medicine

## 2022-12-04 DIAGNOSIS — F410 Panic disorder [episodic paroxysmal anxiety] without agoraphobia: Secondary | ICD-10-CM | POA: Diagnosis not present

## 2022-12-04 DIAGNOSIS — D219 Benign neoplasm of connective and other soft tissue, unspecified: Secondary | ICD-10-CM | POA: Diagnosis not present

## 2022-12-04 DIAGNOSIS — F331 Major depressive disorder, recurrent, moderate: Secondary | ICD-10-CM | POA: Diagnosis not present

## 2022-12-04 DIAGNOSIS — E282 Polycystic ovarian syndrome: Secondary | ICD-10-CM | POA: Diagnosis not present

## 2022-12-04 DIAGNOSIS — O0990 Supervision of high risk pregnancy, unspecified, unspecified trimester: Secondary | ICD-10-CM | POA: Diagnosis not present

## 2022-12-04 DIAGNOSIS — I10 Essential (primary) hypertension: Secondary | ICD-10-CM | POA: Diagnosis not present

## 2022-12-11 ENCOUNTER — Encounter: Payer: BC Managed Care – PPO | Admitting: Internal Medicine

## 2022-12-12 DIAGNOSIS — F410 Panic disorder [episodic paroxysmal anxiety] without agoraphobia: Secondary | ICD-10-CM | POA: Diagnosis not present

## 2022-12-12 DIAGNOSIS — F331 Major depressive disorder, recurrent, moderate: Secondary | ICD-10-CM | POA: Diagnosis not present

## 2022-12-16 DIAGNOSIS — O0900 Supervision of pregnancy with history of infertility, unspecified trimester: Secondary | ICD-10-CM | POA: Diagnosis not present

## 2022-12-18 DIAGNOSIS — F331 Major depressive disorder, recurrent, moderate: Secondary | ICD-10-CM | POA: Diagnosis not present

## 2022-12-18 DIAGNOSIS — F410 Panic disorder [episodic paroxysmal anxiety] without agoraphobia: Secondary | ICD-10-CM | POA: Diagnosis not present

## 2022-12-22 DIAGNOSIS — D219 Benign neoplasm of connective and other soft tissue, unspecified: Secondary | ICD-10-CM | POA: Diagnosis not present

## 2022-12-22 DIAGNOSIS — E282 Polycystic ovarian syndrome: Secondary | ICD-10-CM | POA: Diagnosis not present

## 2022-12-22 DIAGNOSIS — I10 Essential (primary) hypertension: Secondary | ICD-10-CM | POA: Diagnosis not present

## 2022-12-22 DIAGNOSIS — O0990 Supervision of high risk pregnancy, unspecified, unspecified trimester: Secondary | ICD-10-CM | POA: Diagnosis not present

## 2023-01-01 DIAGNOSIS — Z3685 Encounter for antenatal screening for Streptococcus B: Secondary | ICD-10-CM | POA: Diagnosis not present

## 2023-01-01 DIAGNOSIS — Z3401 Encounter for supervision of normal first pregnancy, first trimester: Secondary | ICD-10-CM | POA: Diagnosis not present

## 2023-01-01 DIAGNOSIS — Z3481 Encounter for supervision of other normal pregnancy, first trimester: Secondary | ICD-10-CM | POA: Diagnosis not present

## 2023-01-01 DIAGNOSIS — O09511 Supervision of elderly primigravida, first trimester: Secondary | ICD-10-CM | POA: Diagnosis not present

## 2023-01-01 DIAGNOSIS — Z3A11 11 weeks gestation of pregnancy: Secondary | ICD-10-CM | POA: Diagnosis not present

## 2023-01-05 LAB — OB RESULTS CONSOLE RPR: RPR: NONREACTIVE

## 2023-01-05 LAB — OB RESULTS CONSOLE HEPATITIS B SURFACE ANTIGEN: Hepatitis B Surface Ag: NEGATIVE

## 2023-01-05 LAB — OB RESULTS CONSOLE GC/CHLAMYDIA
Chlamydia: NEGATIVE
Neisseria Gonorrhea: NEGATIVE

## 2023-01-05 LAB — OB RESULTS CONSOLE RUBELLA ANTIBODY, IGM: Rubella: IMMUNE

## 2023-01-05 LAB — HEPATITIS C ANTIBODY: HCV Ab: NEGATIVE

## 2023-01-06 DIAGNOSIS — Z3A12 12 weeks gestation of pregnancy: Secondary | ICD-10-CM | POA: Diagnosis not present

## 2023-01-06 DIAGNOSIS — Z113 Encounter for screening for infections with a predominantly sexual mode of transmission: Secondary | ICD-10-CM | POA: Diagnosis not present

## 2023-01-06 DIAGNOSIS — Z3400 Encounter for supervision of normal first pregnancy, unspecified trimester: Secondary | ICD-10-CM | POA: Diagnosis not present

## 2023-01-06 DIAGNOSIS — O169 Unspecified maternal hypertension, unspecified trimester: Secondary | ICD-10-CM | POA: Diagnosis not present

## 2023-01-21 DIAGNOSIS — N760 Acute vaginitis: Secondary | ICD-10-CM | POA: Diagnosis not present

## 2023-02-04 DIAGNOSIS — Z361 Encounter for antenatal screening for raised alphafetoprotein level: Secondary | ICD-10-CM | POA: Diagnosis not present

## 2023-02-12 ENCOUNTER — Ambulatory Visit (INDEPENDENT_AMBULATORY_CARE_PROVIDER_SITE_OTHER): Payer: BC Managed Care – PPO | Admitting: Family Medicine

## 2023-02-12 ENCOUNTER — Encounter: Payer: Self-pay | Admitting: Family Medicine

## 2023-02-12 VITALS — BP 116/84 | HR 85 | Temp 97.6°F | Wt 195.4 lb

## 2023-02-12 DIAGNOSIS — R6889 Other general symptoms and signs: Secondary | ICD-10-CM | POA: Diagnosis not present

## 2023-02-12 DIAGNOSIS — R0982 Postnasal drip: Secondary | ICD-10-CM

## 2023-02-12 DIAGNOSIS — Z3A17 17 weeks gestation of pregnancy: Secondary | ICD-10-CM

## 2023-02-12 DIAGNOSIS — J069 Acute upper respiratory infection, unspecified: Secondary | ICD-10-CM

## 2023-02-12 DIAGNOSIS — R059 Cough, unspecified: Secondary | ICD-10-CM

## 2023-02-12 DIAGNOSIS — J029 Acute pharyngitis, unspecified: Secondary | ICD-10-CM

## 2023-02-12 LAB — POC COVID19 BINAXNOW: SARS Coronavirus 2 Ag: NEGATIVE

## 2023-02-12 LAB — POCT RAPID STREP A (OFFICE): Rapid Strep A Screen: NEGATIVE

## 2023-02-12 NOTE — Progress Notes (Signed)
Established Patient Office Visit   Subjective  Patient ID: Alexandra Escobar, female    DOB: 1981/05/06  Age: 42 y.o. MRN: [id number]  Chief Complaint  Patient presents with   Cough    Lot of coughing, phlegm at night, grey when spits up. Started about a week ago. Has not taken anything, just drinking teas, due to being pregnant. Alexandra Escobar laying down feels like she is choking, not sure if it is the mucus or something else.     Patient is a 42 year old female G3P00020 at 17.5 wks followed by Alexandra Linger, MD and seen for acute concern.  Patient endorses productive cough with thick gray mucus, itchy/sore throat x 1 week.  Sensation of thick mucus clogging throat makes it feel like patient cannot breathe at night.  Patient had a similar sensation while at the dentist this week.  Patient denies fever, chills, nausea, vomiting, ear pain/pressure, facial pain/pressure.  Patient taking hot teas for symptoms.  Denies sick contacts.  Cough   Past Medical History:  Diagnosis Date   Anxiety    Depression    Endometriosis    Fatty liver    Hypertension    PCOS (polycystic ovarian syndrome)    Past Surgical History:  Procedure Laterality Date   DILATION AND CURETTAGE OF UTERUS  06/2021   LAPAROSCOPY     for endometriosis   NASAL SEPTUM SURGERY     OTHER SURGICAL HISTORY     fibroid removal   OTHER SURGICAL HISTORY     fistula drainage   Social History   Tobacco Use   Smoking status: Never    Passive exposure: Never   Smokeless tobacco: Never  Substance Use Topics   Alcohol use: Never   Drug use: Never   Family History  Problem Relation Age of Onset   Hypertension Father    Depression Father    Hypertension Sister    Cancer Maternal Grandmother        breast   Hypertension Paternal Grandmother    No Known Allergies    Review of Systems  Respiratory:  Positive for cough.    Negative unless stated above    Objective:     BP 116/84 (BP Location: Left Arm, Patient Position:  Sitting, Cuff Size: Large)   Pulse 85   Temp 97.6 F (36.4 C) (Oral)   Wt 195 lb 6.4 oz (88.6 kg)   LMP 10/11/2022 (Approximate)   SpO2 96%   BMI 35.74 kg/m  BP Readings from Last 3 Encounters:  02/12/23 116/84  11/04/22 116/78  06/26/22 138/88   Wt Readings from Last 3 Encounters:  02/12/23 195 lb 6.4 oz (88.6 kg)  11/04/22 160 lb (72.6 kg)  06/26/22 187 lb (84.8 kg)      Physical Exam Constitutional:      General: She is not in acute distress.    Appearance: Normal appearance.  HENT:     Head: Normocephalic and atraumatic.     Nose: Nose normal.     Mouth/Throat:     Mouth: Mucous membranes are moist.  Cardiovascular:     Rate and Rhythm: Normal rate and regular rhythm.     Heart sounds: Normal heart sounds. No murmur heard.    No gallop.  Pulmonary:     Effort: Pulmonary effort is normal. No respiratory distress.     Breath sounds: Normal breath sounds. No wheezing, rhonchi or rales.  Skin:    General: Skin is warm and dry.  Neurological:  Mental Status: She is alert and oriented to person, place, and time.     Results for orders placed or performed in visit on 02/12/23  POC COVID-19  Result Value Ref Range   SARS Coronavirus 2 Ag Negative Negative  POC Rapid Strep A  Result Value Ref Range   Rapid Strep A Screen Negative Negative      Assessment & Plan:  Upper respiratory tract infection, unspecified type  Congestion of throat -     POC COVID-19 BinaxNow  Sore throat -     POCT rapid strep A  Cough, unspecified type -     POC COVID-19 BinaxNow  Post-nasal drainage  [redacted] weeks gestation of pregnancy  Acute URI symptoms likely viral.  COVID, strep testing negative.  Continue supportive care.  Discussed OTC medications safe in pregnancy including plain Robitussin, Ludens cough drops, Chloraseptic spray, Cepacol lozenges, Tylenol, etc.  Provided with a list.  Given strict precautions.  Return if symptoms worsen or fail to improve.   Deeann Saint, MD

## 2023-02-12 NOTE — Patient Instructions (Addendum)
Listed below are a few over-the-counter medications that are safe in pregnancy.  For cough you can use plain Robitussin cough syrup.  This can be found over-the-counter at your local drugstore.  It is not the version of Robitussin with the letters after the name.  You can also use Ludens cough drops.  For sore throat you can use Chloraseptic spray, Cepacol lozenges, Cepastat lozenges, Sucrets, gargle with warm salt water, and take Tylenol.  For sinus congestion you can use Actifed, Benadryl, saline nasal spray, or Claritin.   Strep and COVID testing were negative.

## 2023-02-13 ENCOUNTER — Ambulatory Visit: Payer: BC Managed Care – PPO | Admitting: Cardiology

## 2023-02-16 DIAGNOSIS — F410 Panic disorder [episodic paroxysmal anxiety] without agoraphobia: Secondary | ICD-10-CM | POA: Diagnosis not present

## 2023-02-16 DIAGNOSIS — F331 Major depressive disorder, recurrent, moderate: Secondary | ICD-10-CM | POA: Diagnosis not present

## 2023-02-24 DIAGNOSIS — Z363 Encounter for antenatal screening for malformations: Secondary | ICD-10-CM | POA: Diagnosis not present

## 2023-02-24 DIAGNOSIS — Z3A19 19 weeks gestation of pregnancy: Secondary | ICD-10-CM | POA: Diagnosis not present

## 2023-03-04 ENCOUNTER — Ambulatory Visit: Payer: BC Managed Care – PPO | Admitting: Cardiology

## 2023-03-06 ENCOUNTER — Ambulatory Visit: Payer: BC Managed Care – PPO | Attending: Cardiology | Admitting: Cardiology

## 2023-03-06 VITALS — BP 128/88 | HR 87 | Ht 62.0 in | Wt 205.4 lb

## 2023-03-06 DIAGNOSIS — O09522 Supervision of elderly multigravida, second trimester: Secondary | ICD-10-CM

## 2023-03-06 DIAGNOSIS — O99210 Obesity complicating pregnancy, unspecified trimester: Secondary | ICD-10-CM | POA: Diagnosis not present

## 2023-03-06 DIAGNOSIS — O10919 Unspecified pre-existing hypertension complicating pregnancy, unspecified trimester: Secondary | ICD-10-CM | POA: Diagnosis not present

## 2023-03-06 DIAGNOSIS — O0992 Supervision of high risk pregnancy, unspecified, second trimester: Secondary | ICD-10-CM | POA: Diagnosis not present

## 2023-03-06 DIAGNOSIS — Z3A20 20 weeks gestation of pregnancy: Secondary | ICD-10-CM

## 2023-03-06 NOTE — Patient Instructions (Signed)
    Follow-Up: At [facility], you and your health needs are our priority.  As part of our continuing mission to provide you with exceptional heart care, we have created designated Provider Care Teams.  These Care Teams include your primary Cardiologist (physician) and Advanced Practice Providers (APPs -  Physician Assistants and Nurse Practitioners) who all work together to provide you with the care you need, when you need it.  We recommend signing up for the patient portal called "MyChart".  Sign up information is provided on this After Visit Summary.  MyChart is used to connect with patients for Virtual Visits (Telemedicine).  Patients are able to view lab/test results, encounter notes, upcoming appointments, etc.  Non-urgent messages can be sent to your provider as well.   To learn more about what you can do with MyChart, go to [email or web address].    Your next appointment:   4 week(s)  Provider:   Thomasene Ripple MD

## 2023-03-06 NOTE — Progress Notes (Unsigned)
Cardio-Obstetrics Clinic  New Evaluation  Date:  03/08/2023   ID:  Evamaria, Gaudio 28-Jul-1980, MRN [id number]  PCP:  Etta Grandchild, MD   [facility] Providers Cardiologist:  Maisie Fus, MD  Electrophysiologist:  None       Referring MD: Tawni Levy, MD   Chief Complaint: " My blood pressure has been a problem"  History of Present Illness:    Lelah Baysinger is a 42 y.o. female [G3P0020] who is being seen today for the evaluation of chronic hypertension pregnancy at the request of Tawni Levy, MD.   Medical history includes chronic hypertension which was diagnosed in 2018, hypothyroidism and she is currently on levothyroxine here today to be evaluated for chronic hypertension in pregnancy. She has no specific complaints but tells me that her blood pressure has been closely monitored during this pregnancy.  No chest pain or shortness of breath.  She is currently [redacted] weeks pregnant.   Prior CV Studies Reviewed: The following studies were reviewed today:  TTE 05/19/2022  IMPRESSIONS    1. Left ventricular ejection fraction, by estimation, is 65 to 70%. The left ventricle has normal function. The left ventricle has no regional wall motion abnormalities. Left ventricular diastolic parameters were normal. The average left ventricular global longitudinal strain is -20.7 %. The global longitudinal strain is normal.   2. Right ventricular systolic function is normal. The right ventricular size is normal. There is normal pulmonary artery systolic pressure. The estimated right ventricular systolic pressure is 25.5 mmHg.   3. The mitral valve is grossly normal. Trivial mitral valve  regurgitation. No evidence of mitral stenosis.   4. The aortic valve is tricuspid. Aortic valve regurgitation is not  visualized. No aortic stenosis is present.   5. The inferior vena cava is normal in size with greater than 50%  respiratory variability, suggesting right atrial pressure of 3 mmHg.    Conclusion(s)/Recommendation(s): Normal biventricular function without  evidence of hemodynamically significant valvular heart disease.   FINDINGS   Left Ventricle: Left ventricular ejection fraction, by estimation, is 65  to 70%. The left ventricle has normal function. The left ventricle has no  regional wall motion abnormalities. The average left ventricular global  longitudinal strain is -20.7 %.  The global longitudinal strain is normal. 3D left ventricular ejection  fraction analysis performed but not reported based on interpreter  judgement due to suboptimal tracking. The left ventricular internal cavity  size was normal in size. There is no left  ventricular hypertrophy. Left ventricular diastolic parameters were  normal.   Right Ventricle: The right ventricular size is normal. No increase in  right ventricular wall thickness. Right ventricular systolic function is  normal. There is normal pulmonary artery systolic pressure. The tricuspid  regurgitant velocity is 2.37 m/s, and   with an assumed right atrial pressure of 3 mmHg, the estimated right  ventricular systolic pressure is 25.5 mmHg.   Left Atrium: Left atrial size was normal in size.   Right Atrium: Right atrial size was normal in size.   Pericardium: There is no evidence of pericardial effusion.   Mitral Valve: The mitral valve is grossly normal. Trivial mitral valve  regurgitation. No evidence of mitral valve stenosis.   Tricuspid Valve: The tricuspid valve is grossly normal. Tricuspid valve  regurgitation is trivial. No evidence of tricuspid stenosis.   Aortic Valve: The aortic valve is tricuspid. Aortic valve regurgitation is  not visualized. No aortic stenosis is present.  Pulmonic Valve: The pulmonic valve was grossly normal. Pulmonic valve  regurgitation is not visualized. No evidence of pulmonic stenosis.   Aorta: The aortic root and ascending aorta are structurally normal, with  no evidence of  dilitation.   Venous: The right lower pulmonary vein is normal. The inferior vena cava  is normal in size with greater than 50% respiratory variability,  suggesting right atrial pressure of 3 mmHg.   IAS/Shunts: The atrial septum is grossly normal.   Past Medical History:  Diagnosis Date   Anxiety    Depression    Endometriosis    Fatty liver    Hypertension    PCOS (polycystic ovarian syndrome)     Past Surgical History:  Procedure Laterality Date   DILATION AND CURETTAGE OF UTERUS  06/2021   LAPAROSCOPY     for endometriosis   NASAL SEPTUM SURGERY     OTHER SURGICAL HISTORY     fibroid removal   OTHER SURGICAL HISTORY     fistula drainage      OB History     Gravida  3   Para      Term      Preterm      AB  2   Living         SAB  2   IAB      Ectopic      Multiple  0   Live Births                  Current Medications: Current Meds  Medication Sig   labetalol (NORMODYNE) 100 MG tablet Take 1 tablet (100 mg total) by mouth 2 (two) times daily. (Patient taking differently: Take 200 mg by mouth 2 (two) times daily.)   levothyroxine (SYNTHROID) 50 MCG tablet Take 1 tablet (50 mcg total) by mouth daily before breakfast.   sertraline (ZOLOFT) 100 MG tablet Take 100 mg by mouth daily.     Allergies:   Patient has no known allergies.   Social History   Socioeconomic History   Marital status: Married    Spouse name: Not on file   Number of children: Not on file   Years of education: Not on file   Highest education level: Some college, no degree  Occupational History   Not on file  Tobacco Use   Smoking status: Never    Passive exposure: Never   Smokeless tobacco: Never  Substance and Sexual Activity   Alcohol use: Never   Drug use: Never   Sexual activity: Never    Partners: Male    Birth control/protection: None  Other Topics Concern   Not on file  Social History Narrative   Not on file   Social Determinants of Health    Financial Resource Strain: Low Risk  (11/03/2022)   Overall Financial Resource Strain (CARDIA)    Difficulty of Paying Living Expenses: Not hard at all  Food Insecurity: Patient Declined (11/03/2022)   Hunger Vital Sign    Worried About Running Out of Food in the Last Year: Patient declined    Ran Out of Food in the Last Year: Patient declined  Transportation Needs: Patient Declined (11/03/2022)   PRAPARE - Administrator, Civil Service (Medical): Patient declined    Lack of Transportation (Non-Medical): Patient declined  Physical Activity: Unknown (11/03/2022)   Exercise Vital Sign    Days of Exercise per Week: 0 days    Minutes of Exercise per Session: Not on file  Stress: No Stress Concern Present (11/03/2022)   [employer] of Occupational Health - Occupational Stress Questionnaire    Feeling of Stress : Only a little  Social Connections: Moderately Integrated (11/03/2022)   Social Connection and Isolation Panel [NHANES]    Frequency of Communication with Friends and Family: More than three times a week    Frequency of Social Gatherings with Friends and Family: More than three times a week    Attends Religious Services: More than 4 times per year    Active Member of [employer] or Organizations: No    Attends Engineer, structural: Not on file    Marital Status: Married      Family History  Problem Relation Age of Onset   Hypertension Father    Depression Father    Hypertension Sister    Cancer Maternal Grandmother        breast   Hypertension Paternal Grandmother       ROS:   Please see the history of present illness.     All other systems reviewed and are negative.   Labs/EKG Reviewed:    EKG:   EKG was not ordered today.    Recent Labs: 04/23/2022: Magnesium 1.8 06/26/2022: BUN 9; Creatinine, Ser 0.79; Hemoglobin 13.7; Platelets 290.0; Potassium 3.7; Sodium 141 11/04/2022: TSH 1.11   Recent Lipid Panel No results found for: "CHOL", "TRIG",  "HDL", "CHOLHDL", "LDLCALC", "LDLDIRECT"  Physical Exam:    VS:  BP 128/88   Pulse 87   Ht 5\' 2"  (1.575 m)   Wt 205 lb 6.4 oz (93.2 kg)   LMP 10/11/2022 (Approximate)   SpO2 97%   BMI 37.57 kg/m     Wt Readings from Last 3 Encounters:  03/06/23 205 lb 6.4 oz (93.2 kg)  02/12/23 195 lb 6.4 oz (88.6 kg)  11/04/22 160 lb (72.6 kg)     GEN:  Well nourished, well developed in no acute distress HEENT: Normal NECK: No JVD; No carotid bruits LYMPHATICS: No lymphadenopathy CARDIAC: RRR, no murmurs, rubs, gallops RESPIRATORY:  Clear to auscultation without rales, wheezing or rhonchi  ABDOMEN: Soft, non-tender, non-distended MUSCULOSKELETAL:  No edema; No deformity  SKIN: Warm and dry NEUROLOGIC:  Alert and oriented x 3 PSYCHIATRIC:  Normal affect    Risk Assessment/Risk Calculators:     CARPREG II Risk Prediction Index Score:  1.  The patient's risk for a primary cardiac event is 5%.            ASSESSMENT & PLAN:    Chronic hypertension pregnancy Advanced maternal age 48 weeks and 6 days gestation   Her blood pressure is at target but on the higher end today, I have asked the patient to go blood pressure daily and upload data MyChart in 1 week. I will see her back in 4 weeks she was asked to bring her blood pressure cuff. Continue her aspirin for preeclampsia prophylaxis. Will monitor closely Keep pregnancy weight gain between 11-20 lbs.   Patient Instructions     Follow-Up: At [facility], you and your health needs are our priority.  As part of our continuing mission to provide you with exceptional heart care, we have created designated Provider Care Teams.  These Care Teams include your primary Cardiologist (physician) and Advanced Practice Providers (APPs -  Physician Assistants and Nurse Practitioners) who all work together to provide you with the care you need, when you need it.  We recommend signing up for the patient portal called "MyChart".  Sign  up information is provided on this After Visit Summary.  MyChart is used to connect with patients for Virtual Visits (Telemedicine).  Patients are able to view lab/test results, encounter notes, upcoming appointments, etc.  Non-urgent messages can be sent to your provider as well.   To learn more about what you can do with MyChart, go to [email or web address].    Your next appointment:   4 week(s)  Provider:   Thomasene Ripple MD    Dispo:  No follow-ups on file.   Medication Adjustments/Labs and Tests Ordered: Current medicines are reviewed at length with the patient today.  Concerns regarding medicines are outlined above.  Tests Ordered: No orders of the defined types were placed in this encounter.  Medication Changes: No orders of the defined types were placed in this encounter.

## 2023-03-08 ENCOUNTER — Encounter: Payer: Self-pay | Admitting: Cardiology

## 2023-03-09 ENCOUNTER — Encounter (HOSPITAL_COMMUNITY): Payer: Self-pay | Admitting: Obstetrics and Gynecology

## 2023-03-09 ENCOUNTER — Inpatient Hospital Stay (HOSPITAL_COMMUNITY)
Admission: AD | Admit: 2023-03-09 | Discharge: 2023-03-09 | Disposition: A | Discharge status: Home / Self Care | Payer: BC Managed Care – PPO | Attending: Obstetrics and Gynecology | Admitting: Obstetrics and Gynecology

## 2023-03-09 ENCOUNTER — Other Ambulatory Visit: Payer: Self-pay

## 2023-03-09 DIAGNOSIS — O99342 Other mental disorders complicating pregnancy, second trimester: Secondary | ICD-10-CM | POA: Insufficient documentation

## 2023-03-09 DIAGNOSIS — Z79899 Other long term (current) drug therapy: Secondary | ICD-10-CM | POA: Insufficient documentation

## 2023-03-09 DIAGNOSIS — R1032 Left lower quadrant pain: Secondary | ICD-10-CM | POA: Insufficient documentation

## 2023-03-09 DIAGNOSIS — F419 Anxiety disorder, unspecified: Secondary | ICD-10-CM | POA: Insufficient documentation

## 2023-03-09 DIAGNOSIS — O26892 Other specified pregnancy related conditions, second trimester: Secondary | ICD-10-CM | POA: Diagnosis not present

## 2023-03-09 DIAGNOSIS — Z3A21 21 weeks gestation of pregnancy: Secondary | ICD-10-CM | POA: Insufficient documentation

## 2023-03-09 DIAGNOSIS — N949 Unspecified condition associated with female genital organs and menstrual cycle: Secondary | ICD-10-CM | POA: Diagnosis not present

## 2023-03-09 NOTE — MAU Provider Note (Signed)
History     CSN: [id number]  Arrival date and time: 03/09/23 1642    Chief Complaint  Patient presents with   Groin Pain   HPI [name] is a 42 y.o. year old G72P0020 female at [redacted]w[redacted]d weeks gestation who presents to MAU reporting pain in her LT groin that started at 1400. She rates the pain 7/10. She describes the pain as constant and throbbing. She took Tylenol 1000 mg today at 1100 without relief. She has tried to lay on her sides, but that increases her acid reflux and cause increased FM. She states that "the increased FM makes me very anxious. It makes think there is something wrong, like there is discomfort." She only is able to then lay on her back, but that causes her to have increased pressure and that is worrisome also. She has a h/o 2 miscarriages and she is very concerned that that will happen again in this pregnancy. She is very tearful stating that "I had to go through a lot to get pregnant with this baby. I went through IVF and then my husband decided that he no longer wanted to be with me. So he left. So I have no support at home." She has a h/o anxiety. She is currently being treated by a psychiatric PA for this. She takes Zoloft 100 mg po daily and Mirtazapam (sp?) 15 mg po daily. She also has a h/o pelvic pain after her last D&C and has been managed with pelvic rehab; her next appt is 03/12/2023. She receives [facility] with [facility]; next appt is next month.   OB History     Gravida  3   Para      Term      Preterm      AB  2   Living         SAB  2   IAB      Ectopic      Multiple  0   Live Births              Past Medical History:  Diagnosis Date   Anxiety    Depression    Endometriosis    Fatty liver    Hypertension    PCOS (polycystic ovarian syndrome)     Past Surgical History:  Procedure Laterality Date   DILATION AND CURETTAGE OF UTERUS  06/2021   LAPAROSCOPY     for endometriosis   NASAL SEPTUM SURGERY     OTHER  SURGICAL HISTORY     fibroid removal   OTHER SURGICAL HISTORY     fistula drainage    Family History  Problem Relation Age of Onset   Hypertension Father    Depression Father    Hypertension Sister    Cancer Maternal Grandmother        breast   Hypertension Paternal Grandmother     Social History   Tobacco Use   Smoking status: Never    Passive exposure: Never   Smokeless tobacco: Never  Substance Use Topics   Alcohol use: Never   Drug use: Never    Allergies: No Known Allergies  Medications Prior to Admission  Medication Sig Dispense Refill Last Dose   labetalol (NORMODYNE) 100 MG tablet Take 1 tablet (100 mg total) by mouth 2 (two) times daily. (Patient taking differently: Take 200 mg by mouth 2 (two) times daily.) 180 tablet 1    lamoTRIgine (LAMICTAL) 100 MG tablet Take 100 mg by mouth 2 (  two) times daily.      levothyroxine (SYNTHROID) 50 MCG tablet Take 1 tablet (50 mcg total) by mouth daily before breakfast. 90 tablet 1    sertraline (ZOLOFT) 100 MG tablet Take 100 mg by mouth daily.       Review of Systems  Constitutional: Negative.   HENT: Negative.    Respiratory: Negative.    Cardiovascular: Negative.   Gastrointestinal: Negative.   Endocrine: Negative.   Genitourinary:  Positive for pelvic pain (LT groin area).  Musculoskeletal: Negative.   Skin: Negative.   Allergic/Immunologic: Negative.   Neurological: Negative.   Hematological: Negative.   Psychiatric/Behavioral:  The patient is nervous/anxious.    Physical Exam   Blood pressure 116/82, pulse 86, temperature 98.2 F (36.8 C), temperature source Oral, resp. rate 18, height 5\' 2"  (1.575 m), weight 94.3 kg, last menstrual period 10/11/2022, SpO2 98%, unknown if currently breastfeeding.  Physical Exam Vitals and nursing note reviewed.  Constitutional:      Appearance: She is normal weight.  Cardiovascular:     Rate and Rhythm: Normal rate.  Pulmonary:     Effort: Pulmonary effort is normal.   Abdominal:     General: Abdomen is flat.     Palpations: Abdomen is soft.  Genitourinary:    Comments: Not indicated Musculoskeletal:        General: Normal range of motion.  Skin:    General: Skin is warm and dry.  Neurological:     Mental Status: She is oriented to person, place, and time.  Psychiatric:        Attention and Perception: Attention normal.        Mood and Affect: Mood is anxious. Affect is tearful.        Speech: Speech normal.        Behavior: Behavior normal. Behavior is cooperative.        Thought Content: Thought content normal.        Cognition and Memory: Cognition and memory normal.        Judgment: Judgment normal.    FHTs by doppler: 140 bpm  MAU Course  Procedures Patient informed that the ultrasound is considered a limited OB ultrasound and is not intended to be a complete ultrasound exam.  Patient also informed that the ultrasound is not being completed with the intent of assessing for fetal or placental anomalies or any pelvic abnormalities.  Explained that the purpose of today's ultrasound is to assess for viability.  Baby was found to be in a variable presentations. Two pictures given to patient. Patient reassured crying tears of joy. Patient acknowledges the purpose of the exam and the limitations of the study.  MDM FHTs Informal BS U/S  Assessment and Plan  1. Round ligament pain - Information provided on RLP, demonstration on how to relieve RLP, advised to soak in tub with Epsom salt in warm water, lay on affected side  2. Anxiety during pregnancy in second trimester, antepartum - Advised to contact psychiatrist for a sooner appointment and possible dosage change - Advised to notify [facility] of her condition so they have it on-file of her mental health status in case she has a crisis and will need an emergency inpatient admission - Information provided on managing anxiety, mindfulness-based stress reduction and perinatal mood and anxiety disorder    3. [redacted] weeks gestation of pregnancy   - Discharge patient - Keep scheduled appt with [facility] next month - Patient verbalized an understanding of the plan of care and  agrees.  Raelyn Mora, CNM 03/09/2023, 7:48 PM

## 2023-03-09 NOTE — Discharge Instructions (Signed)
Comfort Measures for Round Ligament Pain:  Soak in a warm tub Tylenol 1000 mg by mouth every 6-8 hrs as needed for pain Slow position changes Do pelvic massages and chair stretches (as demonstrated to you) Laying on the affected side     Round Ligament Pain During Pregnancy Round ligament pain is a sharp pain or jabbing feeling often felt in the lower belly or groin area on one or both sides. It is one of the most common complaints during pregnancy and is considered a normal part of pregnancy. It is most often felt during the second trimester.  Here is what you need to know about round ligament pain, including some tips to help you feel better.  Causes of Round Ligament Pain  Several thick ligaments surround and support your womb (uterus) as it grows during pregnancy. One of them is called the round ligament.  The round ligament connects the front part of the womb to your groin, the area where your legs attach to your pelvis. The round ligament normally tightens and relaxes slowly.  As your baby and womb grow, the round ligament stretches. That makes it more likely to become strained.  Sudden movements can cause the ligament to tighten quickly, like a rubber band snapping. This causes a sudden and quick jabbing feeling.  Symptoms of Round Ligament Pain  Round ligament pain can be concerning and uncomfortable. But it is considered normal as your body changes during pregnancy.  The symptoms of round ligament pain include a sharp, sudden spasm in the belly. It usually affects the right side, but it may happen on both sides. The pain only lasts a few seconds.  Exercise may cause the pain, as will rapid movements such as:  sneezing coughing laughing rolling over in bed standing up too quickly  Comfort Measures:  Soak in a warm tub Tylenol 1000 mg by mouth every 6-8 hrs as needed for pain Slow position changes Laying on the affected side    Massage: Starting at the middle of  your pubic bone, trace little circles in a wide U from your pubic bone to your hip bones on both sides.  Then starting just above your pubic bone, press in and down, alternating sides to create a gentle rocking of your uterus back and forth.  Move your hands up the sides of your belly and back down. Do this 3-5 times upon waking and before bed.   Stretches: Get on hands and knees and alternate arching your back deeply while inhaling, and then rounding your back while exhaling. Modified runners lunge:  - Sit on a chair with half of your bottom on the chair and half off.  - Sit up tall, plant your front foot, and stretch your other foot out behind you.  - Breathe deeply for 5 breaths and then do the other side.   Also chiropractors and massages are safe in pregnancy. [facility] in [place] specializes in pregnancy care. Please let us know if you have any other questions or concerns.

## 2023-03-09 NOTE — MAU Note (Addendum)
Alexandra Escobar is a 42 y.o. at [redacted]w[redacted]d here in MAU reporting: pain in the left groin area that began today @ 1400.  Reports the pain is constant and throbbing.  Reports took Tylenol 1000 mg today 1100, no relief noted Denies VB or LOF.  Reports +FM. Pt reports she has increased anxiety regarding the pregnancy.  States has had 2 miscarriages. LMP: NA Onset of complaint: today Pain score: 7 Vitals:   03/09/23 1720  BP: 116/82  Pulse: 86  Resp: 18  Temp: 98.2 F (36.8 C)  SpO2: 98%     FHT:140 bpm Lab orders placed from triage:   None

## 2023-03-11 DIAGNOSIS — F410 Panic disorder [episodic paroxysmal anxiety] without agoraphobia: Secondary | ICD-10-CM | POA: Diagnosis not present

## 2023-03-11 DIAGNOSIS — F331 Major depressive disorder, recurrent, moderate: Secondary | ICD-10-CM | POA: Diagnosis not present

## 2023-03-12 ENCOUNTER — Ambulatory Visit: Payer: BC Managed Care – PPO | Attending: Obstetrics and Gynecology | Admitting: Physical Therapy

## 2023-03-12 ENCOUNTER — Other Ambulatory Visit: Payer: Self-pay

## 2023-03-12 DIAGNOSIS — R293 Abnormal posture: Secondary | ICD-10-CM | POA: Diagnosis not present

## 2023-03-12 DIAGNOSIS — R1084 Generalized abdominal pain: Secondary | ICD-10-CM | POA: Insufficient documentation

## 2023-03-12 DIAGNOSIS — M6281 Muscle weakness (generalized): Secondary | ICD-10-CM | POA: Insufficient documentation

## 2023-03-12 DIAGNOSIS — R102 Pelvic and perineal pain: Secondary | ICD-10-CM | POA: Diagnosis not present

## 2023-03-12 DIAGNOSIS — R279 Unspecified lack of coordination: Secondary | ICD-10-CM | POA: Diagnosis not present

## 2023-03-12 DIAGNOSIS — M62838 Other muscle spasm: Secondary | ICD-10-CM | POA: Insufficient documentation

## 2023-03-12 NOTE — Therapy (Signed)
OUTPATIENT PHYSICAL THERAPY FEMALE PELVIC EVALUATION   Patient Name: Alexandra Escobar MRN: [id number] DOB:06/05/1981, 42 y.o., female Today's Date: 03/12/2023  END OF SESSION:  PT End of Session - 03/12/23 1621     Visit Number 1    Date for PT Re-Evaluation 06/21/23    Authorization Type BCBS    PT Start Time 1616    PT Stop Time 1655    PT Time Calculation (min) 39 min    Activity Tolerance Patient tolerated treatment well    Behavior During Therapy WFL for tasks assessed/performed             Past Medical History:  Diagnosis Date   Anxiety    Depression    Endometriosis    Fatty liver    Hypertension    PCOS (polycystic ovarian syndrome)    Past Surgical History:  Procedure Laterality Date   DILATION AND CURETTAGE OF UTERUS  06/2021   LAPAROSCOPY     for endometriosis   NASAL SEPTUM SURGERY     OTHER SURGICAL HISTORY     fibroid removal   OTHER SURGICAL HISTORY     fistula drainage   Patient Active Problem List   Diagnosis Date Noted   Hypertension    Severe episode of recurrent major depressive disorder, without psychotic features (HCC) 12/18/2021   Acquired hypothyroidism 10/22/2021   Encounter for general adult medical examination with abnormal findings 10/21/2021    PCP: Sanda Linger, MD  REFERRING PROVIDER: Tawni Levy, MD  REFERRING DIAG: N39.3 stress incontinence   THERAPY DIAG:  Muscle weakness (generalized) - Plan: PT plan of care cert/re-cert  Abnormal posture - Plan: PT plan of care cert/re-cert  Unspecified lack of coordination - Plan: PT plan of care cert/re-cert  Other muscle spasm - Plan: PT plan of care cert/re-cert  Rationale for Evaluation and Treatment: Rehabilitation  ONSET DATE: 2 weeks ago for pain  SUBJECTIVE:                                                                                                                                                                                           SUBJECTIVE STATEMENT: Lt  side pelvic pain mostly at groin, TTP at pubic bone. Leakage with sneezing, coughing, vomiting, lifting and laughing. Reports had PT previously for pelvic floor after second D&C  Due date 07/18/23  Fluid intake: Yes: water- 45oz; rarely anything else    PAIN:  Are you having pain? Yes NPRS scale: 7/10 Pain location:  lt groin  Pain type: aching Pain description: constant   Aggravating factors: worsened stress/anxiety Relieving factors: relaxing/resting  PRECAUTIONS: Other: [redacted] weeks pregnant  RED FLAGS: None   WEIGHT BEARING RESTRICTIONS: No  FALLS:  Has patient fallen in last 6 months? No  LIVING ENVIRONMENT: Lives with: lives with their family Lives in: House/apartment   OCCUPATION: not currently  PLOF: I   PATIENT GOALS: to have less pain  PERTINENT HISTORY:  Endometriosis, PCOS,  anxiety, depression, D&C Sexual abuse: No  BOWEL MOVEMENT: Pain with bowel movement: No Type of bowel movement:Type (Bristol Stool Scale) 4, Frequency daily, and Strain No Fully empty rectum: Yes:   Leakage: No Pads: No Fiber supplement: No  URINATION: Pain with urination: No Fully empty bladder: Yes:   Stream: Strong Urgency: Yes: worse with pregnancy  but did have urgency prior  Frequency: 20 mins during the day; at least every hour at night Leakage: Coughing, Sneezing, Laughing, Exercise, and Lifting Pads: No  INTERCOURSE: Pain with intercourse:  not painful Ability to have vaginal penetration:  Yes:   Climax: not painful Marinoff Scale: 0/3  PREGNANCY: Vaginal deliveries 0  C-section deliveries 0 Currently pregnant Yes: 21 weeks  PROLAPSE: None   OBJECTIVE:   DIAGNOSTIC FINDINGS:    COGNITION: Overall cognitive status: Within functional limits for tasks assessed     SENSATION: Light touch: Appears intact Proprioception: Appears intact  MUSCLE LENGTH: Bil hamstrings and adductors limited by 25%   POSTURE: rounded shoulders, forward head, and  increased lumbar lordosis  PELVIC ALIGNMENT:WFL  LUMBARAROM/PROM:  A/PROM A/PROM  eval  Flexion WFL  Extension WFL  Right lateral flexion WFL  Left lateral flexion WFL  Right rotation Limited by 25% and had pain in Lt groin  Left rotation Limited by 25%   (Blank rows = not tested)  LOWER EXTREMITY ROM:  WFL  LOWER EXTREMITY MMT:  Bil hip adductors 3/5 all other hip MMT 4/5, knees 5/5  PALPATION:   General  TTP at bil lumbar paraspinals with worse area at L4-L5 and spinal process                External Perineal Exam deferred                              Internal Pelvic Floor deferred  Patient confirms identification and approves PT to assess internal pelvic floor and treatment No  PELVIC MMT:   MMT eval  Vaginal   Internal Anal Sphincter   External Anal Sphincter   Puborectalis   Diastasis Recti   (Blank rows = not tested)        TONE: Deferred   PROLAPSE: Deferred   TODAY'S TREATMENT:                                                                                                                              DATE:   03/12/23 EVAL Examination completed, findings reviewed, pt educated on POC, HEP. Pt denied any allergies to tape/adhesives and agreeable to tape at abdomen to see if this  helps for support and decreased pain. Educated on how/when to remove tape and to remove if any skin irritation occurs. Pt agreed. Pt motivated to participate in PT and agreeable to attempt recommendations.     PATIENT EDUCATION:  Education details: [email or web address] Person educated: Patient Education method: Explanation, Demonstration, Tactile cues, Verbal cues, and Handouts Education comprehension: verbalized understanding and returned demonstration  HOME EXERCISE PROGRAM: [email or web address]  ASSESSMENT:  CLINICAL IMPRESSION: Patient is a 42 y.o. female  who was seen today for physical therapy evaluation and treatment for urinary leakage and Lt sided pelvic pain. Pt found to have  decreased flexibility at spine and bil hips, decreased core and hip strengthen,is currently pregnant and this has made hip pain worse. Pt states she also has urinary leakage with stressors but not full losses of urine. Pt would benefit from additional PT to further address deficits.    OBJECTIVE IMPAIRMENTS: decreased activity tolerance, decreased coordination, decreased endurance, decreased strength, increased fascial restrictions, increased muscle spasms, impaired flexibility, improper body mechanics, postural dysfunction, and pain.   ACTIVITY LIMITATIONS: lifting, squatting, and continence  PARTICIPATION LIMITATIONS: community activity  PERSONAL FACTORS: Fitness and 1 comorbidity: currently pregnant  are also affecting patient's functional outcome.   REHAB POTENTIAL: Good  CLINICAL DECISION MAKING: Stable/uncomplicated  EVALUATION COMPLEXITY: Low   GOALS: Goals reviewed with patient? Yes  SHORT TERM GOALS: Target date: 04/09/23  Pt to be I with HEP.  Baseline: Goal status: INITIAL  LONG TERM GOALS: Target date: 06/21/23  Pt to be I with advanced HEP.  Baseline:  Goal status: INITIAL  2.  Pt to demonstrate at least 5/5 bil hip strength for improved pelvic stability and functional squats without leakage.  Baseline:  Goal status: INITIAL  3.  Pt to report at least 50% decreased in urinary leakage with stressors for improved skin integrity.  Baseline:  Goal status: INITIAL  4.  Pt to report no more than 3/10 pain at Lt groin due to improved stability at pelvis  Baseline: 7/10 Goal status: INITIAL   PLAN:  PT FREQUENCY: 2x/week  PT DURATION: 8 weeks  PLANNED INTERVENTIONS: Therapeutic exercises, Therapeutic activity, Neuromuscular re-education, Patient/Family education, Self Care, Joint mobilization, Aquatic Therapy, Dry Needling, Spinal mobilization, Cryotherapy, Moist heat, scar mobilization, Taping, Biofeedback, Manual therapy, and Re-evaluation  PLAN FOR NEXT  SESSION: strengthening of hips and core, stretching spine and hips, coordination of pelvic floor with activity, hip openers   Otelia Sergeant, PT, DPT 08/22/245:08 PM

## 2023-03-13 DIAGNOSIS — O09812 Supervision of pregnancy resulting from assisted reproductive technology, second trimester: Secondary | ICD-10-CM | POA: Diagnosis not present

## 2023-03-13 DIAGNOSIS — Z3A21 21 weeks gestation of pregnancy: Secondary | ICD-10-CM | POA: Diagnosis not present

## 2023-03-13 DIAGNOSIS — Z136 Encounter for screening for cardiovascular disorders: Secondary | ICD-10-CM | POA: Diagnosis not present

## 2023-03-17 DIAGNOSIS — F410 Panic disorder [episodic paroxysmal anxiety] without agoraphobia: Secondary | ICD-10-CM | POA: Diagnosis not present

## 2023-03-17 DIAGNOSIS — F331 Major depressive disorder, recurrent, moderate: Secondary | ICD-10-CM | POA: Diagnosis not present

## 2023-03-17 NOTE — Therapy (Signed)
OUTPATIENT PHYSICAL THERAPY TREATMENT   Patient Name: Alexandra Escobar MRN: [id number] DOB:[birth year], 42 y.o., female Today's Date: 03/18/2023  END OF SESSION:  PT End of Session - 03/18/23 1407     Visit Number 2    Date for PT Re-Evaluation 06/21/23    Authorization Type [employer]BS    PT Start Time 1408    PT Stop Time 1446    PT Time Calculation (min) 38 min    Activity Tolerance Patient tolerated treatment well              Past Medical History:  Diagnosis Date   Anxiety    Depression    Endometriosis    Fatty liver    Hypertension    PCOS (polycystic ovarian syndrome)    Past Surgical History:  Procedure Laterality Date   DILATION AND CURETTAGE OF UTERUS  06/2021   LAPAROSCOPY     for endometriosis   NASAL SEPTUM SURGERY     OTHER SURGICAL HISTORY     fibroid removal   OTHER SURGICAL HISTORY     fistula drainage   Patient Active Problem List   Diagnosis Date Noted   Hypertension    Severe episode of recurrent major depressive disorder, without psychotic features (HCC) 12/18/2021   Acquired hypothyroidism 10/22/2021   Encounter for general adult medical examination with abnormal findings 10/21/2021    PCP: Sanda Linger, MD  REFERRING PROVIDER: Tawni Levy, MD  REFERRING DIAG: N39.3 stress incontinence   THERAPY DIAG:  Muscle weakness (generalized)  Abnormal posture  Unspecified lack of coordination  Other muscle spasm  Generalized abdominal pain  Pelvic pain  Rationale for Evaluation and Treatment: Rehabilitation  ONSET DATE: 2 weeks ago for pain  SUBJECTIVE:                                                                                                                                                                                           SUBJECTIVE STATEMENT: The exercises felt good. I felt the stretch. Felt some pressure last night in pelvic bone. Liked the tape but forgot she had [place] the next day so had to take off.  Due date  07/18/23  Fluid intake: Yes: water- 45oz; rarely anything else    PAIN:  Are you having pain? Yes NPRS scale: 6/10 Pain location:  lt groin  Pain type: aching Pain description: constant   Aggravating factors: worsened stress/anxiety Relieving factors: relaxing/resting  PRECAUTIONS: Other: [redacted] weeks pregnant   RED FLAGS: None   WEIGHT BEARING RESTRICTIONS: No  FALLS:  Has patient fallen in last 6 months? No  LIVING ENVIRONMENT: Lives with: lives with  their family Lives in: House/apartment   OCCUPATION: not currently  PLOF: I   PATIENT GOALS: to have less pain  PERTINENT HISTORY:  Endometriosis, PCOS,  anxiety, depression, D&C Sexual abuse: No  BOWEL MOVEMENT: Pain with bowel movement: No Type of bowel movement:Type (Bristol Stool Scale) 4, Frequency daily, and Strain No Fully empty rectum: Yes:   Leakage: No Pads: No Fiber supplement: No  URINATION: Pain with urination: No Fully empty bladder: Yes:   Stream: Strong Urgency: Yes: worse with pregnancy  but did have urgency prior  Frequency: 20 mins during the day; at least every hour at night Leakage: Coughing, Sneezing, Laughing, Exercise, and Lifting Pads: No  INTERCOURSE: Pain with intercourse:  not painful Ability to have vaginal penetration:  Yes:   Climax: not painful Marinoff Scale: 0/3  PREGNANCY: Vaginal deliveries 0  C-section deliveries 0 Currently pregnant Yes: 21 weeks  PROLAPSE: None   OBJECTIVE:   DIAGNOSTIC FINDINGS:    COGNITION: Overall cognitive status: Within functional limits for tasks assessed     SENSATION: Light touch: Appears intact Proprioception: Appears intact  MUSCLE LENGTH: Bil hamstrings and adductors limited by 25%   POSTURE: rounded shoulders, forward head, and increased lumbar lordosis  PELVIC ALIGNMENT:WFL  LUMBARAROM/PROM:  A/PROM A/PROM  eval  Flexion WFL  Extension WFL  Right lateral flexion WFL  Left lateral flexion WFL  Right  rotation Limited by 25% and had pain in Lt groin  Left rotation Limited by 25%   (Blank rows = not tested)  LOWER EXTREMITY ROM:  WFL  LOWER EXTREMITY MMT:  Bil hip adductors 3/5 all other hip MMT 4/5, knees 5/5  PALPATION:   General  TTP at bil lumbar paraspinals with worse area at L4-L5 and spinal process                External Perineal Exam deferred                              Internal Pelvic Floor deferred  Patient confirms identification and approves PT to assess internal pelvic floor and treatment No  PELVIC MMT:   MMT eval  Vaginal   Internal Anal Sphincter   External Anal Sphincter   Puborectalis   Diastasis Recti   (Blank rows = not tested)        TONE: Deferred   PROLAPSE: Deferred   TODAY'S TREATMENT:                                                                                                                              DATE:   03/18/23 Nustep L 5 x 2.5 min then L3 x 2.5 min Hooklying hip IR/ER x 10 ea B Hooklying butterfly  x 30 sec with breathing Bridging 5 sec x  10 Hooklying clam yellow loop with TA contraction x 10 S/L clam x 10 B S/L hip ABD x 10 B Open  book x 5 B Quadriped rocking x 10 and then with prolonged holds Cat/cow x 5  Seated fig 4 x 30 sec B  Manual: tape to abdomen for support  03/12/23 EVAL Examination completed, findings reviewed, pt educated on POC, HEP. Pt denied any allergies to tape/adhesives and agreeable to tape at abdomen to see if this helps for support and decreased pain. Educated on how/when to remove tape and to remove if any skin irritation occurs. Pt agreed. Pt motivated to participate in PT and agreeable to attempt recommendations.     PATIENT EDUCATION:  Education details: [email or web address] Person educated: Patient Education method: Explanation, Demonstration, Tactile cues, Verbal cues, and Handouts Education comprehension: verbalized understanding and returned demonstration  HOME EXERCISE PROGRAM: Access Code:  [email or web address] URL: [email or web address] Date: 03/18/2023 Prepared by: Raynelle Fanning  Exercises - Child's Pose Stretch  - 1 x daily - 7 x weekly - 2 sets - 3 reps - 45s hold - Quadruped Rocking Slow  - 1 x daily - 7 x weekly - 2 sets - 3 reps - 45s hold - Sidelying Open Book Thoracic Lumbar Rotation and Extension  - 1 x daily - 7 x weekly - 2 sets - 10 reps - Supine Bilateral Hip Internal Rotation Stretch  - 1 x daily - 7 x weekly - 2 sets - 10 reps - Supine Butterfly Groin Stretch  - 2 x daily - 7 x weekly - 1 sets - 3 reps - 60 sec hold - Clamshell  - 1 x daily - 7 x weekly - 1-3 sets - 10 reps  ASSESSMENT:  CLINICAL IMPRESSION: Rashidah returns for her first f/u visit after eval. She liked the tape but had to remove secondary to an [place] she was having the next day. She tolerated new TE well without complaint of pain. Tape reapplied to add support.   OBJECTIVE IMPAIRMENTS: decreased activity tolerance, decreased coordination, decreased endurance, decreased strength, increased fascial restrictions, increased muscle spasms, impaired flexibility, improper body mechanics, postural dysfunction, and pain.   ACTIVITY LIMITATIONS: lifting, squatting, and continence  PARTICIPATION LIMITATIONS: community activity  PERSONAL FACTORS: Fitness and 1 comorbidity: currently pregnant  are also affecting patient's functional outcome.   REHAB POTENTIAL: Good  CLINICAL DECISION MAKING: Stable/uncomplicated  EVALUATION COMPLEXITY: Low   GOALS: Goals reviewed with patient? Yes  SHORT TERM GOALS: Target date: 04/09/23  Pt to be I with HEP.  Baseline: Goal status: INITIAL  LONG TERM GOALS: Target date: 06/21/23  Pt to be I with advanced HEP.  Baseline:  Goal status: INITIAL  2.  Pt to demonstrate at least 5/5 bil hip strength for improved pelvic stability and functional squats without leakage.  Baseline:  Goal status: INITIAL  3.  Pt to report at least 50% decreased in urinary leakage with  stressors for improved skin integrity.  Baseline:  Goal status: INITIAL  4.  Pt to report no more than 3/10 pain at Lt groin due to improved stability at pelvis  Baseline: 7/10 Goal status: INITIAL   PLAN:  PT FREQUENCY: 2x/week  PT DURATION: 8 weeks  PLANNED INTERVENTIONS: Therapeutic exercises, Therapeutic activity, Neuromuscular re-education, Patient/Family education, Self Care, Joint mobilization, Aquatic Therapy, Dry Needling, Spinal mobilization, Cryotherapy, Moist heat, scar mobilization, Taping, Biofeedback, Manual therapy, and Re-evaluation  PLAN FOR NEXT SESSION: strengthening of hips and core, stretching spine and hips, coordination of pelvic floor with activity, hip openers   Solon Palm, PT  08/28/243:40 PM

## 2023-03-18 ENCOUNTER — Ambulatory Visit: Payer: BC Managed Care – PPO | Admitting: Physical Therapy

## 2023-03-18 ENCOUNTER — Encounter: Payer: Self-pay | Admitting: Physical Therapy

## 2023-03-18 DIAGNOSIS — M6281 Muscle weakness (generalized): Secondary | ICD-10-CM | POA: Diagnosis not present

## 2023-03-18 DIAGNOSIS — M62838 Other muscle spasm: Secondary | ICD-10-CM | POA: Diagnosis not present

## 2023-03-18 DIAGNOSIS — R1084 Generalized abdominal pain: Secondary | ICD-10-CM

## 2023-03-18 DIAGNOSIS — R102 Pelvic and perineal pain: Secondary | ICD-10-CM | POA: Diagnosis not present

## 2023-03-18 DIAGNOSIS — R279 Unspecified lack of coordination: Secondary | ICD-10-CM | POA: Diagnosis not present

## 2023-03-18 DIAGNOSIS — R293 Abnormal posture: Secondary | ICD-10-CM | POA: Diagnosis not present

## 2023-03-19 DIAGNOSIS — F53 Postpartum depression: Secondary | ICD-10-CM | POA: Diagnosis not present

## 2023-03-20 ENCOUNTER — Ambulatory Visit: Payer: BC Managed Care – PPO | Admitting: Physical Therapy

## 2023-03-20 DIAGNOSIS — R102 Pelvic and perineal pain: Secondary | ICD-10-CM | POA: Diagnosis not present

## 2023-03-20 DIAGNOSIS — R279 Unspecified lack of coordination: Secondary | ICD-10-CM

## 2023-03-20 DIAGNOSIS — M6281 Muscle weakness (generalized): Secondary | ICD-10-CM | POA: Diagnosis not present

## 2023-03-20 DIAGNOSIS — R1084 Generalized abdominal pain: Secondary | ICD-10-CM | POA: Diagnosis not present

## 2023-03-20 DIAGNOSIS — R293 Abnormal posture: Secondary | ICD-10-CM

## 2023-03-20 DIAGNOSIS — M62838 Other muscle spasm: Secondary | ICD-10-CM | POA: Diagnosis not present

## 2023-03-20 NOTE — Therapy (Signed)
OUTPATIENT PHYSICAL THERAPY TREATMENT   Patient Name: Alexandra Escobar MRN: [id number] DOB:[birth year], 42 y.o., female Today's Date: 03/20/2023  END OF SESSION:  PT End of Session - 03/20/23 1025     Visit Number 3    Date for PT Re-Evaluation 06/21/23    Authorization Type BCBS    PT Start Time 1020    PT Stop Time 1059    PT Time Calculation (min) 39 min    Activity Tolerance Patient tolerated treatment well    Behavior During Therapy WFL for tasks assessed/performed              Past Medical History:  Diagnosis Date   Anxiety    Depression    Endometriosis    Fatty liver    Hypertension    PCOS (polycystic ovarian syndrome)    Past Surgical History:  Procedure Laterality Date   DILATION AND CURETTAGE OF UTERUS  06/2021   LAPAROSCOPY     for endometriosis   NASAL SEPTUM SURGERY     OTHER SURGICAL HISTORY     fibroid removal   OTHER SURGICAL HISTORY     fistula drainage   Patient Active Problem List   Diagnosis Date Noted   Hypertension    Severe episode of recurrent major depressive disorder, without psychotic features (HCC) 12/18/2021   Acquired hypothyroidism 10/22/2021   Encounter for general adult medical examination with abnormal findings 10/21/2021    PCP: Alexandra Linger, MD  REFERRING PROVIDER: Tawni Levy, MD  REFERRING DIAG: N39.3 stress incontinence   THERAPY DIAG:  Muscle weakness (generalized)  Abnormal posture  Unspecified lack of coordination  Rationale for Evaluation and Treatment: Rehabilitation  ONSET DATE: 2 weeks ago for pain  SUBJECTIVE:                                                                                                                                                                                           SUBJECTIVE STATEMENT: Pt reports tape was helpful overall pain has been improving. "Much less" pubic pain - haven't noticed it.   Due date 07/18/23  Fluid intake: Yes: water- 45oz; rarely anything else     PAIN:  Are you having pain? Yes NPRS scale: 4/10 Pain location:  bil hip  Pain type: aching Pain description: constant   Aggravating factors: worsened stress/anxiety Relieving factors: relaxing/resting  PRECAUTIONS: Other: [redacted] weeks pregnant   RED FLAGS: None   WEIGHT BEARING RESTRICTIONS: No  FALLS:  Has patient fallen in last 6 months? No  LIVING ENVIRONMENT: Lives with: lives with their family Lives in: House/apartment   OCCUPATION: not currently  PLOF: I  PATIENT GOALS: to have less pain  PERTINENT HISTORY:  Endometriosis, PCOS,  anxiety, depression, D&C Sexual abuse: No  BOWEL MOVEMENT: Pain with bowel movement: No Type of bowel movement:Type (Bristol Stool Scale) 4, Frequency daily, and Strain No Fully empty rectum: Yes:   Leakage: No Pads: No Fiber supplement: No  URINATION: Pain with urination: No Fully empty bladder: Yes:   Stream: Strong Urgency: Yes: worse with pregnancy  but did have urgency prior  Frequency: 20 mins during the day; at least every hour at night Leakage: Coughing, Sneezing, Laughing, Exercise, and Lifting Pads: No  INTERCOURSE: Pain with intercourse:  not painful Ability to have vaginal penetration:  Yes:   Climax: not painful Marinoff Scale: 0/3  PREGNANCY: Vaginal deliveries 0  C-section deliveries 0 Currently pregnant Yes: 21 weeks  PROLAPSE: None   OBJECTIVE:   DIAGNOSTIC FINDINGS:    COGNITION: Overall cognitive status: Within functional limits for tasks assessed     SENSATION: Light touch: Appears intact Proprioception: Appears intact  MUSCLE LENGTH: Bil hamstrings and adductors limited by 25%   POSTURE: rounded shoulders, forward head, and increased lumbar lordosis  PELVIC ALIGNMENT:WFL  LUMBARAROM/PROM:  A/PROM A/PROM  eval  Flexion WFL  Extension WFL  Right lateral flexion WFL  Left lateral flexion WFL  Right rotation Limited by 25% and had pain in Lt groin  Left rotation Limited  by 25%   (Blank rows = not tested)  LOWER EXTREMITY ROM:  WFL  LOWER EXTREMITY MMT:  Bil hip adductors 3/5 all other hip MMT 4/5, knees 5/5  PALPATION:   General  TTP at bil lumbar paraspinals with worse area at L4-L5 and spinal process                External Perineal Exam deferred                              Internal Pelvic Floor deferred  Patient confirms identification and approves PT to assess internal pelvic floor and treatment No  PELVIC MMT:   MMT eval  Vaginal   Internal Anal Sphincter   External Anal Sphincter   Puborectalis   Diastasis Recti   (Blank rows = not tested)        TONE: Deferred   PROLAPSE: Deferred   TODAY'S TREATMENT:                                                                                                                              DATE:   03/20/23  Foam rolling for piriformis bil x5 each Nustep L 5 x 2.5 min then L3 x 2.5 min Hooklying hip IR/ER x 10 each bil Hooklying butterfly  2x 30 sec with breathing Sidelying  clam x 10 red loop bil Sidelying reverse clam x 10 red loop bil Quadriped rocking into diagonally into heel x 5 each Cat/cow x 5   fig 4 x 30  sec Bil x5 Sit to stand with gentle pre-contraction of TA    PATIENT EDUCATION:  Education details: [email or web address] Person educated: Patient Education method: Explanation, Demonstration, Tactile cues, Verbal cues, and Handouts Education comprehension: verbalized understanding and returned demonstration  HOME EXERCISE PROGRAM: Access Code: [email or web address] URL: [email or web address] Date: 03/18/2023 Prepared by: Alexandra Escobar  Exercises - Child's Pose Stretch  - 1 x daily - 7 x weekly - 2 sets - 3 reps - 45s hold - Quadruped Rocking Slow  - 1 x daily - 7 x weekly - 2 sets - 3 reps - 45s hold - Sidelying Open Book Thoracic Lumbar Rotation and Extension  - 1 x daily - 7 x weekly - 2 sets - 10 reps - Supine Bilateral Hip Internal Rotation Stretch  - 1 x daily - 7 x weekly - 2  sets - 10 reps - Supine Butterfly Groin Stretch  - 2 x daily - 7 x weekly - 1 sets - 3 reps - 60 sec hold - Clamshell  - 1 x daily - 7 x weekly - 1-3 sets - 10 reps  ASSESSMENT:  CLINICAL IMPRESSION: Alexandra Escobar returns for treatment, states she has been feeling a lot better, much less pain overall. Still has tape in place and reports this is still helping pain. No new tape applied. She tolerated session well, did need brief rest breaks due to fatigue but no pain or concerns per pt. Pt very pleased with progress so far. Pt would benefit from additional PT to further address deficits.    OBJECTIVE IMPAIRMENTS: decreased activity tolerance, decreased coordination, decreased endurance, decreased strength, increased fascial restrictions, increased muscle spasms, impaired flexibility, improper body mechanics, postural dysfunction, and pain.   ACTIVITY LIMITATIONS: lifting, squatting, and continence  PARTICIPATION LIMITATIONS: community activity  PERSONAL FACTORS: Fitness and 1 comorbidity: currently pregnant  are also affecting patient's functional outcome.   REHAB POTENTIAL: Good  CLINICAL DECISION MAKING: Stable/uncomplicated  EVALUATION COMPLEXITY: Low   GOALS: Goals reviewed with patient? Yes  SHORT TERM GOALS: Target date: 04/09/23  Pt to be I with HEP.  Baseline: Goal status: INITIAL  LONG TERM GOALS: Target date: 06/21/23  Pt to be I with advanced HEP.  Baseline:  Goal status: INITIAL  2.  Pt to demonstrate at least 5/5 bil hip strength for improved pelvic stability and functional squats without leakage.  Baseline:  Goal status: INITIAL  3.  Pt to report at least 50% decreased in urinary leakage with stressors for improved skin integrity.  Baseline:  Goal status: INITIAL  4.  Pt to report no more than 3/10 pain at Lt groin due to improved stability at pelvis  Baseline: 7/10 Goal status: INITIAL   PLAN:  PT FREQUENCY: 2x/week  PT DURATION: 8 weeks  PLANNED  INTERVENTIONS: Therapeutic exercises, Therapeutic activity, Neuromuscular re-education, Patient/Family education, Self Care, Joint mobilization, Aquatic Therapy, Dry Needling, Spinal mobilization, Cryotherapy, Moist heat, scar mobilization, Taping, Biofeedback, Manual therapy, and Re-evaluation  PLAN FOR NEXT SESSION: strengthening of hips and core, stretching spine and hips, coordination of pelvic floor with activity, hip openers   Alexandra Escobar Riddles, PT  03/19/2410:00 AM

## 2023-03-25 DIAGNOSIS — F53 Postpartum depression: Secondary | ICD-10-CM | POA: Diagnosis not present

## 2023-03-26 ENCOUNTER — Ambulatory Visit: Payer: BC Managed Care – PPO | Admitting: Physical Therapy

## 2023-03-28 ENCOUNTER — Encounter (HOSPITAL_COMMUNITY): Payer: Self-pay | Admitting: Obstetrics and Gynecology

## 2023-03-28 ENCOUNTER — Inpatient Hospital Stay (HOSPITAL_COMMUNITY)
Admission: AD | Admit: 2023-03-28 | Discharge: 2023-03-29 | Disposition: A | Discharge status: Home / Self Care | Payer: BC Managed Care – PPO | Attending: Obstetrics and Gynecology | Admitting: Obstetrics and Gynecology

## 2023-03-28 ENCOUNTER — Other Ambulatory Visit: Payer: Self-pay

## 2023-03-28 DIAGNOSIS — O10012 Pre-existing essential hypertension complicating pregnancy, second trimester: Secondary | ICD-10-CM | POA: Insufficient documentation

## 2023-03-28 DIAGNOSIS — R519 Headache, unspecified: Secondary | ICD-10-CM | POA: Diagnosis not present

## 2023-03-28 DIAGNOSIS — O10919 Unspecified pre-existing hypertension complicating pregnancy, unspecified trimester: Secondary | ICD-10-CM

## 2023-03-28 DIAGNOSIS — I10 Essential (primary) hypertension: Secondary | ICD-10-CM

## 2023-03-28 DIAGNOSIS — Z3A24 24 weeks gestation of pregnancy: Secondary | ICD-10-CM | POA: Diagnosis not present

## 2023-03-28 DIAGNOSIS — O26892 Other specified pregnancy related conditions, second trimester: Secondary | ICD-10-CM | POA: Insufficient documentation

## 2023-03-28 DIAGNOSIS — O10912 Unspecified pre-existing hypertension complicating pregnancy, second trimester: Secondary | ICD-10-CM

## 2023-03-28 HISTORY — DX: Hypothyroidism, unspecified: E03.9

## 2023-03-28 MED ORDER — ACETAMINOPHEN 500 MG PO TABS
1000.0000 mg | ORAL_TABLET | Freq: Once | ORAL | Status: AC
  Administered 2023-03-28: 1000 mg via ORAL
  Filled 2023-03-28: qty 2

## 2023-03-28 NOTE — MAU Provider Note (Signed)
Chief Complaint:  Headache and Hypertension   HPI  HPI: Alexandra Escobar is a 42 y.o. G3P0020 at 20w0dwho presents to maternity admissions reporting elevated blood pressure and headache since 2000. Not tried anything for h/a. Chronic ongoing left shoulder tingling. Increased b/l LE swelling x2 weeks. Denies hx of headaches. Wears glasses/contacts.   She reports good fetal movement, denies LOF, vaginal bleeding, vaginal itching/burning, urinary symptoms, h/a, dizziness, n/v, diarrhea, constipation or fever/chills.  She denies headache, visual changes or RUQ abdominal pain.  Hx of two SAB, anxiety, CHTN on labetalol 200mg  BID, limited social supports per chart review.  Past Medical History: Past Medical History:  Diagnosis Date   Anxiety    Depression    Endometriosis    Fatty liver    Hypertension    Hypothyroidism    PCOS (polycystic ovarian syndrome)     Past obstetric history: OB History  Gravida Para Term Preterm AB Living  3       2    SAB IAB Ectopic Multiple Live Births  2     0      # Outcome Date GA Lbr Len/2nd Weight Sex Type Anes PTL Lv  3 Current           2 SAB           1 SAB      SAB       Past Surgical History: Past Surgical History:  Procedure Laterality Date   DILATION AND CURETTAGE OF UTERUS  06/2021   LAPAROSCOPY     for endometriosis   NASAL SEPTUM SURGERY     OTHER SURGICAL HISTORY     fibroid removal   OTHER SURGICAL HISTORY     fistula drainage    Family History: Family History  Problem Relation Age of Onset   Hypertension Father    Depression Father    Hypertension Sister    Cancer Maternal Grandmother        breast   Hypertension Paternal Grandmother     Social History: Social History   Tobacco Use   Smoking status: Never    Passive exposure: Never   Smokeless tobacco: Never  Vaping Use   Vaping status: Never Used  Substance Use Topics   Alcohol use: Never   Drug use: Never    Allergies: No Known Allergies  Meds:   Medications Prior to Admission  Medication Sig Dispense Refill Last Dose   labetalol (NORMODYNE) 100 MG tablet Take 1 tablet (100 mg total) by mouth 2 (two) times daily. (Patient taking differently: Take 200 mg by mouth 2 (two) times daily.) 180 tablet 1 03/27/2023 at 2100   levothyroxine (SYNTHROID) 50 MCG tablet Take 1 tablet (50 mcg total) by mouth daily before breakfast. 90 tablet 1 03/27/2023 at 0800   sertraline (ZOLOFT) 100 MG tablet Take 100 mg by mouth daily.   03/27/2023    I have reviewed patient's Past Medical Hx, Surgical Hx, Family Hx, Social Hx, medications and allergies.   ROS:  Review of Systems Other systems negative  Physical Exam  Patient Vitals for the past 24 hrs:  BP Temp Temp src Pulse Resp SpO2 Height Weight  03/28/23 2347 121/70 -- -- 80 -- -- -- --  03/28/23 2346 -- -- -- -- -- 99 % -- --  03/28/23 2341 -- -- -- -- -- 99 % -- --  03/28/23 2333 (!) 141/74 -- -- 84 17 -- -- --  03/28/23 2331 -- -- -- -- -- 99 % -- --  03/28/23 2313 (!) 140/88 97.9 F (36.6 C) Oral 85 19 98 % 5\' 2"  (1.575 m) 102 kg   Constitutional: Well-developed, well-nourished female in no acute distress.  Cardiovascular: normal rate and rhythm Respiratory: normal effort, clear to auscultation bilaterally GI: Abd soft, non-tender, gravid appropriate for gestational age.   No rebound or guarding. MS: Extremities nontender, mild LE edema, normal ROM Neurologic: Alert and oriented x 4.  GU: Neg CVAT. DTR: +2, no clonus   FHT:  138 Contractions: denies   Labs: No results found for this or any previous visit (from the past 24 hour(s)). --/--/O POS (10/11 1415)  Imaging:  No results found.  MAU Course/MDM: I have reviewed the triage vital signs and the nursing notes.   Pertinent labs & imaging results that were available during my care of the patient were reviewed by me and considered in my medical decision making (see chart for details) I have reviewed her medical records including past  results, notes and treatments.   I have ordered labs and reviewed results.  NST reviewed  Treatments in MAU included 1000mg  Tylenol, CBC, CMP, Urine protein/creatinine.    Assessment: 1. [redacted] weeks gestation of pregnancy   2. Chronic hypertension affecting pregnancy   3. Acute nonintractable headache, unspecified headache type   CHTN: mild range SBP 140s Increased home labetalol to TID 200mg  at last prenatal visit Tuesday per patient report - Preeclampsia labs ***  H/a: Trial 1000mg  Tylenol and PO hydration -   Plan: Discharge home Continue TID labetalol Labor precautions and fetal kick counts Follow up in Office for prenatal visits and recheck  Follow-up Information     [place] [facility] Follow up.   Why: Continue routine prenatal care as scheduled  Sooner for worsening symptoms or labor concerns Contact information: [address] [place] [address] [phone]                Pt stable at time of discharge.  Wyn Forster, MD FMOB Fellow, Faculty practice [facility], [facility] for [facility]  03/28/2023 11:51 PM

## 2023-03-28 NOTE — MAU Note (Signed)
.  Alexandra Escobar is a 42 y.o. at [redacted]w[redacted]d here in MAU reporting: started noticing a HA and having nausea. Check BP earlier - diastolic was 90's-100s. Reports Hx of hypertension outside of the pregnancy. Took BP medication 2 hours ago - on labetalol 200mg  3x a day. Denies taking any medications for pain. Reports ongoing tingling in left shoulder for the past month. Reports increased swelling in both feet over the was 2 weeks. Denies VB, LOF, or ctx. +FM   Onset of complaint: 2000 Pain score: 7 - HA; 4 - shoulder  Vitals:   03/28/23 2313  BP: (!) 140/88  Pulse: 85  Resp: 19  Temp: 97.9 F (36.6 C)  SpO2: 98%     FHT:138 Lab orders placed from triage:  UA

## 2023-03-29 LAB — CBC
HCT: 33.4 % — ABNORMAL LOW (ref 36.0–46.0)
Hemoglobin: 11.7 g/dL — ABNORMAL LOW (ref 12.0–15.0)
MCH: 32.5 pg (ref 26.0–34.0)
MCHC: 35 g/dL (ref 30.0–36.0)
MCV: 92.8 fL (ref 80.0–100.0)
Platelets: 319 10*3/uL (ref 150–400)
RBC: 3.6 MIL/uL — ABNORMAL LOW (ref 3.87–5.11)
RDW: 14.6 % (ref 11.5–15.5)
WBC: 15.9 10*3/uL — ABNORMAL HIGH (ref 4.0–10.5)
nRBC: 0 % (ref 0.0–0.2)

## 2023-03-29 LAB — COMPREHENSIVE METABOLIC PANEL
ALT: 21 U/L (ref 0–44)
AST: 24 U/L (ref 15–41)
Albumin: 2.6 g/dL — ABNORMAL LOW (ref 3.5–5.0)
Alkaline Phosphatase: 75 U/L (ref 38–126)
Anion gap: 8 (ref 5–15)
BUN: 8 mg/dL (ref 6–20)
CO2: 19 mmol/L — ABNORMAL LOW (ref 22–32)
Calcium: 8.3 mg/dL — ABNORMAL LOW (ref 8.9–10.3)
Chloride: 105 mmol/L (ref 98–111)
Creatinine, Ser: 0.69 mg/dL (ref 0.44–1.00)
GFR, Estimated: >60 mL/min (ref >60)
Glucose, Bld: 108 mg/dL — ABNORMAL HIGH (ref 70–99)
Potassium: 3.9 mmol/L (ref 3.5–5.1)
Sodium: 132 mmol/L — ABNORMAL LOW (ref 135–145)
Total Bilirubin: <0.1 mg/dL — ABNORMAL LOW (ref 0.3–1.2)
Total Protein: 5.8 g/dL — ABNORMAL LOW (ref 6.5–8.1)

## 2023-03-29 LAB — URINALYSIS, ROUTINE W REFLEX MICROSCOPIC
Bilirubin Urine: NEGATIVE
Glucose, UA: NEGATIVE mg/dL
Hgb urine dipstick: NEGATIVE
Ketones, ur: NEGATIVE mg/dL
Leukocytes,Ua: NEGATIVE
Nitrite: NEGATIVE
Protein, ur: NEGATIVE mg/dL
Specific Gravity, Urine: 1.013 (ref 1.005–1.030)
pH: 7 (ref 5.0–8.0)

## 2023-03-29 LAB — PROTEIN / CREATININE RATIO, URINE
Creatinine, Urine: 70 mg/dL
Protein Creatinine Ratio: 0.1 mg/mg{creat} (ref 0.00–0.15)
Total Protein, Urine: 7 mg/dL

## 2023-03-29 NOTE — MAU Note (Signed)
Fetal HR tracing appropriate for gestational age-baby remained active throughout tracing causing brief breaks in tracing

## 2023-03-30 NOTE — Therapy (Addendum)
 OUTPATIENT PHYSICAL THERAPY TREATMENT AND DISCHARGE SUMMARY   Patient Name: Alexandra Escobar MRN: [id number] DOB:[birth year], 42 y.o., female Today's Date: 03/31/2023  END OF SESSION:  PT End of Session - 03/31/23 1620     Visit Number 4    Date for PT Re-Evaluation 06/21/23    Authorization Type BCBS    PT Start Time 1620    PT Stop Time 1700    PT Time Calculation (min) 40 min    Activity Tolerance Patient tolerated treatment well    Behavior During Therapy WFL for tasks assessed/performed               Past Medical History:  Diagnosis Date   Anxiety    Depression    Endometriosis    Fatty liver    Hypertension    Hypothyroidism    PCOS (polycystic ovarian syndrome)    Past Surgical History:  Procedure Laterality Date   DILATION AND CURETTAGE OF UTERUS  06/2021   LAPAROSCOPY     for endometriosis   NASAL SEPTUM SURGERY     OTHER SURGICAL HISTORY     fibroid removal   OTHER SURGICAL HISTORY     fistula drainage   Patient Active Problem List   Diagnosis Date Noted   Hypertension    Severe episode of recurrent major depressive disorder, without psychotic features (HCC) 12/18/2021   Acquired hypothyroidism 10/22/2021   Encounter for general adult medical examination with abnormal findings 10/21/2021    PCP: Sanda Linger, MD  REFERRING PROVIDER: Tawni Levy, MD  REFERRING DIAG: N39.3 stress incontinence   THERAPY DIAG:  Muscle weakness (generalized)  Abnormal posture  Other muscle spasm  Unspecified lack of coordination  Generalized abdominal pain  Pelvic pain  Rationale for Evaluation and Treatment: Rehabilitation  ONSET DATE: 2 weeks ago for pain  SUBJECTIVE:                                                                                                                                                                                           SUBJECTIVE STATEMENT: Patient reports no pain, but she states the tape is making her skin itchy and  dry. She plans to get a belly band instead. She still has a little pain lying on her L side, but reports it is much less than before.   Due date 07/18/23  Fluid intake: Yes: water- 45oz; rarely anything else    PAIN:  Are you having pain? Yes NPRS scale: 0/10 Pain location:  bil hip  Pain type: aching Pain description: constant   Aggravating factors: worsened stress/anxiety Relieving factors: relaxing/resting  PRECAUTIONS: Other: [redacted] weeks  pregnant   RED FLAGS: None   WEIGHT BEARING RESTRICTIONS: No  FALLS:  Has patient fallen in last 6 months? No  LIVING ENVIRONMENT: Lives with: lives with their family Lives in: House/apartment   OCCUPATION: not currently  PLOF: I   PATIENT GOALS: to have less pain  PERTINENT HISTORY:  Endometriosis, PCOS,  anxiety, depression, D&C Sexual abuse: No  BOWEL MOVEMENT: Pain with bowel movement: No Type of bowel movement:Type (Bristol Stool Scale) 4, Frequency daily, and Strain No Fully empty rectum: Yes:   Leakage: No Pads: No Fiber supplement: No  URINATION: Pain with urination: No Fully empty bladder: Yes:   Stream: Strong Urgency: Yes: worse with pregnancy  but did have urgency prior  Frequency: 20 mins during the day; at least every hour at night Leakage: Coughing, Sneezing, Laughing, Exercise, and Lifting Pads: No  INTERCOURSE: Pain with intercourse:  not painful Ability to have vaginal penetration:  Yes:   Climax: not painful Marinoff Scale: 0/3  PREGNANCY: Vaginal deliveries 0  C-section deliveries 0 Currently pregnant Yes: 21 weeks  PROLAPSE: None   OBJECTIVE:   DIAGNOSTIC FINDINGS:    COGNITION: Overall cognitive status: Within functional limits for tasks assessed     SENSATION: Light touch: Appears intact Proprioception: Appears intact  MUSCLE LENGTH: Bil hamstrings and adductors limited by 25%   POSTURE: rounded shoulders, forward head, and increased lumbar lordosis  PELVIC  ALIGNMENT:WFL  LUMBARAROM/PROM:  A/PROM A/PROM  eval  Flexion WFL  Extension WFL  Right lateral flexion WFL  Left lateral flexion WFL  Right rotation Limited by 25% and had pain in Lt groin  Left rotation Limited by 25%   (Blank rows = not tested)  LOWER EXTREMITY ROM:  WFL  LOWER EXTREMITY MMT:  Bil hip adductors 3/5 all other hip MMT 4/5, knees 5/5  PALPATION:   General  TTP at bil lumbar paraspinals with worse area at L4-L5 and spinal process                External Perineal Exam deferred                              Internal Pelvic Floor deferred  Patient confirms identification and approves PT to assess internal pelvic floor and treatment No  PELVIC MMT:   MMT eval  Vaginal   Internal Anal Sphincter   External Anal Sphincter   Puborectalis   Diastasis Recti   (Blank rows = not tested)        TONE: Deferred   PROLAPSE: Deferred   TODAY'S TREATMENT:                                                                                                                              DATE:    03/31/23  Nustep  L3 x 5 min Seated fig 4 2 x 30 sec B    5 Sit to stand  with gentle pre-contraction of TA, then 3 more with focus on exhale upon standing Hooklying breathing working on doubling the exhale time Hooklying hip IR/ER x 10 each bil Hooklying butterfly  2x 30 sec with breathing Sidelying  clam x 10 no loop due to feeling more pressure Sidelying reverse clam with ball between knees Quadriped rocking into diagonally into heel x 5 each Cat/cow x 5  Seated hip ADDuctor stretch 2x30 sec  Seated hip flexor stretch 2x30 sec  Discussed sleeping position  03/20/23  Foam rolling for piriformis bil x5 each Nustep L 5 x 2.5 min then L3 x 2.5 min Hooklying hip IR/ER x 10 each bil Hooklying butterfly  2x 30 sec with breathing Sidelying  clam x 10 red loop bil Sidelying reverse clam x 10 red loop bil Quadriped rocking into diagonally into heel x 5 each Cat/cow x 5    fig 4 x 30 sec Bil x5 Sit to stand with gentle pre-contraction of TA    PATIENT EDUCATION:  Education details: [email or web address] Person educated: Patient Education method: Explanation, Demonstration, Actor cues, Verbal cues, and Handouts Education comprehension: verbalized understanding and returned demonstration  HOME EXERCISE PROGRAM: Access Code: [email or web address] URL: [email or web address] Date: 03/18/2023 Prepared by: Raynelle Fanning  Exercises - Child's Pose Stretch  - 1 x daily - 7 x weekly - 2 sets - 3 reps - 45s hold - Quadruped Rocking Slow  - 1 x daily - 7 x weekly - 2 sets - 3 reps - 45s hold - Sidelying Open Book Thoracic Lumbar Rotation and Extension  - 1 x daily - 7 x weekly - 2 sets - 10 reps - Supine Bilateral Hip Internal Rotation Stretch  - 1 x daily - 7 x weekly - 2 sets - 10 reps - Supine Butterfly Groin Stretch  - 2 x daily - 7 x weekly - 1 sets - 3 reps - 60 sec hold - Clamshell  - 1 x daily - 7 x weekly - 1-3 sets - 10 reps  ASSESSMENT:  CLINICAL IMPRESSION: Rhyder reports increased irritation with KT tape, but plans to get a baby band for support. She did well with therex today, but did report some pressure with clams using the band so resistance was removed. She really liked the seated hip ADDuctor and flexor stretches so these were added to HEP. We will attempt deep squat next visit possible using stool for support. Overall Jaydence's pain is decreasing and is intermittent at this time. She continues to demonstrate potential for improvement and would benefit from continued skilled therapy to address impairments.     OBJECTIVE IMPAIRMENTS: decreased activity tolerance, decreased coordination, decreased endurance, decreased strength, increased fascial restrictions, increased muscle spasms, impaired flexibility, improper body mechanics, postural dysfunction, and pain.   ACTIVITY LIMITATIONS: lifting, squatting, and continence  PARTICIPATION LIMITATIONS: community  activity  PERSONAL FACTORS: Fitness and 1 comorbidity: currently pregnant  are also affecting patient's functional outcome.   REHAB POTENTIAL: Good  CLINICAL DECISION MAKING: Stable/uncomplicated  EVALUATION COMPLEXITY: Low   GOALS: Goals reviewed with patient? Yes  SHORT TERM GOALS: Target date: 04/09/23  Pt to be I with HEP.  Baseline: Goal status: INITIAL  LONG TERM GOALS: Target date: 06/21/23  Pt to be I with advanced HEP.  Baseline:  Goal status: INITIAL  2.  Pt to demonstrate at least 5/5 bil hip strength for improved pelvic stability and functional squats without leakage.  Baseline:  Goal status: INITIAL  3.  Pt to report at least 50% decreased  in urinary leakage with stressors for improved skin integrity.  Baseline:  Goal status: INITIAL  4.  Pt to report no more than 3/10 pain at Lt groin due to improved stability at pelvis  Baseline: 7/10 Goal status: INITIAL   PLAN:  PT FREQUENCY: 2x/week  PT DURATION: 8 weeks  PLANNED INTERVENTIONS: Therapeutic exercises, Therapeutic activity, Neuromuscular re-education, Patient/Family education, Self Care, Joint mobilization, Aquatic Therapy, Dry Needling, Spinal mobilization, Cryotherapy, Moist heat, scar mobilization, Taping, Biofeedback, Manual therapy, and Re-evaluation  PLAN FOR NEXT SESSION: strengthening of hips and core, stretching spine and hips, coordination of pelvic floor with activity, hip openers   Solon Palm, PT  09/10/245:03 PM   PHYSICAL THERAPY DISCHARGE SUMMARY  Visits from Start of Care: 4  Current functional level related to goals / functional outcomes: Unable to assess as pt did not return for f/u visits.    Remaining deficits: Unable to assess as pt did not return for f/u visits.    Education / Equipment: HEP   Patient agrees to discharge. Patient goals were not met. Patient is being discharged due to not returning since the last visit.  Solon Palm, PT 10/08/23 9:53 AM   [facility] [address] [place], [place] [address] Phone # [phone] Fax [phone]

## 2023-03-31 ENCOUNTER — Encounter: Payer: Self-pay | Admitting: Physical Therapy

## 2023-03-31 ENCOUNTER — Ambulatory Visit: Payer: BC Managed Care – PPO | Attending: Obstetrics and Gynecology | Admitting: Physical Therapy

## 2023-03-31 ENCOUNTER — Ambulatory Visit: Payer: BC Managed Care – PPO | Attending: Cardiology | Admitting: Cardiology

## 2023-03-31 ENCOUNTER — Encounter: Payer: Self-pay | Admitting: Cardiology

## 2023-03-31 VITALS — BP 105/75 | HR 88 | Ht 62.0 in | Wt 222.0 lb

## 2023-03-31 DIAGNOSIS — O26892 Other specified pregnancy related conditions, second trimester: Secondary | ICD-10-CM | POA: Diagnosis not present

## 2023-03-31 DIAGNOSIS — Z3A21 21 weeks gestation of pregnancy: Secondary | ICD-10-CM | POA: Diagnosis not present

## 2023-03-31 DIAGNOSIS — O10919 Unspecified pre-existing hypertension complicating pregnancy, unspecified trimester: Secondary | ICD-10-CM | POA: Diagnosis not present

## 2023-03-31 DIAGNOSIS — O09522 Supervision of elderly multigravida, second trimester: Secondary | ICD-10-CM

## 2023-03-31 DIAGNOSIS — R293 Abnormal posture: Secondary | ICD-10-CM

## 2023-03-31 DIAGNOSIS — N393 Stress incontinence (female) (male): Secondary | ICD-10-CM | POA: Diagnosis not present

## 2023-03-31 DIAGNOSIS — R102 Pelvic and perineal pain: Secondary | ICD-10-CM

## 2023-03-31 DIAGNOSIS — M62838 Other muscle spasm: Secondary | ICD-10-CM

## 2023-03-31 DIAGNOSIS — M6281 Muscle weakness (generalized): Secondary | ICD-10-CM

## 2023-03-31 DIAGNOSIS — R1084 Generalized abdominal pain: Secondary | ICD-10-CM

## 2023-03-31 DIAGNOSIS — Z3A24 24 weeks gestation of pregnancy: Secondary | ICD-10-CM | POA: Diagnosis not present

## 2023-03-31 DIAGNOSIS — R279 Unspecified lack of coordination: Secondary | ICD-10-CM

## 2023-03-31 DIAGNOSIS — O99210 Obesity complicating pregnancy, unspecified trimester: Secondary | ICD-10-CM

## 2023-03-31 NOTE — Progress Notes (Unsigned)
[facility]  {Choose New Eval or Follow Up Note:[phone]}   Prior CV Studies Reviewed: The following studies were reviewed today: ***  Past Medical History:  Diagnosis Date   Anxiety    Depression    Endometriosis    Fatty liver    Hypertension    Hypothyroidism    PCOS (polycystic ovarian syndrome)     Past Surgical History:  Procedure Laterality Date   DILATION AND CURETTAGE OF UTERUS  06/2021   LAPAROSCOPY     for endometriosis   NASAL SEPTUM SURGERY     OTHER SURGICAL HISTORY     fibroid removal   OTHER SURGICAL HISTORY     fistula drainage   { Click here to update PMH, PSH, OB Hx then refresh note  :1}   OB History     Gravida  3   Para      Term      Preterm      AB  2   Living         SAB  2   IAB      Ectopic      Multiple  0   Live Births              { Click here to update OB Charting then refresh note  :1}    Current Medications: Current Meds  Medication Sig   labetalol (NORMODYNE) 100 MG tablet Take 1 tablet (100 mg total) by mouth 2 (two) times daily. (Patient taking differently: Take 200 mg by mouth 2 (two) times daily.)   levothyroxine (SYNTHROID) 50 MCG tablet Take 1 tablet (50 mcg total) by mouth daily before breakfast.   mirtazapine (REMERON) 15 MG tablet Take 15 mg by mouth at bedtime.   sertraline (ZOLOFT) 100 MG tablet Take 125 mg by mouth daily.     Allergies:   Patient has no allergy information on record.   Social History   Socioeconomic History   Marital status: Married    Spouse name: Not on file   Number of children: Not on file   Years of education: Not on file   Highest education level: Some college, no degree  Occupational History   Not on file  Tobacco Use   Smoking status: Never    Passive exposure: Never   Smokeless tobacco: Never  Vaping Use   Vaping status: Never Used  Substance and Sexual Activity   Alcohol use: Never   Drug use: Never   Sexual activity: Not Currently     Partners: Male    Birth control/protection: None  Other Topics Concern   Not on file  Social History Narrative   Not on file   Social Determinants of Health   Financial Resource Strain: Low Risk  (11/03/2022)   Overall Financial Resource Strain (CARDIA)    Difficulty of Paying Living Expenses: Not hard at all  Food Insecurity: Patient Declined (11/03/2022)   Hunger Vital Sign    Worried About Running Out of Food in the Last Year: Patient declined    Ran Out of Food in the Last Year: Patient declined  Transportation Needs: Patient Declined (11/03/2022)   PRAPARE - Administrator, Civil Service (Medical): Patient declined    Lack of Transportation (Non-Medical): Patient declined  Physical Activity: Unknown (11/03/2022)   Exercise Vital Sign    Days of Exercise per Week: 0 days    Minutes of Exercise per Session: Not on file  Stress: No Stress Concern  Present (11/03/2022)   [employer] of Occupational Health - Occupational Stress Questionnaire    Feeling of Stress : Only a little  Social Connections: Moderately Integrated (11/03/2022)   Social Connection and Isolation Panel [NHANES]    Frequency of Communication with Friends and Family: More than three times a week    Frequency of Social Gatherings with Friends and Family: More than three times a week    Attends Religious Services: More than 4 times per year    Active Member of [employer] or Organizations: No    Attends Engineer, structural: Not on file    Marital Status: Married  { Click here to update SDOH then refresh :1}    Family History  Problem Relation Age of Onset   Hypertension Father    Depression Father    Hypertension Sister    Cancer Maternal Grandmother        breast   Hypertension Paternal Grandmother    { Click here to update FH then refresh note    :1}   ROS:   Please see the history of present illness.    *** All other systems reviewed and are negative.   Labs/EKG Reviewed:     EKG:   EKG is *** ordered today.  The ekg ordered today demonstrates ***  Recent Labs: 04/23/2022: Magnesium 1.8 11/04/2022: TSH 1.11 03/28/2023: ALT 21; BUN 8; Creatinine, Ser 0.69; Hemoglobin 11.7; Platelets 319; Potassium 3.9; Sodium 132   Recent Lipid Panel No results found for: "CHOL", "TRIG", "HDL", "CHOLHDL", "LDLCALC", "LDLDIRECT"  Physical Exam:    VS:  BP 105/75   Pulse 88   Ht 5\' 2"  (1.575 m)   Wt 222 lb (100.7 kg)   LMP 10/11/2022 (Approximate)   SpO2 98%   BMI 40.60 kg/m     Wt Readings from Last 3 Encounters:  03/31/23 222 lb (100.7 kg)  03/28/23 224 lb 12.8 oz (102 kg)  03/09/23 208 lb (94.3 kg)     GEN: *** Well nourished, well developed in no acute distress HEENT: Normal NECK: No JVD; No carotid bruits LYMPHATICS: No lymphadenopathy CARDIAC: ***RRR, no murmurs, rubs, gallops RESPIRATORY:  Clear to auscultation without rales, wheezing or rhonchi  ABDOMEN: Soft, non-tender, non-distended MUSCULOSKELETAL:  No edema; No deformity  SKIN: Warm and dry NEUROLOGIC:  Alert and oriented x 3 PSYCHIATRIC:  Normal affect    Risk Assessment/Risk Calculators:   { Click to calculate CARPREG II - THEN refresh note :1}    { Click to caclulate Mod WHO Class of CV Risk - THEN refresh note :1}     { Click for CHADS2VASc Score - THEN Refresh Note    :[id number]}      ASSESSMENT & PLAN:    *** There are no Patient Instructions on file for this visit.   Dispo:  No follow-ups on file.   Medication Adjustments/Labs and Tests Ordered: Current medicines are reviewed at length with the patient today.  Concerns regarding medicines are outlined above.  Tests Ordered: No orders of the defined types were placed in this encounter.  Medication Changes: No orders of the defined types were placed in this encounter.

## 2023-03-31 NOTE — Patient Instructions (Signed)
Medication Instructions:  Your physician recommends that you continue on your current medications as directed. Please refer to the Current Medication list given to you today.    *If you need a refill on your cardiac medications before your next appointment, please call your pharmacy*   Follow-Up: At [facility], you and your health needs are our priority.  As part of our continuing mission to provide you with exceptional heart care, we have created designated Provider Care Teams.  These Care Teams include your primary Cardiologist (physician) and Advanced Practice Providers (APPs -  Physician Assistants and Nurse Practitioners) who all work together to provide you with the care you need, when you need it.  We recommend signing up for the patient portal called "MyChart".  Sign up information is provided on this After Visit Summary.  MyChart is used to connect with patients for Virtual Visits (Telemedicine).  Patients are able to view lab/test results, encounter notes, upcoming appointments, etc.  Non-urgent messages can be sent to your provider as well.   To learn more about what you can do with MyChart, go to [email or web address].    Your next appointment:   2 month(s)  The format for your next appointment:   In Person  Provider:   Maisie Fus, MD

## 2023-04-01 DIAGNOSIS — F53 Postpartum depression: Secondary | ICD-10-CM | POA: Diagnosis not present

## 2023-04-02 ENCOUNTER — Ambulatory Visit: Payer: BC Managed Care – PPO | Admitting: Physical Therapy

## 2023-04-06 ENCOUNTER — Ambulatory Visit: Payer: BC Managed Care – PPO | Admitting: Physical Therapy

## 2023-04-09 ENCOUNTER — Encounter: Payer: BC Managed Care – PPO | Admitting: Physical Therapy

## 2023-04-09 DIAGNOSIS — F53 Postpartum depression: Secondary | ICD-10-CM | POA: Diagnosis not present

## 2023-04-13 DIAGNOSIS — F331 Major depressive disorder, recurrent, moderate: Secondary | ICD-10-CM | POA: Diagnosis not present

## 2023-04-13 DIAGNOSIS — F410 Panic disorder [episodic paroxysmal anxiety] without agoraphobia: Secondary | ICD-10-CM | POA: Diagnosis not present

## 2023-04-14 ENCOUNTER — Ambulatory Visit: Payer: BC Managed Care – PPO | Admitting: Physical Therapy

## 2023-04-14 ENCOUNTER — Telehealth: Payer: Self-pay | Admitting: Physical Therapy

## 2023-04-14 NOTE — Telephone Encounter (Signed)
PT called pt about this morning's appointment at 1145. Pt did not answer, voicemail left.  Pt has missed or cancelled the last 3 appointments, today being 4. PT asked pt to call clinic about continued need for PT and future appointments.   Otelia Sergeant, PT, DPT 04/14/2411:26 PM

## 2023-04-16 ENCOUNTER — Encounter: Payer: BC Managed Care – PPO | Admitting: Physical Therapy

## 2023-04-16 DIAGNOSIS — F53 Postpartum depression: Secondary | ICD-10-CM | POA: Diagnosis not present

## 2023-04-27 DIAGNOSIS — Z3402 Encounter for supervision of normal first pregnancy, second trimester: Secondary | ICD-10-CM | POA: Diagnosis not present

## 2023-04-27 DIAGNOSIS — Z3A28 28 weeks gestation of pregnancy: Secondary | ICD-10-CM | POA: Diagnosis not present

## 2023-04-27 DIAGNOSIS — O09523 Supervision of elderly multigravida, third trimester: Secondary | ICD-10-CM | POA: Diagnosis not present

## 2023-04-28 DIAGNOSIS — F53 Postpartum depression: Secondary | ICD-10-CM | POA: Diagnosis not present

## 2023-05-01 ENCOUNTER — Ambulatory Visit (INDEPENDENT_AMBULATORY_CARE_PROVIDER_SITE_OTHER): Payer: BC Managed Care – PPO | Admitting: Family Medicine

## 2023-05-01 ENCOUNTER — Encounter: Payer: Self-pay | Admitting: Family Medicine

## 2023-05-01 VITALS — BP 122/86 | HR 92 | Temp 97.7°F | Ht 62.0 in | Wt 242.0 lb

## 2023-05-01 DIAGNOSIS — R051 Acute cough: Secondary | ICD-10-CM | POA: Diagnosis not present

## 2023-05-01 DIAGNOSIS — J3489 Other specified disorders of nose and nasal sinuses: Secondary | ICD-10-CM | POA: Diagnosis not present

## 2023-05-01 DIAGNOSIS — R0982 Postnasal drip: Secondary | ICD-10-CM

## 2023-05-01 DIAGNOSIS — J069 Acute upper respiratory infection, unspecified: Secondary | ICD-10-CM | POA: Diagnosis not present

## 2023-05-01 DIAGNOSIS — Z3A28 28 weeks gestation of pregnancy: Secondary | ICD-10-CM

## 2023-05-01 LAB — POCT RAPID STREP A (OFFICE): Rapid Strep A Screen: NEGATIVE

## 2023-05-01 LAB — POC COVID19 BINAXNOW: SARS Coronavirus 2 Ag: NEGATIVE

## 2023-05-01 NOTE — Patient Instructions (Signed)
Treat your symptoms as recommended by your OB office for over the counter medications.   Follow up if you are getting worse.

## 2023-05-01 NOTE — Progress Notes (Signed)
Subjective:  Alexandra Escobar is a 42 y.o. female who presents for a 4 day hx of  rhinorrhea, post nasal drainage, scratchy throat, bilateral ear discomfort, mild cough with congestion.   Denies fever, chills, dizziness, headache, chest pain, palpitations, shortness of breath, abdominal pain, N/V/D, urinary symptoms.   She is [redacted] wks pregnant. States she has not taken anything at home for her symptoms.   States she called her OB and was advised to see her PCP.   ROS as in subjective.   Objective: Vitals:   05/01/23 0929 05/01/23 0958  BP: (!) 140/96 122/86  Pulse: 92   Temp: 97.7 F (36.5 C)   SpO2: 97%     General appearance: Alert, WD/WN, no distress                             Skin: warm, no rash                           Head: no sinus tenderness                            Eyes: conjunctiva normal, corneas clear, PERRLA                            Ears: pearly TMs, external ear canals normal                          Nose: septum midline, turbinates swollen, with erythema and clear discharge             Mouth/throat: MMM, tongue normal, mild pharyngeal erythema                           Neck: supple, no adenopathy, mild left cervical lymph node TTP                          Heart: RRR                         Lungs: CTA bilaterally, no wheezes, rales, or rhonchi      Assessment: Viral URI  Acute cough - Plan: POC COVID-19 BinaxNow, POCT rapid strep A  Post-nasal drainage - Plan: POCT rapid strep A  Rhinorrhea - Plan: POCT rapid strep A  [redacted] weeks gestation of pregnancy   Plan: Covid test is negative.  Strep test is negative.  She is aware her BP is elevated and has not taken her medication yet today.  Suspect viral etiology. No acute distress. No red flag symptoms.  She will do symptomatic treatment with recommendations from [facility] for her illness.  Follow up if getting worse.

## 2023-05-06 DIAGNOSIS — F53 Postpartum depression: Secondary | ICD-10-CM | POA: Diagnosis not present

## 2023-05-11 DIAGNOSIS — F410 Panic disorder [episodic paroxysmal anxiety] without agoraphobia: Secondary | ICD-10-CM | POA: Diagnosis not present

## 2023-05-11 DIAGNOSIS — F331 Major depressive disorder, recurrent, moderate: Secondary | ICD-10-CM | POA: Diagnosis not present

## 2023-05-14 DIAGNOSIS — F53 Postpartum depression: Secondary | ICD-10-CM | POA: Diagnosis not present

## 2023-05-15 ENCOUNTER — Encounter (HOSPITAL_COMMUNITY): Payer: Self-pay | Admitting: Obstetrics and Gynecology

## 2023-05-15 ENCOUNTER — Inpatient Hospital Stay (HOSPITAL_COMMUNITY)
Admission: AD | Admit: 2023-05-15 | Discharge: 2023-05-15 | Disposition: A | Discharge status: Home / Self Care | Payer: BC Managed Care – PPO | Attending: Obstetrics and Gynecology | Admitting: Obstetrics and Gynecology

## 2023-05-15 ENCOUNTER — Inpatient Hospital Stay (HOSPITAL_COMMUNITY): Payer: BC Managed Care – PPO

## 2023-05-15 DIAGNOSIS — O99283 Endocrine, nutritional and metabolic diseases complicating pregnancy, third trimester: Secondary | ICD-10-CM | POA: Insufficient documentation

## 2023-05-15 DIAGNOSIS — O99513 Diseases of the respiratory system complicating pregnancy, third trimester: Secondary | ICD-10-CM | POA: Insufficient documentation

## 2023-05-15 DIAGNOSIS — J069 Acute upper respiratory infection, unspecified: Secondary | ICD-10-CM | POA: Diagnosis not present

## 2023-05-15 DIAGNOSIS — R519 Headache, unspecified: Secondary | ICD-10-CM | POA: Insufficient documentation

## 2023-05-15 DIAGNOSIS — F53 Postpartum depression: Secondary | ICD-10-CM | POA: Diagnosis not present

## 2023-05-15 DIAGNOSIS — F419 Anxiety disorder, unspecified: Secondary | ICD-10-CM | POA: Diagnosis not present

## 2023-05-15 DIAGNOSIS — Z1152 Encounter for screening for COVID-19: Secondary | ICD-10-CM | POA: Insufficient documentation

## 2023-05-15 DIAGNOSIS — R0602 Shortness of breath: Secondary | ICD-10-CM | POA: Diagnosis not present

## 2023-05-15 DIAGNOSIS — R059 Cough, unspecified: Secondary | ICD-10-CM | POA: Insufficient documentation

## 2023-05-15 DIAGNOSIS — O26893 Other specified pregnancy related conditions, third trimester: Secondary | ICD-10-CM

## 2023-05-15 DIAGNOSIS — O10013 Pre-existing essential hypertension complicating pregnancy, third trimester: Secondary | ICD-10-CM | POA: Insufficient documentation

## 2023-05-15 DIAGNOSIS — R079 Chest pain, unspecified: Secondary | ICD-10-CM | POA: Diagnosis not present

## 2023-05-15 DIAGNOSIS — E039 Hypothyroidism, unspecified: Secondary | ICD-10-CM | POA: Diagnosis not present

## 2023-05-15 DIAGNOSIS — Z3A30 30 weeks gestation of pregnancy: Secondary | ICD-10-CM | POA: Insufficient documentation

## 2023-05-15 DIAGNOSIS — R062 Wheezing: Secondary | ICD-10-CM

## 2023-05-15 DIAGNOSIS — R0981 Nasal congestion: Secondary | ICD-10-CM | POA: Diagnosis not present

## 2023-05-15 DIAGNOSIS — R0789 Other chest pain: Secondary | ICD-10-CM | POA: Diagnosis not present

## 2023-05-15 DIAGNOSIS — Z3A31 31 weeks gestation of pregnancy: Secondary | ICD-10-CM | POA: Diagnosis not present

## 2023-05-15 DIAGNOSIS — R071 Chest pain on breathing: Secondary | ICD-10-CM | POA: Diagnosis not present

## 2023-05-15 DIAGNOSIS — O10913 Unspecified pre-existing hypertension complicating pregnancy, third trimester: Secondary | ICD-10-CM | POA: Diagnosis not present

## 2023-05-15 LAB — CBC
HCT: 33.4 % — ABNORMAL LOW (ref 36.0–46.0)
Hemoglobin: 11.3 g/dL — ABNORMAL LOW (ref 12.0–15.0)
MCH: 31.9 pg (ref 26.0–34.0)
MCHC: 33.8 g/dL (ref 30.0–36.0)
MCV: 94.4 fL (ref 80.0–100.0)
Platelets: 341 10*3/uL (ref 150–400)
RBC: 3.54 MIL/uL — ABNORMAL LOW (ref 3.87–5.11)
RDW: 14.1 % (ref 11.5–15.5)
WBC: 17.6 10*3/uL — ABNORMAL HIGH (ref 4.0–10.5)
nRBC: 0 % (ref 0.0–0.2)

## 2023-05-15 LAB — RESP PANEL BY RT-PCR (RSV, FLU A&B, COVID)  RVPGX2
Influenza A by PCR: NEGATIVE
Influenza B by PCR: NEGATIVE
Resp Syncytial Virus by PCR: NEGATIVE
SARS Coronavirus 2 by RT PCR: NEGATIVE

## 2023-05-15 LAB — URINALYSIS, ROUTINE W REFLEX MICROSCOPIC
Bilirubin Urine: NEGATIVE
Glucose, UA: NEGATIVE mg/dL
Hgb urine dipstick: NEGATIVE
Ketones, ur: NEGATIVE mg/dL
Leukocytes,Ua: NEGATIVE
Nitrite: NEGATIVE
Protein, ur: NEGATIVE mg/dL
Specific Gravity, Urine: 1.018 (ref 1.005–1.030)
pH: 5 (ref 5.0–8.0)

## 2023-05-15 LAB — BASIC METABOLIC PANEL
Anion gap: 12 (ref 5–15)
BUN: 11 mg/dL (ref 6–20)
CO2: 18 mmol/L — ABNORMAL LOW (ref 22–32)
Calcium: 8.6 mg/dL — ABNORMAL LOW (ref 8.9–10.3)
Chloride: 105 mmol/L (ref 98–111)
Creatinine, Ser: 0.7 mg/dL (ref 0.44–1.00)
GFR, Estimated: >60 mL/min (ref >60)
Glucose, Bld: 103 mg/dL — ABNORMAL HIGH (ref 70–99)
Potassium: 4.1 mmol/L (ref 3.5–5.1)
Sodium: 135 mmol/L (ref 135–145)

## 2023-05-15 LAB — TROPONIN I (HIGH SENSITIVITY): Troponin I (High Sensitivity): 16 ng/L (ref <18)

## 2023-05-15 MED ORDER — ACETAMINOPHEN-CAFFEINE 500-65 MG PO TABS
2.0000 | ORAL_TABLET | Freq: Once | ORAL | Status: AC
  Administered 2023-05-15: 2 via ORAL
  Filled 2023-05-15: qty 2

## 2023-05-15 MED ORDER — LORATADINE 10 MG PO TABS
10.0000 mg | ORAL_TABLET | Freq: Every day | ORAL | 0 refills | Status: DC
Start: 2023-05-15 — End: 2023-06-16

## 2023-05-15 MED ORDER — ALBUTEROL SULFATE (2.5 MG/3ML) 0.083% IN NEBU
2.5000 mg | INHALATION_SOLUTION | Freq: Once | RESPIRATORY_TRACT | Status: AC
  Administered 2023-05-15: 2.5 mg via RESPIRATORY_TRACT
  Filled 2023-05-15: qty 3

## 2023-05-15 MED ORDER — AZITHROMYCIN 250 MG PO TABS
250.0000 mg | ORAL_TABLET | Freq: Every day | ORAL | 0 refills | Status: DC
Start: 2023-05-15 — End: 2023-06-04

## 2023-05-15 MED ORDER — FLUTICASONE PROPIONATE 50 MCG/ACT NA SUSP
2.0000 | Freq: Once | NASAL | Status: AC
  Administered 2023-05-15: 2 via NASAL
  Filled 2023-05-15: qty 16

## 2023-05-15 MED ORDER — LORATADINE 10 MG PO TABS
10.0000 mg | ORAL_TABLET | Freq: Once | ORAL | Status: AC
  Administered 2023-05-15: 10 mg via ORAL
  Filled 2023-05-15: qty 1

## 2023-05-15 NOTE — MAU Note (Addendum)
.  Sunnie Chladek is a 42 y.o. at [redacted]w[redacted]d here in MAU reporting nasal congestion for 3-4wks.  Some wheezing noted by patient. No hx asthma. Has a bad h/a for 3 hours. Sore throat. Some lower abd cramping. When she lays down has SOB and chest pain. Reports good FM and denies LOF or VB. Some pelvic pressure. Saw PCP last wk for sx and covid test negative. Pt states no meds given by PCP. Has not taken any meds at home for h/a  Onset of complaint: 3-4wks Pain score: 9 for h/a, 5 for lower abd cramping Vitals:   05/15/23 0248 05/15/23 0250  BP:  (!) 147/94  Pulse: 86   Resp: 18   Temp: 98 F (36.7 C)   SpO2: 99%      FHT:125 Lab orders placed from triage:  u/a

## 2023-05-15 NOTE — MAU Provider Note (Signed)
Chief Complaint:  Shortness of Breath and Chest Pain   Event Date/Time   First Provider Initiated Contact with Patient 05/15/23 0317     HPI: Alexandra Escobar is a 42 y.o. G3P0020 at 80w6dwho presents to maternity admissions reporting 3-4 week history of URI symptoms, nasal congestion, headache, chest pressure, wheezing, sore throat and intermittent nosebleed. .Tested negative for Covid. Was recommended to take Claritin and mucinex but did not take them Did not take Tylenol for headache.  Does not know why she did not.  She reports good fetal movement, denies LOF, vaginal bleeding, urinary symptoms, n/v, diarrhea, constipation or fever/chills.  Has a history of Hypertension and Hyperthyroidism.   Shortness of Breath This is a recurrent problem. The current episode started 1 to 4 weeks ago. Associated symptoms include rhinorrhea, a sore throat and wheezing. The patient has no known risk factors for DVT/PE. She has tried nothing for the symptoms.   RN Note: Alexandra Escobar is a 42 y.o. at [redacted]w[redacted]d here in MAU reporting nasal congestion for 3-4wks.  Some wheezing noted by patient. No hx asthma. Has a bad h/a for 3 hours. Sore throat. Some lower abd cramping. When she lays down has SOB and chest pain. Reports good FM and denies LOF or VB. Some pelvic pressure. Saw PCP last wk for sx and covid test negative. Pt states no meds given by PCP. Has not taken any meds at home for h/a   Onset of complaint: 3-4wks  Past Medical History: Past Medical History:  Diagnosis Date   Anxiety    Depression    Endometriosis    Fatty liver    Hypertension    Hypothyroidism    PCOS (polycystic ovarian syndrome)     Past obstetric history: OB History  Gravida Para Term Preterm AB Living  3       2    SAB IAB Ectopic Multiple Live Births  2     0      # Outcome Date GA Lbr Len/2nd Weight Sex Type Anes PTL Lv  3 Current           2 SAB           1 SAB      SAB       Past Surgical History: Past Surgical History:   Procedure Laterality Date   DILATION AND CURETTAGE OF UTERUS  06/2021   LAPAROSCOPY     for endometriosis   NASAL SEPTUM SURGERY     OTHER SURGICAL HISTORY     fibroid removal   OTHER SURGICAL HISTORY     fistula drainage    Family History: Family History  Problem Relation Age of Onset   Hypertension Father    Depression Father    Hypertension Sister    Cancer Maternal Grandmother        breast   Hypertension Paternal Grandmother     Social History: Social History   Tobacco Use   Smoking status: Never    Passive exposure: Never   Smokeless tobacco: Never  Vaping Use   Vaping status: Never Used  Substance Use Topics   Alcohol use: Never   Drug use: Never    Allergies: No Known Allergies  Meds:  Medications Prior to Admission  Medication Sig Dispense Refill Last Dose   labetalol (NORMODYNE) 100 MG tablet Take 1 tablet (100 mg total) by mouth 2 (two) times daily. (Patient taking differently: Take 200 mg by mouth 2 (two) times daily.) 180 tablet 1  05/14/2023 at 2100   levothyroxine (SYNTHROID) 50 MCG tablet Take 1 tablet (50 mcg total) by mouth daily before breakfast. 90 tablet 1 05/14/2023 at 0800   mirtazapine (REMERON) 15 MG tablet Take 15 mg by mouth at bedtime.   Past Week   sertraline (ZOLOFT) 100 MG tablet Take 125 mg by mouth daily.   05/14/2023 at 2200    I have reviewed patient's Past Medical Hx, Surgical Hx, Family Hx, Social Hx, medications and allergies.   ROS:  Review of Systems  HENT:  Positive for rhinorrhea and sore throat.   Respiratory:  Positive for shortness of breath and wheezing.    Other systems negative  Physical Exam  Patient Vitals for the past 24 hrs:  BP Temp Pulse Resp SpO2 Height Weight  05/15/23 0314 (!) 143/89 -- 87 -- 98 % -- --  05/15/23 0250 (!) 147/94 -- -- -- -- -- --  05/15/23 0248 -- 98 F (36.7 C) 86 18 99 % 5\' 2"  (1.575 m) 112.9 kg   Constitutional: Well-developed, well-nourished female in no acute distress.   HEENT:  rhinorrhea, coughing Cardiovascular: normal rate and rhythm Respiratory: normal effort, clear to auscultation bilaterally with intermittent expiratory wheezes GI: Abd soft, non-tender, gravid appropriate for gestational age.   No rebound or guarding. MS: Extremities nontender, no edema, normal ROM Neurologic: Alert and oriented x 4.  GU: Neg CVAT.   FHT:  Baseline 135 , moderate variability, accelerations present, no decelerations Contractions: Occasional    Labs: Results for orders placed or performed during the hospital encounter of 05/15/23 (from the past 24 hour(s))  Resp panel by RT-PCR (RSV, Flu A&B, Covid) Anterior Nasal Swab     Status: None   Collection Time: 05/15/23  3:00 AM   Specimen: Anterior Nasal Swab  Result Value Ref Range   SARS Coronavirus 2 by RT PCR NEGATIVE NEGATIVE   Influenza A by PCR NEGATIVE NEGATIVE   Influenza B by PCR NEGATIVE NEGATIVE   Resp Syncytial Virus by PCR NEGATIVE NEGATIVE  Urinalysis, Routine w reflex microscopic -Urine, Clean Catch     Status: Abnormal   Collection Time: 05/15/23  3:00 AM  Result Value Ref Range   Color, Urine YELLOW YELLOW   APPearance HAZY (A) CLEAR   Specific Gravity, Urine 1.018 1.005 - 1.030   pH 5.0 5.0 - 8.0   Glucose, UA NEGATIVE NEGATIVE mg/dL   Hgb urine dipstick NEGATIVE NEGATIVE   Bilirubin Urine NEGATIVE NEGATIVE   Ketones, ur NEGATIVE NEGATIVE mg/dL   Protein, ur NEGATIVE NEGATIVE mg/dL   Nitrite NEGATIVE NEGATIVE   Leukocytes,Ua NEGATIVE NEGATIVE  CBC     Status: Abnormal   Collection Time: 05/15/23  3:56 AM  Result Value Ref Range   WBC 17.6 (H) 4.0 - 10.5 K/uL   RBC 3.54 (L) 3.87 - 5.11 MIL/uL   Hemoglobin 11.3 (L) 12.0 - 15.0 g/dL   HCT 66.4 (L) 40.3 - 47.4 %   MCV 94.4 80.0 - 100.0 fL   MCH 31.9 26.0 - 34.0 pg   MCHC 33.8 30.0 - 36.0 g/dL   RDW 25.9 56.3 - 87.5 %   Platelets 341 150 - 400 K/uL   nRBC 0.0 0.0 - 0.2 %  Basic metabolic panel     Status: Abnormal   Collection Time:  05/15/23  3:56 AM  Result Value Ref Range   Sodium 135 135 - 145 mmol/L   Potassium 4.1 3.5 - 5.1 mmol/L   Chloride 105 98 - 111 mmol/L  CO2 18 (L) 22 - 32 mmol/L   Glucose, Bld 103 (H) 70 - 99 mg/dL   BUN 11 6 - 20 mg/dL   Creatinine, Ser 1.61 0.44 - 1.00 mg/dL   Calcium 8.6 (L) 8.9 - 10.3 mg/dL   GFR, Estimated >09 >60 mL/min   Anion gap 12 5 - 15  Troponin I (High Sensitivity)     Status: None   Collection Time: 05/15/23  3:56 AM  Result Value Ref Range   Troponin I (High Sensitivity) 16 <18 ng/L       Imaging:  DG Chest Port 1 View  Result Date: 05/15/2023 CLINICAL DATA:  Shortness of breath, cough, chest pain, and wheezing. EXAM: PORTABLE CHEST 1 VIEW COMPARISON:  04/21/2022. FINDINGS: The heart size and mediastinal contours are within normal limits. Both lungs are clear. No acute osseous abnormality. IMPRESSION: No active disease. Electronically Signed   By: Thornell Sartorius M.D.   On: 05/15/2023 04:45     MAU Course/MDM: I have reviewed the triage vital signs and the nursing notes.   Pertinent labs & imaging results that were available during my care of the patient were reviewed by me and considered in my medical decision making (see chart for details).      I have reviewed her medical records including past results, notes and treatments.   I have ordered labs and reviewed results. These are normal except for some leukocytosis.  CXR is clear. NST reviewed Consult Dr Despina Hidden with presentation, exam findings and test results.  Treatments in MAU included Albuterol nebulizer treatment, EFM  We gave her Flonase for congestion, Excedrin Tension for headache. .Claritin also given  She got moderate relief from these interventions    Assessment: Single IUP at [redacted]w[redacted]d Upper Respiratory Infection, prolonged Suspect Allergic component Cough Headache  Plan: Discharge home Rx Claritin daily Rx Flonase daily prn Rx Z Pack for possible sinusitis Labor precautions and fetal kick  counts Follow up in Office for prenatal visits and recheck Encouraged to return if she develops worsening of symptoms, increase in pain, fever, or other concerning symptoms.   Pt stable at time of discharge.  Wynelle Bourgeois CNM, MSN Certified Nurse-Midwife 05/15/2023 3:17 AM

## 2023-05-16 ENCOUNTER — Encounter (HOSPITAL_COMMUNITY): Payer: Self-pay | Admitting: Obstetrics and Gynecology

## 2023-05-16 ENCOUNTER — Inpatient Hospital Stay (HOSPITAL_COMMUNITY)
Admission: AD | Admit: 2023-05-16 | Discharge: 2023-05-17 | Disposition: A | Discharge status: Home / Self Care | Payer: BC Managed Care – PPO | Attending: Obstetrics and Gynecology | Admitting: Obstetrics and Gynecology

## 2023-05-16 ENCOUNTER — Other Ambulatory Visit: Payer: Self-pay

## 2023-05-16 DIAGNOSIS — O10913 Unspecified pre-existing hypertension complicating pregnancy, third trimester: Secondary | ICD-10-CM | POA: Diagnosis not present

## 2023-05-16 DIAGNOSIS — O26893 Other specified pregnancy related conditions, third trimester: Secondary | ICD-10-CM | POA: Insufficient documentation

## 2023-05-16 DIAGNOSIS — Z79899 Other long term (current) drug therapy: Secondary | ICD-10-CM | POA: Diagnosis not present

## 2023-05-16 DIAGNOSIS — O09523 Supervision of elderly multigravida, third trimester: Secondary | ICD-10-CM | POA: Diagnosis not present

## 2023-05-16 DIAGNOSIS — R0602 Shortness of breath: Secondary | ICD-10-CM | POA: Insufficient documentation

## 2023-05-16 DIAGNOSIS — O99343 Other mental disorders complicating pregnancy, third trimester: Secondary | ICD-10-CM | POA: Diagnosis not present

## 2023-05-16 DIAGNOSIS — I10 Essential (primary) hypertension: Secondary | ICD-10-CM

## 2023-05-16 DIAGNOSIS — F419 Anxiety disorder, unspecified: Secondary | ICD-10-CM | POA: Insufficient documentation

## 2023-05-16 DIAGNOSIS — R071 Chest pain on breathing: Secondary | ICD-10-CM | POA: Insufficient documentation

## 2023-05-16 DIAGNOSIS — O99513 Diseases of the respiratory system complicating pregnancy, third trimester: Secondary | ICD-10-CM | POA: Diagnosis not present

## 2023-05-16 DIAGNOSIS — Z3A31 31 weeks gestation of pregnancy: Secondary | ICD-10-CM | POA: Diagnosis not present

## 2023-05-16 DIAGNOSIS — J069 Acute upper respiratory infection, unspecified: Secondary | ICD-10-CM | POA: Insufficient documentation

## 2023-05-16 LAB — COMPREHENSIVE METABOLIC PANEL
ALT: 22 U/L (ref 0–44)
AST: 26 U/L (ref 15–41)
Albumin: 2.5 g/dL — ABNORMAL LOW (ref 3.5–5.0)
Alkaline Phosphatase: 79 U/L (ref 38–126)
Anion gap: 9 (ref 5–15)
BUN: 10 mg/dL (ref 6–20)
CO2: 21 mmol/L — ABNORMAL LOW (ref 22–32)
Calcium: 9 mg/dL (ref 8.9–10.3)
Chloride: 107 mmol/L (ref 98–111)
Creatinine, Ser: 0.58 mg/dL (ref 0.44–1.00)
GFR, Estimated: >60 mL/min (ref >60)
Glucose, Bld: 116 mg/dL — ABNORMAL HIGH (ref 70–99)
Potassium: 4 mmol/L (ref 3.5–5.1)
Sodium: 137 mmol/L (ref 135–145)
Total Bilirubin: 0.4 mg/dL (ref 0.3–1.2)
Total Protein: 5.8 g/dL — ABNORMAL LOW (ref 6.5–8.1)

## 2023-05-16 LAB — CBC
HCT: 34.8 % — ABNORMAL LOW (ref 36.0–46.0)
Hemoglobin: 12 g/dL (ref 12.0–15.0)
MCH: 32.8 pg (ref 26.0–34.0)
MCHC: 34.5 g/dL (ref 30.0–36.0)
MCV: 95.1 fL (ref 80.0–100.0)
Platelets: 340 10*3/uL (ref 150–400)
RBC: 3.66 MIL/uL — ABNORMAL LOW (ref 3.87–5.11)
RDW: 14 % (ref 11.5–15.5)
WBC: 15.1 10*3/uL — ABNORMAL HIGH (ref 4.0–10.5)
nRBC: 0 % (ref 0.0–0.2)

## 2023-05-16 LAB — PROTEIN / CREATININE RATIO, URINE
Creatinine, Urine: 93 mg/dL
Protein Creatinine Ratio: 0.09 mg/mg{creat} (ref 0.00–0.15)
Total Protein, Urine: 8 mg/dL

## 2023-05-16 LAB — URINALYSIS, ROUTINE W REFLEX MICROSCOPIC
Bilirubin Urine: NEGATIVE
Glucose, UA: NEGATIVE mg/dL
Hgb urine dipstick: NEGATIVE
Ketones, ur: NEGATIVE mg/dL
Leukocytes,Ua: NEGATIVE
Nitrite: NEGATIVE
Protein, ur: NEGATIVE mg/dL
Specific Gravity, Urine: 1.017 (ref 1.005–1.030)
pH: 5 (ref 5.0–8.0)

## 2023-05-16 LAB — TROPONIN I (HIGH SENSITIVITY): Troponin I (High Sensitivity): 11 ng/L (ref <18)

## 2023-05-16 MED ORDER — GUAIFENESIN ER 600 MG PO TB12
1200.0000 mg | ORAL_TABLET | Freq: Once | ORAL | Status: AC
  Administered 2023-05-16: 1200 mg via ORAL
  Filled 2023-05-16: qty 2

## 2023-05-16 MED ORDER — LABETALOL HCL 100 MG PO TABS: 300.0000 mg | ORAL_TABLET | Freq: Three times a day (TID) | ORAL | 1 refills | Status: DC

## 2023-05-16 MED ORDER — ALBUTEROL SULFATE (2.5 MG/3ML) 0.083% IN NEBU
2.5000 mg | INHALATION_SOLUTION | Freq: Once | RESPIRATORY_TRACT | Status: AC
  Administered 2023-05-16: 2.5 mg via RESPIRATORY_TRACT
  Filled 2023-05-16: qty 3

## 2023-05-16 NOTE — Discharge Instructions (Signed)
Discuss the following anxiety medications with your provider:  Buspar (buspirone), Vistaril (hydroxyzine).

## 2023-05-16 NOTE — MAU Provider Note (Signed)
Chief Complaint:  Hypertension and Chest Pain   Event Date/Time   First Provider Initiated Contact with Patient 05/16/23 2132      HPI: Alexandra Escobar is a 42 y.o. G3P0020 at [redacted]w[redacted]d with CHTN on labetalol who presents to maternity admissions reporting chest pain with onset today. She was seen in MAU yesterday, 05/15/23, with URI and chest tightness with congestion x 2 weeks but pressure in her upper chest, middle and left side, and some radiation into her left shoulder started today. She reports she has this symptom when her blood pressure is elevated and BPs at home today were 150s/100s.  She denies h/a, epigastric pain, or visual disturbances. There are no contractions and baby is moving normally per pt.  There is some shortness of breath and wheezing when she has been lying down.  She started Claritin and Flonase, prescribed by CNM yesterday but just picked up her azithromycin tonight and has not taken it.  The congestion is slightly better after the meds but the chest pressure increased.     HPI  Past Medical History: Past Medical History:  Diagnosis Date   Anxiety    Depression    Endometriosis    Fatty liver    Hypertension    Hypothyroidism    PCOS (polycystic ovarian syndrome)     Past obstetric history: OB History  Gravida Para Term Preterm AB Living  3       2    SAB IAB Ectopic Multiple Live Births  2     0      # Outcome Date GA Lbr Len/2nd Weight Sex Type Anes PTL Lv  3 Current           2 SAB           1 SAB      SAB       Past Surgical History: Past Surgical History:  Procedure Laterality Date   DILATION AND CURETTAGE OF UTERUS  06/2021   LAPAROSCOPY     for endometriosis   NASAL SEPTUM SURGERY     OTHER SURGICAL HISTORY     fibroid removal   OTHER SURGICAL HISTORY     fistula drainage    Family History: Family History  Problem Relation Age of Onset   Hypertension Father    Depression Father    Hypertension Sister    Cancer Maternal Grandmother         breast   Hypertension Paternal Grandmother     Social History: Social History   Tobacco Use   Smoking status: Never    Passive exposure: Never   Smokeless tobacco: Never  Vaping Use   Vaping status: Never Used  Substance Use Topics   Alcohol use: Never   Drug use: Never    Allergies: No Known Allergies  Meds:  Medications Prior to Admission  Medication Sig Dispense Refill Last Dose   azithromycin (ZITHROMAX Z-PAK) 250 MG tablet Take 1 tablet (250 mg total) by mouth daily. 6 tablet 0    levothyroxine (SYNTHROID) 50 MCG tablet Take 1 tablet (50 mcg total) by mouth daily before breakfast. 90 tablet 1    loratadine (CLARITIN) 10 MG tablet Take 1 tablet (10 mg total) by mouth daily. 30 tablet 0    mirtazapine (REMERON) 15 MG tablet Take 15 mg by mouth at bedtime.      sertraline (ZOLOFT) 100 MG tablet Take 125 mg by mouth daily.      [DISCONTINUED] labetalol (NORMODYNE) 100 MG tablet  Take 1 tablet (100 mg total) by mouth 2 (two) times daily. (Patient taking differently: Take 200 mg by mouth 2 (two) times daily.) 180 tablet 1     ROS:  Review of Systems  Constitutional:  Negative for chills, fatigue and fever.  HENT:  Positive for congestion, postnasal drip and sore throat.   Eyes:  Negative for visual disturbance.  Respiratory:  Positive for wheezing. Negative for shortness of breath.   Cardiovascular:  Positive for chest pain.  Gastrointestinal:  Negative for abdominal pain, nausea and vomiting.  Genitourinary:  Negative for difficulty urinating, dysuria, flank pain, pelvic pain, vaginal bleeding, vaginal discharge and vaginal pain.  Neurological:  Negative for dizziness and headaches.  Psychiatric/Behavioral: Negative.       I have reviewed patient's Past Medical Hx, Surgical Hx, Family Hx, Social Hx, medications and allergies.   Physical Exam  Patient Vitals for the past 24 hrs:  BP Temp Temp src Pulse Resp SpO2 Height Weight  05/16/23 2207 -- -- -- 91 16 99 % -- --   05/16/23 2145 (!) 148/92 -- -- 91 -- -- -- --  05/16/23 2133 129/83 -- -- 85 -- -- -- --  05/16/23 2117 (!) 141/81 -- -- 87 -- -- -- --  05/16/23 2059 (!) 144/93 98.3 F (36.8 C) Oral 82 18 98 % -- --  05/16/23 2026 (!) 148/91 98.4 F (36.9 C) Oral 86 17 99 % 5\' 2"  (1.575 m) 112.6 kg   Constitutional: Well-developed, well-nourished female in no acute distress.  HEART: normal rate, heart sounds, regular rhythm RESP: normal effort, lung sounds clear and equal bilaterally  GI: Abd soft, non-tender, gravid appropriate for gestational age.  MS: Extremities nontender, no edema, normal ROM Neurologic: Alert and oriented x 4.  GU: Neg CVAT.  PELVIC EXAM: Deferred     FHT:  Baseline 135 , moderate variability, accelerations present, no decelerations Contractions: none on toco or to palpation   Labs: Results for orders placed or performed during the hospital encounter of 05/16/23 (from the past 24 hour(s))  Urinalysis, Routine w reflex microscopic -Urine, Clean Catch     Status: Abnormal   Collection Time: 05/16/23  9:01 PM  Result Value Ref Range   Color, Urine YELLOW YELLOW   APPearance HAZY (A) CLEAR   Specific Gravity, Urine 1.017 1.005 - 1.030   pH 5.0 5.0 - 8.0   Glucose, UA NEGATIVE NEGATIVE mg/dL   Hgb urine dipstick NEGATIVE NEGATIVE   Bilirubin Urine NEGATIVE NEGATIVE   Ketones, ur NEGATIVE NEGATIVE mg/dL   Protein, ur NEGATIVE NEGATIVE mg/dL   Nitrite NEGATIVE NEGATIVE   Leukocytes,Ua NEGATIVE NEGATIVE  Protein / creatinine ratio, urine     Status: None   Collection Time: 05/16/23  9:01 PM  Result Value Ref Range   Creatinine, Urine 93 mg/dL   Total Protein, Urine 8 mg/dL   Protein Creatinine Ratio 0.09 0.00 - 0.15 mg/mg[Cre]  Troponin I (High Sensitivity)     Status: None   Collection Time: 05/16/23 10:04 PM  Result Value Ref Range   Troponin I (High Sensitivity) 11 <18 ng/L  CBC     Status: Abnormal   Collection Time: 05/16/23 10:04 PM  Result Value Ref Range    WBC 15.1 (H) 4.0 - 10.5 K/uL   RBC 3.66 (L) 3.87 - 5.11 MIL/uL   Hemoglobin 12.0 12.0 - 15.0 g/dL   HCT 96.2 (L) 95.2 - 84.1 %   MCV 95.1 80.0 - 100.0 fL   MCH 32.8  26.0 - 34.0 pg   MCHC 34.5 30.0 - 36.0 g/dL   RDW 16.1 09.6 - 04.5 %   Platelets 340 150 - 400 K/uL   nRBC 0.0 0.0 - 0.2 %  Comprehensive metabolic panel     Status: Abnormal   Collection Time: 05/16/23 10:04 PM  Result Value Ref Range   Sodium 137 135 - 145 mmol/L   Potassium 4.0 3.5 - 5.1 mmol/L   Chloride 107 98 - 111 mmol/L   CO2 21 (L) 22 - 32 mmol/L   Glucose, Bld 116 (H) 70 - 99 mg/dL   BUN 10 6 - 20 mg/dL   Creatinine, Ser 4.09 0.44 - 1.00 mg/dL   Calcium 9.0 8.9 - 81.1 mg/dL   Total Protein 5.8 (L) 6.5 - 8.1 g/dL   Albumin 2.5 (L) 3.5 - 5.0 g/dL   AST 26 15 - 41 U/L   ALT 22 0 - 44 U/L   Alkaline Phosphatase 79 38 - 126 U/L   Total Bilirubin 0.4 0.3 - 1.2 mg/dL   GFR, Estimated >91 >47 mL/min   Anion gap 9 5 - 15      Imaging:  DG Chest Port 1 View  Result Date: 05/15/2023 CLINICAL DATA:  Shortness of breath, cough, chest pain, and wheezing. EXAM: PORTABLE CHEST 1 VIEW COMPARISON:  04/21/2022. FINDINGS: The heart size and mediastinal contours are within normal limits. Both lungs are clear. No acute osseous abnormality. IMPRESSION: No active disease. Electronically Signed   By: Thornell Sartorius M.D.   On: 05/15/2023 04:45    MAU Course/MDM: Orders Placed This Encounter  Procedures   Urinalysis, Routine w reflex microscopic -Urine, Clean Catch   Protein / creatinine ratio, urine   CBC   Comprehensive metabolic panel   Consult to Transition of Care   ED EKG   Discharge patient    Meds ordered this encounter  Medications   guaiFENesin (MUCINEX) 12 hr tablet 1,200 mg   albuterol (PROVENTIL) (2.5 MG/3ML) 0.083% nebulizer solution 2.5 mg   labetalol (NORMODYNE) 100 MG tablet    Sig: Take 3 tablets (300 mg total) by mouth 3 (three) times daily.    Dispense:  140 tablet    Refill:  1    Order  Specific Question:   Supervising Provider    Answer:   Lennart Pall [[id number]]     NST reviewed and reactive, no obstetric symptoms EKG and Troponin negative, lung sounds wnl, no acute findings SOB resolved and CP improved after albuterol nebulizer tx Pt feels like anxiety is part of the problem. She has psychiatrist and therapist and will follow up.  She is lowering her anxiety medication due to the pregnancy so discussed options and pt to f/u with her providers about better management of anxiety. BP elevated but none severe range, PEC labs wnl, no s/sx of PEC Increased labetalol from 200 mg TID to 300 mg TID today, but pt to watch BP at home and f/u with OB provider, as BP may come down after URI resolves Start Z-pak tonight as prescribed and take OTC medications as directed Return to MAU as needed for emergencies   Assessment: 1. Chest pain on breathing   2. Upper respiratory tract infection, unspecified type   3. Chronic hypertension in obstetric context in third trimester   4. [redacted] weeks gestation of pregnancy   5. Anxiety   6. Primary hypertension     Plan: Discharge home Labor precautions and fetal kick counts  Follow-up Information  [place] Follow up.   Why: As scheduled Contact information: [address] [place] [address] [phone]         [facility] Follow up.   Specialty: Obstetrics and Gynecology Why: As needed for emergencies Contact information: [address] [place] [address] [phone]               Allergies as of 05/16/2023   No Known Allergies      Medication List     TAKE these medications    azithromycin 250 MG tablet Commonly known as: Zithromax Z-Pak Take 1 tablet (250 mg total) by mouth daily.   labetalol 100 MG tablet Commonly known as: NORMODYNE Take 3 tablets (300 mg total) by mouth 3 (three) times daily. What changed:   how much to take when to take this   levothyroxine 50 MCG tablet Commonly known as: SYNTHROID Take 1 tablet (50 mcg total) by mouth daily before breakfast.   loratadine 10 MG tablet Commonly known as: CLARITIN Take 1 tablet (10 mg total) by mouth daily.   mirtazapine 15 MG tablet Commonly known as: REMERON Take 15 mg by mouth at bedtime.   sertraline 100 MG tablet Commonly known as: ZOLOFT Take 125 mg by mouth daily.        Sharen Counter Certified Nurse-Midwife 05/16/2023 11:28 PM

## 2023-05-16 NOTE — MAU Note (Addendum)
.  Alexandra Escobar is a 42 y.o. at [redacted]w[redacted]d here in MAU reporting: Pt reporting chest pain/pressure on L side.that began at 1730 today Took BP 150/100 later BP same. States chest pain is more pressure sensation midsternal that "might" radiate to L arm. Pt taking Labetalol 200mg  TID and Baby ASA 81mg  daily Denies visual changes or epigastric pain or headache Endorses + fetal movement.  Pt is not SOB and in no apparent distress.  Thursday was seen here in MAU  Contractions every: None  Pain score:  6 chest pressure  ROM: None  Vaginal Bleeding: None    Fetal Movement: Reports positive FM FHT:    Vitals:   05/16/23 2026  BP: (!) 148/91  Pulse: 86  Resp: 17  Temp: 98.4 F (36.9 C)  SpO2: 99%     OB Office: Phy 4 Women GBS: N/A Lab orders placed from triage: Urinalysis and EKG

## 2023-05-21 DIAGNOSIS — F53 Postpartum depression: Secondary | ICD-10-CM | POA: Diagnosis not present

## 2023-05-23 ENCOUNTER — Encounter (HOSPITAL_COMMUNITY): Payer: Self-pay | Admitting: Obstetrics and Gynecology

## 2023-05-23 ENCOUNTER — Inpatient Hospital Stay (HOSPITAL_COMMUNITY)
Admission: AD | Admit: 2023-05-23 | Discharge: 2023-05-23 | Disposition: A | Discharge status: Home / Self Care | Payer: BC Managed Care – PPO | Attending: Obstetrics and Gynecology | Admitting: Obstetrics and Gynecology

## 2023-05-23 DIAGNOSIS — R6883 Chills (without fever): Secondary | ICD-10-CM | POA: Diagnosis not present

## 2023-05-23 DIAGNOSIS — O26893 Other specified pregnancy related conditions, third trimester: Secondary | ICD-10-CM | POA: Diagnosis not present

## 2023-05-23 DIAGNOSIS — R42 Dizziness and giddiness: Secondary | ICD-10-CM | POA: Diagnosis not present

## 2023-05-23 DIAGNOSIS — Z3A32 32 weeks gestation of pregnancy: Secondary | ICD-10-CM | POA: Diagnosis not present

## 2023-05-23 DIAGNOSIS — O09523 Supervision of elderly multigravida, third trimester: Secondary | ICD-10-CM | POA: Diagnosis not present

## 2023-05-23 DIAGNOSIS — R102 Pelvic and perineal pain: Secondary | ICD-10-CM | POA: Diagnosis not present

## 2023-05-23 DIAGNOSIS — R350 Frequency of micturition: Secondary | ICD-10-CM | POA: Insufficient documentation

## 2023-05-23 LAB — URINALYSIS, ROUTINE W REFLEX MICROSCOPIC
Bilirubin Urine: NEGATIVE
Glucose, UA: NEGATIVE mg/dL
Hgb urine dipstick: NEGATIVE
Ketones, ur: NEGATIVE mg/dL
Leukocytes,Ua: NEGATIVE
Nitrite: NEGATIVE
Protein, ur: NEGATIVE mg/dL
Specific Gravity, Urine: 1.018 (ref 1.005–1.030)
pH: 5 (ref 5.0–8.0)

## 2023-05-23 LAB — COMPREHENSIVE METABOLIC PANEL
ALT: 18 U/L (ref 0–44)
AST: 21 U/L (ref 15–41)
Albumin: 2.3 g/dL — ABNORMAL LOW (ref 3.5–5.0)
Alkaline Phosphatase: 71 U/L (ref 38–126)
Anion gap: 10 (ref 5–15)
BUN: 9 mg/dL (ref 6–20)
CO2: 18 mmol/L — ABNORMAL LOW (ref 22–32)
Calcium: 8.5 mg/dL — ABNORMAL LOW (ref 8.9–10.3)
Chloride: 106 mmol/L (ref 98–111)
Creatinine, Ser: 0.64 mg/dL (ref 0.44–1.00)
GFR, Estimated: >60 mL/min (ref >60)
Glucose, Bld: 89 mg/dL (ref 70–99)
Potassium: 3.9 mmol/L (ref 3.5–5.1)
Sodium: 134 mmol/L — ABNORMAL LOW (ref 135–145)
Total Bilirubin: 0.2 mg/dL — ABNORMAL LOW (ref 0.3–1.2)
Total Protein: 5.5 g/dL — ABNORMAL LOW (ref 6.5–8.1)

## 2023-05-23 LAB — PROTEIN / CREATININE RATIO, URINE
Creatinine, Urine: 118 mg/dL
Protein Creatinine Ratio: 0.1 mg/mg{creat} (ref 0.00–0.15)
Total Protein, Urine: 12 mg/dL

## 2023-05-23 LAB — CBC
HCT: 34.7 % — ABNORMAL LOW (ref 36.0–46.0)
Hemoglobin: 12 g/dL (ref 12.0–15.0)
MCH: 32.8 pg (ref 26.0–34.0)
MCHC: 34.6 g/dL (ref 30.0–36.0)
MCV: 94.8 fL (ref 80.0–100.0)
Platelets: 317 10*3/uL (ref 150–400)
RBC: 3.66 MIL/uL — ABNORMAL LOW (ref 3.87–5.11)
RDW: 14.3 % (ref 11.5–15.5)
WBC: 15.1 10*3/uL — ABNORMAL HIGH (ref 4.0–10.5)
nRBC: 0.1 % (ref 0.0–0.2)

## 2023-05-23 NOTE — MAU Note (Addendum)
.  Alexandra Escobar is a 41 y.o. at [redacted]w[redacted]d here in MAU reporting: lower abdominal pressure-voiding small amounts-continues to feel pressure. Increased frequency with urination States it is not contraction pain-concerned that she may have a UTI  Denies regular or painful contraction, SROM, vaginal bleeding or bloody show. Endorses + fetal movement.  Onset of complaint: 2300 Pain score: "pressure"  5 Vitals:   05/23/23 0601  BP: (!) 149/99  Resp: 17  Temp: 97.9 F (36.6 C)  SpO2: 99%  Also reports she checked her BP prior to arrival  145/101  GHTN on  Labetalol 300mg  TID took last dose at 2100 Denies HA, visual changes or epigastric pain FHT:130bpm Lab orders placed from triage:  UA

## 2023-05-23 NOTE — MAU Provider Note (Signed)
Chief Complaint:  Abdominal Pain   Event Date/Time   First Provider Initiated Contact with Patient 05/23/23 [phone]      HPI: Alexandra Escobar is a 42 y.o. G3P0020 at [redacted]w[redacted]d who presents to maternity admissions reporting constant lower abdominal pressure, urinary frequency but low urine output each void.  The pressure kept her from sleeping last night. She reports blood pressure have been improved sine 10/26 when her labetalol dose was increased but they were elevated this morning before coming in to MAU. She denies h/a, epigastric pain, or visual disturbances.   She reports good fetal movement, denies regular contractions. LOF, vaginal bleeding, vaginal itching/burning, dizziness, n/v, or fever/chills.    HPI  Past Medical History: Past Medical History:  Diagnosis Date   Anxiety    Depression    Endometriosis    Fatty liver    Hypertension    Hypothyroidism    PCOS (polycystic ovarian syndrome)     Past obstetric history: OB History  Gravida Para Term Preterm AB Living  3       2    SAB IAB Ectopic Multiple Live Births  2     0      # Outcome Date GA Lbr Len/2nd Weight Sex Type Anes PTL Lv  3 Current           2 SAB           1 SAB      SAB       Past Surgical History: Past Surgical History:  Procedure Laterality Date   DILATION AND CURETTAGE OF UTERUS  06/2021   LAPAROSCOPY     for endometriosis   NASAL SEPTUM SURGERY     OTHER SURGICAL HISTORY     fibroid removal   OTHER SURGICAL HISTORY     fistula drainage    Family History: Family History  Problem Relation Age of Onset   Hypertension Father    Depression Father    Hypertension Sister    Cancer Maternal Grandmother        breast   Hypertension Paternal Grandmother     Social History: Social History   Tobacco Use   Smoking status: Never    Passive exposure: Never   Smokeless tobacco: Never  Vaping Use   Vaping status: Never Used  Substance Use Topics   Alcohol use: Never   Drug use: Never     Allergies: No Known Allergies  Meds:  Medications Prior to Admission  Medication Sig Dispense Refill Last Dose   aspirin EC 81 MG tablet Take 81 mg by mouth daily. Swallow whole.   05/22/2023   labetalol (NORMODYNE) 100 MG tablet Take 3 tablets (300 mg total) by mouth 3 (three) times daily. 140 tablet 1 05/22/2023   mirtazapine (REMERON) 15 MG tablet Take 15 mg by mouth at bedtime.   05/22/2023   sertraline (ZOLOFT) 100 MG tablet Take 125 mg by mouth daily.   05/22/2023   azithromycin (ZITHROMAX Z-PAK) 250 MG tablet Take 1 tablet (250 mg total) by mouth daily. 6 tablet 0    levothyroxine (SYNTHROID) 50 MCG tablet Take 1 tablet (50 mcg total) by mouth daily before breakfast. 90 tablet 1    loratadine (CLARITIN) 10 MG tablet Take 1 tablet (10 mg total) by mouth daily. 30 tablet 0     ROS:  Review of Systems  Constitutional:  Negative for chills, fatigue and fever.  Eyes:  Negative for visual disturbance.  Respiratory:  Negative for shortness of  breath.   Cardiovascular:  Negative for chest pain.  Gastrointestinal:  Positive for abdominal pain. Negative for nausea and vomiting.  Genitourinary:  Positive for decreased urine volume and pelvic pain. Negative for difficulty urinating, dysuria, flank pain, vaginal bleeding, vaginal discharge and vaginal pain.  Neurological:  Negative for dizziness and headaches.  Psychiatric/Behavioral: Negative.       I have reviewed patient's Past Medical Hx, Surgical Hx, Family Hx, Social Hx, medications and allergies.   Physical Exam  Patient Vitals for the past 24 hrs:  BP Temp Temp src Pulse Resp SpO2 Height Weight  05/23/23 0633 (!) 140/87 -- -- 83 -- 98 % -- --  05/23/23 0622 (!) 140/88 -- -- 79 -- -- -- --  05/23/23 0601 (!) 149/99 97.9 F (36.6 C) Oral -- 17 99 % 5\' 2"  (1.575 m) 115.3 kg   Constitutional: Well-developed, well-nourished female in no acute distress.  Cardiovascular: normal rate Respiratory: normal effort GI: Abd soft,  non-tender, gravid appropriate for gestational age.  MS: Extremities nontender, no edema, normal ROM Neurologic: Alert and oriented x 4.  GU: Neg CVAT.  PELVIC EXAM:   Dilation: Closed Effacement (%): Thick Cervical Position: Posterior Exam by:: Sharen Counter, CNM  FHT:  Baseline *** , moderate variability, accelerations present, no decelerations Contractions: q *** mins   Labs: No results found for this or any previous visit (from the past 24 hour(s)).    Imaging:  DG Chest Port 1 View  Result Date: 05/15/2023 CLINICAL DATA:  Shortness of breath, cough, chest pain, and wheezing. EXAM: PORTABLE CHEST 1 VIEW COMPARISON:  04/21/2022. FINDINGS: The heart size and mediastinal contours are within normal limits. Both lungs are clear. No acute osseous abnormality. IMPRESSION: No active disease. Electronically Signed   By: Thornell Sartorius M.D.   On: 05/15/2023 04:45    MAU Course/MDM: Orders Placed This Encounter  Procedures   Urinalysis, Routine w reflex microscopic -Urine, Clean Catch   Protein / creatinine ratio, urine   CBC   Comprehensive metabolic panel    No orders of the defined types were placed in this encounter.    NST reviewed Consult *** with presentation, exam findings and test results.  Treatments in MAU included ***.   Pt discharge with strict *** precautions.    Assessment: No diagnosis found.  Plan: Discharge home Labor precautions and fetal kick counts  Allergies as of 05/23/2023   No Known Allergies   Med Rec must be completed prior to using this [facility]***       Sharen Counter Certified Nurse-Midwife 05/23/2023 6:45 AM

## 2023-05-26 DIAGNOSIS — O09523 Supervision of elderly multigravida, third trimester: Secondary | ICD-10-CM | POA: Diagnosis not present

## 2023-05-26 DIAGNOSIS — O10013 Pre-existing essential hypertension complicating pregnancy, third trimester: Secondary | ICD-10-CM | POA: Diagnosis not present

## 2023-05-26 DIAGNOSIS — O09813 Supervision of pregnancy resulting from assisted reproductive technology, third trimester: Secondary | ICD-10-CM | POA: Diagnosis not present

## 2023-05-26 DIAGNOSIS — Z3A32 32 weeks gestation of pregnancy: Secondary | ICD-10-CM | POA: Diagnosis not present

## 2023-05-27 DIAGNOSIS — F410 Panic disorder [episodic paroxysmal anxiety] without agoraphobia: Secondary | ICD-10-CM | POA: Diagnosis not present

## 2023-05-27 DIAGNOSIS — F331 Major depressive disorder, recurrent, moderate: Secondary | ICD-10-CM | POA: Diagnosis not present

## 2023-05-28 DIAGNOSIS — F53 Postpartum depression: Secondary | ICD-10-CM | POA: Diagnosis not present

## 2023-05-29 ENCOUNTER — Telehealth: Payer: Self-pay | Admitting: *Deleted

## 2023-05-29 NOTE — Telephone Encounter (Signed)
Called and spoke to patient.   Informed patient she had been placed on an incorrect provider schedule.  Change 06/01/23 appointment with Dr Wyline Mood to Dr Servando Salina.  Appointment is now schedule for 10 am  06/01/23.  Patient agreed and verbalized understanding.

## 2023-06-01 ENCOUNTER — Ambulatory Visit: Payer: BC Managed Care – PPO | Attending: Cardiology | Admitting: Cardiology

## 2023-06-01 ENCOUNTER — Ambulatory Visit: Payer: BC Managed Care – PPO | Admitting: Internal Medicine

## 2023-06-01 ENCOUNTER — Encounter: Payer: Self-pay | Admitting: Cardiology

## 2023-06-01 VITALS — BP 148/100 | HR 92 | Ht 62.0 in | Wt 267.4 lb

## 2023-06-01 DIAGNOSIS — O99210 Obesity complicating pregnancy, unspecified trimester: Secondary | ICD-10-CM

## 2023-06-01 DIAGNOSIS — O10919 Unspecified pre-existing hypertension complicating pregnancy, unspecified trimester: Secondary | ICD-10-CM | POA: Diagnosis not present

## 2023-06-01 DIAGNOSIS — Z3A33 33 weeks gestation of pregnancy: Secondary | ICD-10-CM | POA: Diagnosis not present

## 2023-06-01 MED ORDER — NIFEDIPINE ER OSMOTIC RELEASE 30 MG PO TB24: 30.0000 mg | ORAL_TABLET | Freq: Every day | ORAL | 3 refills | Status: DC

## 2023-06-01 NOTE — Patient Instructions (Addendum)
Medication Instructions:  Your physician has recommended you make the following change in your medication:  START: Nifedipine 30 mg once daily *If you need a refill on your cardiac medications before your next appointment, please call your pharmacy*   Lab Work: None   Testing/Procedures: None   Follow-Up: At [facility], you and your health needs are our priority.  As part of our continuing mission to provide you with exceptional heart care, we have created designated Provider Care Teams.  These Care Teams include your primary Cardiologist (physician) and Advanced Practice Providers (APPs -  Physician Assistants and Nurse Practitioners) who all work together to provide you with the care you need, when you need it.   Your next appointment:   2 week(s)  Provider:   Thomasene Ripple, DO   Other instructions: Please see pharmacy staff in 1 week. Thayer Ohm or Erin)

## 2023-06-01 NOTE — Progress Notes (Signed)
Cardio[facility]st[facility]s Clinic  Follow Up Note   Date:  06/01/2023   ID:  Alexandra, Escobar Jul 15, 1981, MRN [id number]  PCP:  Alexandra Grandchild, MD   [facility] Providers Cardiologist:  Maisie Fus, MD  Electrophysiologist:  None        Referring MD: Alexandra Grandchild, MD   Chief Complaint: " I am ok"  History of Present Illness:    Alexandra Escobar is a 42 y.o. female [G3P0020] who returns for follow up of  hypertension in pregnancy.   Medical history includes chronic hypertension which was diagnosed in 2018, hypothyroidism and she is currently on levothyroxine.   At her last visit her blood pressure was at target and medication changes were made.  Asked the patient to bring her automated blood pressure cuff with her to her next visit.  I also asked her to send me updated blood pressure every 2 weeks.  Which has been at target.  She is currently 33 weeks 2 day  Prior CV Studies Reviewed: The following studies were reviewed today: Reviewed echocardiogram  Past Medical History:  Diagnosis Date   Anxiety    Depression    Endometriosis    Fatty liver    Hypertension    Hypothyroidism    PCOS (polycystic ovarian syndrome)     Past Surgical History:  Procedure Laterality Date   DILATION AND CURETTAGE OF UTERUS  06/2021   LAPAROSCOPY     for endometriosis   NASAL SEPTUM SURGERY     OTHER SURGICAL HISTORY     fibroid removal   OTHER SURGICAL HISTORY     fistula drainage      OB History     Gravida  3   Para      Term      Preterm      AB  2   Living         SAB  2   IAB      Ectopic      Multiple  0   Live Births                  Current Medications: Current Meds  Medication Sig   aspirin EC 81 MG tablet Take 81 mg by mouth daily. Swallow whole.   labetalol (NORMODYNE) 100 MG tablet Take 3 tablets (300 mg total) by mouth 3 (three) times daily.   levothyroxine (SYNTHROID) 50 MCG tablet Take 1 tablet (50 mcg total) by mouth daily  before breakfast.   mirtazapine (REMERON) 15 MG tablet Take 15 mg by mouth at bedtime.   NIFEdipine (PROCARDIA-XL/NIFEDICAL-XL) 30 MG 24 hr tablet Take 1 tablet (30 mg total) by mouth daily.   sertraline (ZOLOFT) 100 MG tablet Take 125 mg by mouth daily.     Allergies:   Patient has no known allergies.   Social History   Socioeconomic History   Marital status: Married    Spouse name: Not on file   Number of children: Not on file   Years of education: Not on file   Highest education level: Some college, no degree  Occupational History   Not on file  Tobacco Use   Smoking status: Never    Passive exposure: Never   Smokeless tobacco: Never  Vaping Use   Vaping status: Never Used  Substance and Sexual Activity   Alcohol use: Never   Drug use: Never   Sexual activity: Not Currently    Partners: Male    Birth control/protection:  None  Other Topics Concern   Not on file  Social History Narrative   Not on file   Social Determinants of Health   Financial Resource Strain: Low Risk  (04/30/2023)   Overall Financial Resource Strain (CARDIA)    Difficulty of Paying Living Expenses: Not very hard  Food Insecurity: No Food Insecurity (04/30/2023)   Hunger Vital Sign    Worried About Running Out of Food in the Last Year: Never true    Ran Out of Food in the Last Year: Never true  Transportation Needs: No Transportation Needs (04/30/2023)   PRAPARE - Administrator, Civil Service (Medical): No    Lack of Transportation (Non-Medical): No  Physical Activity: Unknown (04/30/2023)   Exercise Vital Sign    Days of Exercise per Week: 0 days    Minutes of Exercise per Session: Not on file  Stress: No Stress Concern Present (04/30/2023)   [employer] of Occupational Health - Occupational Stress Questionnaire    Feeling of Stress : Not at all  Social Connections: Moderately Integrated (04/30/2023)   Social Connection and Isolation Panel [NHANES]    Frequency of  Communication with Friends and Family: More than three times a week    Frequency of Social Gatherings with Friends and Family: Twice a week    Attends Religious Services: 1 to 4 times per year    Active Member of [employer] or Organizations: No    Attends Engineer, structural: Not on file    Marital Status: Married      Family History  Problem Relation Age of Onset   Hypertension Father    Depression Father    Hypertension Sister    Cancer Maternal Grandmother        breast   Hypertension Paternal Grandmother       ROS:   Please see the history of present illness.     All other systems reviewed and are negative.   Labs/EKG Reviewed:    EKG:   EKG was not ordered today.    Recent Labs: 11/04/2022: TSH 1.11 05/23/2023: ALT 18; BUN 9; Creatinine, Ser 0.64; Hemoglobin 12.0; Platelets 317; Potassium 3.9; Sodium 134   Recent Lipid Panel No results found for: "CHOL", "TRIG", "HDL", "CHOLHDL", "LDLCALC", "LDLDIRECT"  Physical Exam:    VS:  BP (!) 148/100 (BP Location: Left Arm, Patient Position: Sitting, Cuff Size: Normal)   Pulse 92   Ht 5\' 2"  (1.575 m)   Wt 267 lb 6.4 oz (121.3 kg)   LMP 10/11/2022 (Approximate)   SpO2 96%   BMI 48.91 kg/m     Wt Readings from Last 3 Encounters:  06/01/23 267 lb 6.4 oz (121.3 kg)  05/23/23 254 lb 3.2 oz (115.3 kg)  05/16/23 248 lb 4.8 oz (112.6 kg)     GEN:  Well nourished, well developed in no acute distress HEENT: Normal NECK: No JVD; No carotid bruits LYMPHATICS: No lymphadenopathy CARDIAC: RRR, no murmurs, rubs, gallops RESPIRATORY:  Clear to auscultation without rales, wheezing or rhonchi  ABDOMEN: Soft, non-tender, non-distended MUSCULOSKELETAL:  No edema; No deformity  SKIN: Warm and dry NEUROLOGIC:  Alert and oriented x 3 PSYCHIATRIC:  Normal affect    Risk Assessment/Risk Calculators:                  ASSESSMENT & PLAN:    Chronic Hypertension in Pregnancy Uncontrolled hypertension with frequent  forgetting of medication. High stress due to ongoing divorce. Patient is currently 32-[redacted] weeks pregnant. -  Start Nifedipine to better control blood pressure. -Return visit in 1 week to assess blood pressure control.  Shortness of Breath Noted during sleep with associated wheezing. Likely due to pregnancy but requires monitoring. Monitor symptoms and report any worsening.  General Health Maintenance / Followup Plans -Continue home blood pressure monitoring. -Follow-up in 1 week to assess blood pressure control. -Follow-up in 2 weeks for routine prenatal hypertension in pregnancy     Patient Instructions  Medication Instructions:  Your physician has recommended you make the following change in your medication:  START: Nifedipine 30 mg once daily *If you need a refill on your cardiac medications before your next appointment, please call your pharmacy*   Lab Work: None   Testing/Procedures: None   Follow-Up: At [facility], you and your health needs are our priority.  As part of our continuing mission to provide you with exceptional heart care, we have created designated Provider Care Teams.  These Care Teams include your primary Cardiologist (physician) and Advanced Practice Providers (APPs -  Physician Assistants and Nurse Practitioners) who all work together to provide you with the care you need, when you need it.   Your next appointment:   2 week(s)  Provider:   Thomasene Ripple, DO   Other instructions: Please see pharmacy staff in 1 week. Thayer Ohm or Belenda Cruise)    Dispo:  No follow-ups on file.   Medication Adjustments/Labs and Tests Ordered: Current medicines are reviewed at length with the patient today.  Concerns regarding medicines are outlined above.  Tests Ordered: Orders Placed This Encounter  Procedures   AMB Referral to [facility] Pharm-D   Medication Changes: Meds ordered this encounter  Medications   NIFEdipine (PROCARDIA-XL/NIFEDICAL-XL) 30 MG 24  hr tablet    Sig: Take 1 tablet (30 mg total) by mouth daily.    Dispense:  90 tablet    Refill:  3

## 2023-06-03 DIAGNOSIS — O368330 Maternal care for abnormalities of the fetal heart rate or rhythm, third trimester, not applicable or unspecified: Secondary | ICD-10-CM | POA: Diagnosis not present

## 2023-06-03 DIAGNOSIS — Z3A33 33 weeks gestation of pregnancy: Secondary | ICD-10-CM | POA: Diagnosis not present

## 2023-06-03 DIAGNOSIS — O10013 Pre-existing essential hypertension complicating pregnancy, third trimester: Secondary | ICD-10-CM | POA: Diagnosis not present

## 2023-06-04 ENCOUNTER — Observation Stay (HOSPITAL_COMMUNITY)
Admission: AD | Admit: 2023-06-04 | Discharge: 2023-06-05 | Disposition: A | Discharge status: Home / Self Care | Payer: BC Managed Care – PPO | Attending: Obstetrics and Gynecology | Admitting: Obstetrics and Gynecology

## 2023-06-04 ENCOUNTER — Encounter (HOSPITAL_COMMUNITY): Payer: Self-pay | Admitting: Obstetrics and Gynecology

## 2023-06-04 ENCOUNTER — Observation Stay (HOSPITAL_COMMUNITY): Payer: BC Managed Care – PPO

## 2023-06-04 ENCOUNTER — Other Ambulatory Visit: Payer: Self-pay

## 2023-06-04 DIAGNOSIS — E039 Hypothyroidism, unspecified: Secondary | ICD-10-CM | POA: Insufficient documentation

## 2023-06-04 DIAGNOSIS — O26893 Other specified pregnancy related conditions, third trimester: Secondary | ICD-10-CM | POA: Diagnosis not present

## 2023-06-04 DIAGNOSIS — Z3A33 33 weeks gestation of pregnancy: Secondary | ICD-10-CM | POA: Diagnosis not present

## 2023-06-04 DIAGNOSIS — R0789 Other chest pain: Secondary | ICD-10-CM | POA: Insufficient documentation

## 2023-06-04 DIAGNOSIS — I160 Hypertensive urgency: Secondary | ICD-10-CM | POA: Diagnosis not present

## 2023-06-04 DIAGNOSIS — Z1152 Encounter for screening for COVID-19: Secondary | ICD-10-CM | POA: Diagnosis not present

## 2023-06-04 DIAGNOSIS — R079 Chest pain, unspecified: Secondary | ICD-10-CM | POA: Diagnosis not present

## 2023-06-04 DIAGNOSIS — O133 Gestational [pregnancy-induced] hypertension without significant proteinuria, third trimester: Secondary | ICD-10-CM | POA: Diagnosis not present

## 2023-06-04 DIAGNOSIS — R0602 Shortness of breath: Secondary | ICD-10-CM | POA: Diagnosis not present

## 2023-06-04 LAB — COMPREHENSIVE METABOLIC PANEL
ALT: 18 U/L (ref 0–44)
AST: 23 U/L (ref 15–41)
Albumin: 2.2 g/dL — ABNORMAL LOW (ref 3.5–5.0)
Alkaline Phosphatase: 79 U/L (ref 38–126)
Anion gap: 7 (ref 5–15)
BUN: 11 mg/dL (ref 6–20)
CO2: 19 mmol/L — ABNORMAL LOW (ref 22–32)
Calcium: 8.4 mg/dL — ABNORMAL LOW (ref 8.9–10.3)
Chloride: 105 mmol/L (ref 98–111)
Creatinine, Ser: 0.85 mg/dL (ref 0.44–1.00)
GFR, Estimated: >60 mL/min (ref >60)
Glucose, Bld: 94 mg/dL (ref 70–99)
Potassium: 3.8 mmol/L (ref 3.5–5.1)
Sodium: 131 mmol/L — ABNORMAL LOW (ref 135–145)
Total Bilirubin: 0.4 mg/dL (ref <1.2)
Total Protein: 5.4 g/dL — ABNORMAL LOW (ref 6.5–8.1)

## 2023-06-04 LAB — CBC
HCT: 33.7 % — ABNORMAL LOW (ref 36.0–46.0)
Hemoglobin: 11.5 g/dL — ABNORMAL LOW (ref 12.0–15.0)
MCH: 32.1 pg (ref 26.0–34.0)
MCHC: 34.1 g/dL (ref 30.0–36.0)
MCV: 94.1 fL (ref 80.0–100.0)
Platelets: 309 10*3/uL (ref 150–400)
RBC: 3.58 MIL/uL — ABNORMAL LOW (ref 3.87–5.11)
RDW: 14.4 % (ref 11.5–15.5)
WBC: 14.8 10*3/uL — ABNORMAL HIGH (ref 4.0–10.5)
nRBC: 0.2 % (ref 0.0–0.2)

## 2023-06-04 LAB — URINALYSIS, ROUTINE W REFLEX MICROSCOPIC
Bilirubin Urine: NEGATIVE
Glucose, UA: NEGATIVE mg/dL
Hgb urine dipstick: NEGATIVE
Ketones, ur: NEGATIVE mg/dL
Nitrite: NEGATIVE
Protein, ur: NEGATIVE mg/dL
Specific Gravity, Urine: 1.011 (ref 1.005–1.030)
pH: 6 (ref 5.0–8.0)

## 2023-06-04 LAB — TROPONIN I (HIGH SENSITIVITY)
Troponin I (High Sensitivity): 52 ng/L — ABNORMAL HIGH (ref <18)
Troponin I (High Sensitivity): 58 ng/L — ABNORMAL HIGH (ref <18)
Troponin I (High Sensitivity): 57 ng/L — ABNORMAL HIGH (ref <18)

## 2023-06-04 LAB — RESP PANEL BY RT-PCR (RSV, FLU A&B, COVID)  RVPGX2
Influenza A by PCR: NEGATIVE
Influenza B by PCR: NEGATIVE
Resp Syncytial Virus by PCR: NEGATIVE
SARS Coronavirus 2 by RT PCR: NEGATIVE

## 2023-06-04 LAB — PROTEIN / CREATININE RATIO, URINE
Creatinine, Urine: 57 mg/dL
Protein Creatinine Ratio: 0.26 mg/mg{creat} — ABNORMAL HIGH (ref 0.00–0.15)
Total Protein, Urine: 15 mg/dL

## 2023-06-04 LAB — D-DIMER, QUANTITATIVE: D-Dimer, Quant: 1.97 ug{FEU}/mL — ABNORMAL HIGH (ref 0.00–0.50)

## 2023-06-04 LAB — BRAIN NATRIURETIC PEPTIDE: B Natriuretic Peptide: 52.4 pg/mL (ref 0.0–100.0)

## 2023-06-04 LAB — TYPE AND SCREEN
ABO/RH(D): O POS
Antibody Screen: NEGATIVE

## 2023-06-04 MED ORDER — NIFEDIPINE ER OSMOTIC RELEASE 30 MG PO TB24
30.0000 mg | ORAL_TABLET | Freq: Every day | ORAL | Status: DC
  Administered 2023-06-05: 30 mg via ORAL
  Filled 2023-06-04: qty 1

## 2023-06-04 MED ORDER — DOCUSATE SODIUM 100 MG PO CAPS
100.0000 mg | ORAL_CAPSULE | Freq: Every day | ORAL | Status: DC
  Filled 2023-06-04 (×2): qty 1

## 2023-06-04 MED ORDER — IOHEXOL 350 MG/ML SOLN
75.0000 mL | Freq: Once | INTRAVENOUS | Status: AC | PRN
  Administered 2023-06-04: 75 mL via INTRAVENOUS

## 2023-06-04 MED ORDER — SERTRALINE HCL 50 MG PO TABS
125.0000 mg | ORAL_TABLET | Freq: Every day | ORAL | Status: DC
  Administered 2023-06-04: 125 mg via ORAL
  Filled 2023-06-04: qty 3

## 2023-06-04 MED ORDER — PRENATAL MULTIVITAMIN CH
1.0000 | ORAL_TABLET | Freq: Every day | ORAL | Status: DC
  Administered 2023-06-05: 1 via ORAL
  Filled 2023-06-04 (×2): qty 1

## 2023-06-04 MED ORDER — LORATADINE 10 MG PO TABS
10.0000 mg | ORAL_TABLET | Freq: Every day | ORAL | Status: DC
  Filled 2023-06-04 (×3): qty 1

## 2023-06-04 MED ORDER — LABETALOL HCL 100 MG PO TABS: 300.0000 mg | ORAL_TABLET | Freq: Once | ORAL | Status: DC

## 2023-06-04 MED ORDER — NIFEDIPINE ER OSMOTIC RELEASE 30 MG PO TB24: 30.0000 mg | ORAL_TABLET | Freq: Once | ORAL | Status: DC

## 2023-06-04 MED ORDER — CALCIUM CARBONATE ANTACID 500 MG PO CHEW: 2.0000 | CHEWABLE_TABLET | ORAL | Status: DC | PRN

## 2023-06-04 MED ORDER — SODIUM CHLORIDE 0.9% FLUSH
10.0000 mL | Freq: Two times a day (BID) | INTRAVENOUS | Status: DC
  Administered 2023-06-04: 10 mL via INTRAVENOUS

## 2023-06-04 MED ORDER — LEVOTHYROXINE SODIUM 25 MCG PO TABS
50.0000 ug | ORAL_TABLET | Freq: Every day | ORAL | Status: DC
  Administered 2023-06-04 – 2023-06-05 (×2): 50 ug via ORAL
  Filled 2023-06-04 (×2): qty 2

## 2023-06-04 MED ORDER — ACETAMINOPHEN 325 MG PO TABS: 650.0000 mg | ORAL_TABLET | ORAL | Status: DC | PRN

## 2023-06-04 MED ORDER — MIRTAZAPINE 15 MG PO TABS
15.0000 mg | ORAL_TABLET | Freq: Every day | ORAL | Status: DC
  Administered 2023-06-04: 15 mg via ORAL
  Filled 2023-06-04 (×2): qty 1

## 2023-06-04 MED ORDER — LORAZEPAM 1 MG PO TABS: 0.5000 mg | ORAL_TABLET | ORAL | Status: DC | PRN

## 2023-06-04 MED ORDER — LABETALOL HCL 200 MG PO TABS
300.0000 mg | ORAL_TABLET | Freq: Three times a day (TID) | ORAL | Status: DC
  Administered 2023-06-04 – 2023-06-05 (×3): 300 mg via ORAL
  Filled 2023-06-04 (×3): qty 1

## 2023-06-04 MED ORDER — ZOLPIDEM TARTRATE 5 MG PO TABS: 5.0000 mg | ORAL_TABLET | Freq: Every evening | ORAL | Status: DC | PRN

## 2023-06-04 MED ORDER — LORAZEPAM BOLUS VIA INFUSION: 0.5000 mg | INTRAVENOUS | Status: DC | PRN

## 2023-06-04 MED ORDER — ASPIRIN 81 MG PO TBEC
81.0000 mg | DELAYED_RELEASE_TABLET | Freq: Every day | ORAL | Status: DC
  Administered 2023-06-04 – 2023-06-05 (×2): 81 mg via ORAL
  Filled 2023-06-04 (×2): qty 1

## 2023-06-04 NOTE — H&P (Addendum)
Chief Complaint:  Chest Pain and Hypertension   Event Date/Time   First Provider Initiated Contact with Patient 06/04/23 0620     HPI: Buffie Pessolano is a 42 y.o. G3P0020 at 48w5dwho presents to maternity admissions reporting pressure in left side of chest which is worsened by taking blood pressure.   Has had this pressure off and on for weeks but states they told her it was normal    Took her BP at  home and it was severe level   States had bloodwork in office yesterday but BPs were better. . She reports good fetal movement, denies LOF, vaginal bleeding, urinary symptoms, or fever/chills.  She denies headache, visual changes or RUQ abdominal pain.  Just saw Dr Servando Salina on 11/11 for BP surveillance and she added Procardia XL 30mg .   Chest Pain  This is a recurrent problem. The current episode started today. The onset quality is gradual. The problem has been unchanged. The pain is present in the lateral region. The quality of the pain is described as dull and pressure. The pain does not radiate. Associated symptoms include shortness of breath (for weeks). Pertinent negatives include no abdominal pain, back pain, cough, dizziness, fever, headaches, palpitations or sputum production.  Her past medical history is significant for hypertension.  Hypertension This is a chronic problem. The problem has been gradually worsening since onset. Associated symptoms include chest pain and shortness of breath (for weeks). Pertinent negatives include no blurred vision, headaches or palpitations. There are no associated agents to hypertension. Treatments tried: Labetalol and Procardia XL. There are no compliance problems.    RN Note: .Tujuana Ewings is a 42 y.o. at [redacted]w[redacted]d here in [facility] reporting: elevated blood pressure 174/104 and pressure on left side of chest. States yesterday she got phone call from someone she was around and they tested positive for COVID so she would like to be tested. Denies HA, visual changes, epigastric  pain, ctx, VB, LOF, or DFM. On labetalol 300 mg 3x/day - last dose at 2100 last night. Also taking nifedipine 30mg  once a day - last dose at 1000 yesterday morning.   Onset of complaint: 0300  Past Medical History: Past Medical History:  Diagnosis Date   Anxiety    Depression    Endometriosis    Fatty liver    Hypertension    Hypothyroidism    PCOS (polycystic ovarian syndrome)     Past obstetric history: OB History  Gravida Para Term Preterm AB Living  3       2    SAB IAB Ectopic Multiple Live Births  2     0      # Outcome Date GA Lbr Len/2nd Weight Sex Type Anes PTL Lv  3 Current           2 SAB           1 SAB      SAB       Past Surgical History: Past Surgical History:  Procedure Laterality Date   DILATION AND CURETTAGE OF UTERUS  06/2021   LAPAROSCOPY     for endometriosis   NASAL SEPTUM SURGERY     OTHER SURGICAL HISTORY     fibroid removal   OTHER SURGICAL HISTORY     fistula drainage    Family History: Family History  Problem Relation Age of Onset   Hypertension Father    Depression Father    Hypertension Sister    Cancer Maternal Grandmother  breast   Hypertension Paternal Grandmother     Social History: Social History   Tobacco Use   Smoking status: Never    Passive exposure: Never   Smokeless tobacco: Never  Vaping Use   Vaping status: Never Used  Substance Use Topics   Alcohol use: Never   Drug use: Never    Allergies: No Known Allergies  Meds:  Medications Prior to Admission  Medication Sig Dispense Refill Last Dose   aspirin EC 81 MG tablet Take 81 mg by mouth daily. Swallow whole.      azithromycin (ZITHROMAX Z-PAK) 250 MG tablet Take 1 tablet (250 mg total) by mouth daily. 6 tablet 0    labetalol (NORMODYNE) 100 MG tablet Take 3 tablets (300 mg total) by mouth 3 (three) times daily. 140 tablet 1    levothyroxine (SYNTHROID) 50 MCG tablet Take 1 tablet (50 mcg total) by mouth daily before breakfast. 90 tablet 1     loratadine (CLARITIN) 10 MG tablet Take 1 tablet (10 mg total) by mouth daily. 30 tablet 0    mirtazapine (REMERON) 15 MG tablet Take 15 mg by mouth at bedtime.      NIFEdipine (PROCARDIA-XL/NIFEDICAL-XL) 30 MG 24 hr tablet Take 1 tablet (30 mg total) by mouth daily. 90 tablet 3    sertraline (ZOLOFT) 100 MG tablet Take 125 mg by mouth daily.       I have reviewed patient's Past Medical Hx, Surgical Hx, Family Hx, Social Hx, medications and allergies.   ROS:  Review of Systems  Constitutional:  Negative for fever.  Eyes:  Negative for blurred vision.  Respiratory:  Positive for shortness of breath (for weeks). Negative for cough and sputum production.   Cardiovascular:  Positive for chest pain. Negative for palpitations.  Gastrointestinal:  Negative for abdominal pain.  Musculoskeletal:  Negative for back pain.  Neurological:  Negative for dizziness and headaches.   Other systems negative  Physical Exam   Vitals:   06/04/23 0604 06/04/23 0626  BP: (!) 164/103 (!) 155/96  Pulse: 78   Resp: 18   Temp: 98.1 F (36.7 C)   TempSrc: Oral   SpO2: 100%   Weight: 122.7 kg   Height: 5\' 2"  (1.575 m)    Vitals:   06/04/23 0604 06/04/23 0626 06/04/23 0646 06/04/23 0715  BP: (!) 164/103 (!) 155/96 (!) 155/92 (!) 143/86   06/04/23 0745 -- 77 -- -- 123/77 -- 98 % -- -- -- -- VF  06/04/23 0730 -- 80 -- -- 131/76 -- 98 % -- -- -- -- VF  06/04/23 0715 -- 80 -- -- 143/86 Abnormal  -- 97 % -- -- -- -- VF  06/04/23 0646 -- 73 -- -- 155/92 Abnormal            Constitutional: Well-developed, well-nourished female in no acute distress.  Cardiovascular: normal rate and rhythm Respiratory: normal effort, clear to auscultation bilaterally GI: Abd soft, non-tender, gravid appropriate for gestational age.   No rebound or guarding. MS: Extremities nontender, no edema, normal ROM Neurologic: Alert and oriented x 4.  GU: Neg CVAT.  PELVIC EXAM: deferred  FHT:  Baseline 125 , moderate  variability, accelerations present, no decelerations Contractions:  Rare   Labs: CBC, CMET, Pr/Cr Ratio, D-Dimer, BNP, Troponin and Respiratory Panel (exposed to Covid yesterday) ordered.   Results for orders placed or performed during the hospital encounter of 06/04/23 (from the past 24 hour(s))  Resp panel by RT-PCR (RSV, Flu A&B, Covid) Anterior Nasal Swab  Status: None   Collection Time: 06/04/23  6:26 AM   Specimen: Anterior Nasal Swab  Result Value Ref Range   SARS Coronavirus 2 by RT PCR NEGATIVE NEGATIVE   Influenza A by PCR NEGATIVE NEGATIVE   Influenza B by PCR NEGATIVE NEGATIVE   Resp Syncytial Virus by PCR NEGATIVE NEGATIVE  Urinalysis, Routine w reflex microscopic -Urine, Clean Catch     Status: Abnormal   Collection Time: 06/04/23  6:35 AM  Result Value Ref Range   Color, Urine YELLOW YELLOW   APPearance CLOUDY (A) CLEAR   Specific Gravity, Urine 1.011 1.005 - 1.030   pH 6.0 5.0 - 8.0   Glucose, UA NEGATIVE NEGATIVE mg/dL   Hgb urine dipstick NEGATIVE NEGATIVE   Bilirubin Urine NEGATIVE NEGATIVE   Ketones, ur NEGATIVE NEGATIVE mg/dL   Protein, ur NEGATIVE NEGATIVE mg/dL   Nitrite NEGATIVE NEGATIVE   Leukocytes,Ua MODERATE (A) NEGATIVE   RBC / HPF 0-5 0 - 5 RBC/hpf   WBC, UA 11-20 0 - 5 WBC/hpf   Bacteria, UA RARE (A) NONE SEEN   Squamous Epithelial / HPF 21-50 0 - 5 /HPF   Mucus PRESENT   Protein / creatinine ratio, urine     Status: Abnormal   Collection Time: 06/04/23  6:35 AM  Result Value Ref Range   Creatinine, Urine 57 mg/dL   Total Protein, Urine 15 mg/dL   Protein Creatinine Ratio 0.26 (H) 0.00 - 0.15 mg/mg[Cre]  CBC     Status: Abnormal   Collection Time: 06/04/23  6:51 AM  Result Value Ref Range   WBC 14.8 (H) 4.0 - 10.5 K/uL   RBC 3.58 (L) 3.87 - 5.11 MIL/uL   Hemoglobin 11.5 (L) 12.0 - 15.0 g/dL   HCT 16.1 (L) 09.6 - 04.5 %   MCV 94.1 80.0 - 100.0 fL   MCH 32.1 26.0 - 34.0 pg   MCHC 34.1 30.0 - 36.0 g/dL   RDW 40.9 81.1 - 91.4 %    Platelets 309 150 - 400 K/uL   nRBC 0.2 0.0 - 0.2 %  Comprehensive metabolic panel     Status: Abnormal   Collection Time: 06/04/23  6:51 AM  Result Value Ref Range   Sodium 131 (L) 135 - 145 mmol/L   Potassium 3.8 3.5 - 5.1 mmol/L   Chloride 105 98 - 111 mmol/L   CO2 19 (L) 22 - 32 mmol/L   Glucose, Bld 94 70 - 99 mg/dL   BUN 11 6 - 20 mg/dL   Creatinine, Ser 7.82 0.44 - 1.00 mg/dL   Calcium 8.4 (L) 8.9 - 10.3 mg/dL   Total Protein 5.4 (L) 6.5 - 8.1 g/dL   Albumin 2.2 (L) 3.5 - 5.0 g/dL   AST 23 15 - 41 U/L   ALT 18 0 - 44 U/L   Alkaline Phosphatase 79 38 - 126 U/L   Total Bilirubin 0.4 <1.2 mg/dL   GFR, Estimated >95 >62 mL/min   Anion gap 7 5 - 15  Troponin I (High Sensitivity)     Status: Abnormal   Collection Time: 06/04/23  6:51 AM  Result Value Ref Range   Troponin I (High Sensitivity) 52 (H) <18 ng/L  Brain natriuretic peptide     Status: None   Collection Time: 06/04/23  6:51 AM  Result Value Ref Range   B Natriuretic Peptide 52.4 0.0 - 100.0 pg/mL  D-dimer, quantitative     Status: Abnormal   Collection Time: 06/04/23  7:36 AM  Result Value  Ref Range   D-Dimer, Quant 1.97 (H) 0.00 - 0.50 ug/mL-FEU     Imaging:  CT Angiogram ordered  MAU Course/MDM: I have reviewed the triage vital signs and the nursing notes.   Pertinent labs & imaging results that were available during my care of the patient were reviewed by me and considered in my medical decision making (see chart for details).      I have reviewed her medical records including past results, notes and treatments.   I have ordered labs and reviewed results of labs resulted so far.  Resp Panel negative.  Normal platelets.  Slight leukocytosis.  NST reviewed, reassuring  Consult Drs Despina Hidden and Elon Spanner with presentation, exam findings and test results. Will proceed with CT Angiogram and cardiac/pulmonary labs Treatments in MAU included EFM, IV, CTA.Marland Kitchen    Assessment: Single IUP at [redacted]w[redacted]d Chronic Hypertension with  exacerbation Left anterior chest pressure and pain Mild ST changes on EKG.  Plan: Care turned over to Dr Imogene Burn Patient on the way to CTA Cardiology consulted  Wynelle Bourgeois CNM, MSN Certified Nurse-Midwife 06/04/2023 6:20 AM  -----------------------------------------------------------------  Presented initially with left sided chest pressure and severe range Bps in setting of recent COVID exposure. Was seen by cardiology outpatient 11/11 when they added Procardia for BP control.  Feeling excellent FM. Mild abdominal cramping. No VB/LOF. Since she presented to the MAU, she was given her AM doses of antihypertensives and Bps have improved. She reports chest pressure is improved.  EKG with normal sinus rhythm with nonspecific ST abnormality. Troponin elevated to 52 > orders in to trend. D dimer also elevated, however I discussed with the patient that this is oftentimes an unreliable marker in pregnancy. CT-A already ordered, transport in. Cardiology consulted. We discussed in the setting of chest pressure and elevated troponin, even if her imaging is reassuring, I would recommend observation to monitor her symptoms and trend troponins. For obs on OB specialty care, will place on telemetry.   FHT reassuring Continue home labetalol 300mg  TID and procardia 30 qday; and daily ASA 81mg  Continue home synthroid Continue home zoloft  Jule Economy, MD

## 2023-06-04 NOTE — MAU Note (Signed)
.  Alexandra Escobar is a 42 y.o. at [redacted]w[redacted]d here in [facility]U reporting: elevated blood pressure 174/104 and pressure on left side of chest. States yesterday she got phone call from someone she was around and they tested positive for COVID so she would like to be tested. Denies HA, visual changes, epigastric pain, ctx, VB, LOF, or DFM. On labetalol 300 mg 3x/day - last dose at 2100 last night. Also taking nifedipine 30mg  once a day - last dose at 1000 yesterday morning.   Onset of complaint: 0300 Pain score: 8  Vitals:   06/04/23 0604  BP: (!) 164/103  Pulse: 78  Resp: 18  Temp: 98.1 F (36.7 C)  SpO2: 100%     FHT:130 Lab orders placed from triage:  UA

## 2023-06-04 NOTE — MAU Provider Note (Addendum)
Chief Complaint:  Chest Pain and Hypertension   Event Date/Time   First Provider Initiated Contact with Patient 06/04/23 0620     HPI: Alexandra Escobar is a 42 y.o. G3P0020 at 57w5dwho presents to maternity admissions reporting pressure in left side of chest which is worsened by taking blood pressure.   Has had this pressure off and on for weeks but states they told her it was normal    Took her BP at  home and it was severe level   States had bloodwork in office yesterday but BPs were better. . She reports good fetal movement, denies LOF, vaginal bleeding, urinary symptoms, or fever/chills.  She denies headache, visual changes or RUQ abdominal pain.  Just saw Dr Servando Salina on 11/11 for BP surveillance and she added Procardia XL 30mg .   Chest Pain  This is a recurrent problem. The current episode started today. The onset quality is gradual. The problem has been unchanged. The pain is present in the lateral region. The quality of the pain is described as dull and pressure. The pain does not radiate. Associated symptoms include shortness of breath (for weeks). Pertinent negatives include no abdominal pain, back pain, cough, dizziness, fever, headaches, palpitations or sputum production.  Her past medical history is significant for hypertension.  Hypertension This is a chronic problem. The problem has been gradually worsening since onset. Associated symptoms include chest pain and shortness of breath (for weeks). Pertinent negatives include no blurred vision, headaches or palpitations. There are no associated agents to hypertension. Treatments tried: Labetalol and Procardia XL. There are no compliance problems.    RN Note: .Alexandra Escobar is a 42 y.o. at [redacted]w[redacted]d here in MAU reporting: elevated blood pressure 174/104 and pressure on left side of chest. States yesterday she got phone call from someone she was around and they tested positive for COVID so she would like to be tested. Denies HA, visual changes, epigastric  pain, ctx, VB, LOF, or DFM. On labetalol 300 mg 3x/day - last dose at 2100 last night. Also taking nifedipine 30mg  once a day - last dose at 1000 yesterday morning.   Onset of complaint: 0300  Past Medical History: Past Medical History:  Diagnosis Date   Anxiety    Depression    Endometriosis    Fatty liver    Hypertension    Hypothyroidism    PCOS (polycystic ovarian syndrome)     Past obstetric history: OB History  Gravida Para Term Preterm AB Living  3       2    SAB IAB Ectopic Multiple Live Births  2     0      # Outcome Date GA Lbr Len/2nd Weight Sex Type Anes PTL Lv  3 Current           2 SAB           1 SAB      SAB       Past Surgical History: Past Surgical History:  Procedure Laterality Date   DILATION AND CURETTAGE OF UTERUS  06/2021   LAPAROSCOPY     for endometriosis   NASAL SEPTUM SURGERY     OTHER SURGICAL HISTORY     fibroid removal   OTHER SURGICAL HISTORY     fistula drainage    Family History: Family History  Problem Relation Age of Onset   Hypertension Father    Depression Father    Hypertension Sister    Cancer Maternal Grandmother  breast   Hypertension Paternal Grandmother     Social History: Social History   Tobacco Use   Smoking status: Never    Passive exposure: Never   Smokeless tobacco: Never  Vaping Use   Vaping status: Never Used  Substance Use Topics   Alcohol use: Never   Drug use: Never    Allergies: No Known Allergies  Meds:  Medications Prior to Admission  Medication Sig Dispense Refill Last Dose   aspirin EC 81 MG tablet Take 81 mg by mouth daily. Swallow whole.      azithromycin (ZITHROMAX Z-PAK) 250 MG tablet Take 1 tablet (250 mg total) by mouth daily. 6 tablet 0    labetalol (NORMODYNE) 100 MG tablet Take 3 tablets (300 mg total) by mouth 3 (three) times daily. 140 tablet 1    levothyroxine (SYNTHROID) 50 MCG tablet Take 1 tablet (50 mcg total) by mouth daily before breakfast. 90 tablet 1     loratadine (CLARITIN) 10 MG tablet Take 1 tablet (10 mg total) by mouth daily. 30 tablet 0    mirtazapine (REMERON) 15 MG tablet Take 15 mg by mouth at bedtime.      NIFEdipine (PROCARDIA-XL/NIFEDICAL-XL) 30 MG 24 hr tablet Take 1 tablet (30 mg total) by mouth daily. 90 tablet 3    sertraline (ZOLOFT) 100 MG tablet Take 125 mg by mouth daily.       I have reviewed patient's Past Medical Hx, Surgical Hx, Family Hx, Social Hx, medications and allergies.   ROS:  Review of Systems  Constitutional:  Negative for fever.  Eyes:  Negative for blurred vision.  Respiratory:  Positive for shortness of breath (for weeks). Negative for cough and sputum production.   Cardiovascular:  Positive for chest pain. Negative for palpitations.  Gastrointestinal:  Negative for abdominal pain.  Musculoskeletal:  Negative for back pain.  Neurological:  Negative for dizziness and headaches.   Other systems negative  Physical Exam   Vitals:   06/04/23 0604 06/04/23 0626  BP: (!) 164/103 (!) 155/96  Pulse: 78   Resp: 18   Temp: 98.1 F (36.7 C)   TempSrc: Oral   SpO2: 100%   Weight: 122.7 kg   Height: 5\' 2"  (1.575 m)    Vitals:   06/04/23 0604 06/04/23 0626 06/04/23 0646 06/04/23 0715  BP: (!) 164/103 (!) 155/96 (!) 155/92 (!) 143/86   06/04/23 0745 -- 77 -- -- 123/77 -- 98 % -- -- -- -- VF  06/04/23 0730 -- 80 -- -- 131/76 -- 98 % -- -- -- -- VF  06/04/23 0715 -- 80 -- -- 143/86 Abnormal  -- 97 % -- -- -- -- VF  06/04/23 0646 -- 73 -- -- 155/92 Abnormal            Constitutional: Well-developed, well-nourished female in no acute distress.  Cardiovascular: normal rate and rhythm Respiratory: normal effort, clear to auscultation bilaterally GI: Abd soft, non-tender, gravid appropriate for gestational age.   No rebound or guarding. MS: Extremities nontender, no edema, normal ROM Neurologic: Alert and oriented x 4.  GU: Neg CVAT.  PELVIC EXAM: deferred  FHT:  Baseline 125 , moderate  variability, accelerations present, no decelerations Contractions:  Rare   Labs: CBC, CMET, Pr/Cr Ratio, D-Dimer, BNP, Troponin and Respiratory Panel (exposed to Covid yesterday) ordered.   Results for orders placed or performed during the hospital encounter of 06/04/23 (from the past 24 hour(s))  Resp panel by RT-PCR (RSV, Flu A&B, Covid) Anterior Nasal Swab  Status: None   Collection Time: 06/04/23  6:26 AM   Specimen: Anterior Nasal Swab  Result Value Ref Range   SARS Coronavirus 2 by RT PCR NEGATIVE NEGATIVE   Influenza A by PCR NEGATIVE NEGATIVE   Influenza B by PCR NEGATIVE NEGATIVE   Resp Syncytial Virus by PCR NEGATIVE NEGATIVE  Urinalysis, Routine w reflex microscopic -Urine, Clean Catch     Status: Abnormal   Collection Time: 06/04/23  6:35 AM  Result Value Ref Range   Color, Urine YELLOW YELLOW   APPearance CLOUDY (A) CLEAR   Specific Gravity, Urine 1.011 1.005 - 1.030   pH 6.0 5.0 - 8.0   Glucose, UA NEGATIVE NEGATIVE mg/dL   Hgb urine dipstick NEGATIVE NEGATIVE   Bilirubin Urine NEGATIVE NEGATIVE   Ketones, ur NEGATIVE NEGATIVE mg/dL   Protein, ur NEGATIVE NEGATIVE mg/dL   Nitrite NEGATIVE NEGATIVE   Leukocytes,Ua MODERATE (A) NEGATIVE   RBC / HPF 0-5 0 - 5 RBC/hpf   WBC, UA 11-20 0 - 5 WBC/hpf   Bacteria, UA RARE (A) NONE SEEN   Squamous Epithelial / HPF 21-50 0 - 5 /HPF   Mucus PRESENT   Protein / creatinine ratio, urine     Status: Abnormal   Collection Time: 06/04/23  6:35 AM  Result Value Ref Range   Creatinine, Urine 57 mg/dL   Total Protein, Urine 15 mg/dL   Protein Creatinine Ratio 0.26 (H) 0.00 - 0.15 mg/mg[Cre]  CBC     Status: Abnormal   Collection Time: 06/04/23  6:51 AM  Result Value Ref Range   WBC 14.8 (H) 4.0 - 10.5 K/uL   RBC 3.58 (L) 3.87 - 5.11 MIL/uL   Hemoglobin 11.5 (L) 12.0 - 15.0 g/dL   HCT 16.1 (L) 09.6 - 04.5 %   MCV 94.1 80.0 - 100.0 fL   MCH 32.1 26.0 - 34.0 pg   MCHC 34.1 30.0 - 36.0 g/dL   RDW 40.9 81.1 - 91.4 %    Platelets 309 150 - 400 K/uL   nRBC 0.2 0.0 - 0.2 %  Comprehensive metabolic panel     Status: Abnormal   Collection Time: 06/04/23  6:51 AM  Result Value Ref Range   Sodium 131 (L) 135 - 145 mmol/L   Potassium 3.8 3.5 - 5.1 mmol/L   Chloride 105 98 - 111 mmol/L   CO2 19 (L) 22 - 32 mmol/L   Glucose, Bld 94 70 - 99 mg/dL   BUN 11 6 - 20 mg/dL   Creatinine, Ser 7.82 0.44 - 1.00 mg/dL   Calcium 8.4 (L) 8.9 - 10.3 mg/dL   Total Protein 5.4 (L) 6.5 - 8.1 g/dL   Albumin 2.2 (L) 3.5 - 5.0 g/dL   AST 23 15 - 41 U/L   ALT 18 0 - 44 U/L   Alkaline Phosphatase 79 38 - 126 U/L   Total Bilirubin 0.4 <1.2 mg/dL   GFR, Estimated >95 >62 mL/min   Anion gap 7 5 - 15  Troponin I (High Sensitivity)     Status: Abnormal   Collection Time: 06/04/23  6:51 AM  Result Value Ref Range   Troponin I (High Sensitivity) 52 (H) <18 ng/L  Brain natriuretic peptide     Status: None   Collection Time: 06/04/23  6:51 AM  Result Value Ref Range   B Natriuretic Peptide 52.4 0.0 - 100.0 pg/mL  D-dimer, quantitative     Status: Abnormal   Collection Time: 06/04/23  7:36 AM  Result Value  Ref Range   D-Dimer, Quant 1.97 (H) 0.00 - 0.50 ug/mL-FEU     Imaging:  CT Angiogram ordered  MAU Course/MDM: I have reviewed the triage vital signs and the nursing notes.   Pertinent labs & imaging results that were available during my care of the patient were reviewed by me and considered in my medical decision making (see chart for details).      I have reviewed her medical records including past results, notes and treatments.   I have ordered labs and reviewed results of labs resulted so far.  Resp Panel negative.  Normal platelets.  Slight leukocytosis.  NST reviewed, reassuring  Consult Drs Despina Hidden and Elon Spanner with presentation, exam findings and test results. Will proceed with CT Angiogram and cardiac/pulmonary labs Treatments in MAU included EFM, IV, CTA.Marland Kitchen    Assessment: Single IUP at [redacted]w[redacted]d Chronic Hypertension with  exacerbation Left anterior chest pressure and pain Mild ST changes on EKG.  Plan: Care turned over to Dr Imogene Burn Patient on the way to CTA Cardiology consulted  Wynelle Bourgeois CNM, MSN Certified Nurse-Midwife 06/04/2023 6:20 AM

## 2023-06-04 NOTE — Consult Note (Signed)
Cardiology Consultation   Patient ID: Alexandra Escobar MRN: [id number]; DOB: 04/06/1981  Admit date: 06/04/2023 Date of Consult: 06/04/2023  PCP:  Etta Grandchild, MD    [facility] Providers Cardiologist:  Maisie Fus, MD   {    Patient Profile:   Alexandra Escobar is a 42 y.o. female with a hx of hypertension, anxiety with depression, hypothyroidism, PCOS, who is being seen 06/04/2023 for the evaluation of chest pain at the request of Dr. Imogene Burn.  History of Present Illness:   Ms. Mansi with above past medical history who is currently admitted at maternity assessment unit for chest pressure.  She is currently 33 weeks 5 days pregnant.  She presented to MAU at 6 AM today with elevated blood pressure 174/104.  She reports being around someone who was tested positive for COVID yesterday.  She has been having left-sided chest pressure over the past several weeks intermittently. She noted her chest pain occurs when her BP is high. She felt pain is worse when she takes a deep breath.  She had ongoing problem with HTN before her pregnancy. She noted her BP has been elevated to 160s more frequently over the past 3 weeks.   She last saw Dr. Servando Salina 06/01/2023, blood pressure was 148/100.  She reported going through a lot of stress from ongoing divorce.  She was already labetalol 300 mg TID, and nifedipine 30 mg was added to better control her blood pressure.  She has been compliant with her medication. She felt her BP improves after her medication, but it will spike up again shortly after. She also felt her arms and legs are more swollen for the past 2 weeks. She denied family hx of premature CAD. She denied any current chest pain. She states her vision is altered and seeing some spots this morning. She denied any seizure activity, nausea, emesis, SOB. She denied any tobacco use, ETOH use, illicit drug use.   Diagnostic workup today revealed hyponatremia 131, bicarb 19, albumin 2.2.  BNP 52.  High  sensitive troponin 52 >58 >57.  CBC with WBC 14800, hemoglobin 11.5.  D-dimer elevated 1.97.  Flu / RSV/COVID negative. UA indicates pyuria. Urine protein Cr ratio 0.26. EKG showed sinus rhythm 76 bpm, no acute ST-T changes.  CTA PE study negative.  Cardiology is consulted for further evaluation of chest pain today.   Echocardiogram last completed 05/19/2022 revealing LVEF 65 to 70%, no regional wall motion abnormality, normal diastolic parameter, global longitudinal strain is normal, normal RV, PASP 25.5 mmHg, trivial MR.     Past Medical History:  Diagnosis Date   Anxiety    Depression    Endometriosis    Fatty liver    Hypertension    Hypothyroidism    PCOS (polycystic ovarian syndrome)     Past Surgical History:  Procedure Laterality Date   DILATION AND CURETTAGE OF UTERUS  06/2021   LAPAROSCOPY     for endometriosis   NASAL SEPTUM SURGERY     OTHER SURGICAL HISTORY     fibroid removal   OTHER SURGICAL HISTORY     fistula drainage     Home Medications:  Prior to Admission medications   Medication Sig Start Date End Date Taking? Authorizing Provider  aspirin EC 81 MG tablet Take 81 mg by mouth daily. Swallow whole.   Yes [provider]  labetalol (NORMODYNE) 100 MG tablet Take 3 tablets (300 mg total) by mouth 3 (three) times daily. 05/16/23  Yes Leftwich-Kirby,  Wilmer Floor, CNM  levothyroxine (SYNTHROID) 50 MCG tablet Take 1 tablet (50 mcg total) by mouth daily before breakfast. 11/04/22  Yes Etta Grandchild, MD  mirtazapine (REMERON) 15 MG tablet Take 15 mg by mouth at bedtime. 12/29/22  Yes [provider]  NIFEdipine (PROCARDIA-XL/NIFEDICAL-XL) 30 MG 24 hr tablet Take 1 tablet (30 mg total) by mouth daily. 06/01/23  Yes Tobb, Kardie, DO  sertraline (ZOLOFT) 100 MG tablet Take 125 mg by mouth daily. 12/19/22  Yes [provider]  loratadine (CLARITIN) 10 MG tablet Take 1 tablet (10 mg total) by mouth daily. 05/15/23   Aviva Signs, CNM     Inpatient Medications: Scheduled Meds:  aspirin EC  81 mg Oral Daily   docusate sodium  100 mg Oral Daily   labetalol  300 mg Oral TID   levothyroxine  50 mcg Oral QAC breakfast   loratadine  10 mg Oral Daily   mirtazapine  15 mg Oral QHS   [START ON 06/05/2023] NIFEdipine  30 mg Oral Daily   prenatal multivitamin  1 tablet Oral Q1200   sertraline  125 mg Oral Daily   sodium chloride flush  10 mL Intravenous Q12H   Continuous Infusions:  PRN Meds: acetaminophen, calcium carbonate, LORazepam, zolpidem  Allergies:   No Known Allergies  Social History:   Social History   Socioeconomic History   Marital status: Married    Spouse name: Not on file   Number of children: Not on file   Years of education: Not on file   Highest education level: Some college, no degree  Occupational History   Not on file  Tobacco Use   Smoking status: Never    Passive exposure: Never   Smokeless tobacco: Never  Vaping Use   Vaping status: Never Used  Substance and Sexual Activity   Alcohol use: Never   Drug use: Never   Sexual activity: Not Currently    Partners: Male    Birth control/protection: None  Other Topics Concern   Not on file  Social History Narrative   Not on file   Social Determinants of Health   Financial Resource Strain: Low Risk  (04/30/2023)   Overall Financial Resource Strain (CARDIA)    Difficulty of Paying Living Expenses: Not very hard  Food Insecurity: No Food Insecurity (04/30/2023)   Hunger Vital Sign    Worried About Running Out of Food in the Last Year: Never true    Ran Out of Food in the Last Year: Never true  Transportation Needs: No Transportation Needs (04/30/2023)   PRAPARE - Administrator, Civil Service (Medical): No    Lack of Transportation (Non-Medical): No  Physical Activity: Unknown (04/30/2023)   Exercise Vital Sign    Days of Exercise per Week: 0 days    Minutes of Exercise per Session: Not on file  Stress: No Stress  Concern Present (04/30/2023)   [employer] of Occupational Health - Occupational Stress Questionnaire    Feeling of Stress : Not at all  Social Connections: Moderately Integrated (04/30/2023)   Social Connection and Isolation Panel [NHANES]    Frequency of Communication with Friends and Family: More than three times a week    Frequency of Social Gatherings with Friends and Family: Twice a week    Attends Religious Services: 1 to 4 times per year    Active Member of [employer] or Organizations: No    Attends Banker Meetings: Not on file  Marital Status: Married  Catering manager Violence: Unknown (10/25/2021)   Received from [facility]   HITS    Physically Hurt: Not on file    Insult or Talk Down To: Not on file    Threaten Physical Harm: Not on file    Scream or Curse: Not on file    Family History:    Family History  Problem Relation Age of Onset   Hypertension Father    Depression Father    Hypertension Sister    Cancer Maternal Grandmother        breast   Hypertension Paternal Grandmother      ROS:  Constitutional: Denied fever, chills, malaise, night sweats Eyes: Denied vision change or loss Ears/Nose/Mouth/Throat: Denied ear ache, sore throat, coughing, sinus pain Cardiovascular: see HPI  Respiratory:see HPI Gastrointestinal: Denied nausea, vomiting, abdominal pain, diarrhea Genital/Urinary: Denied dysuria, hematuria, urinary frequency/urgency Musculoskeletal: Denied muscle ache, joint pain, weakness Skin: Denied rash, wound Neuro: vision change  Psych:  history of depression/anxiety  Endocrine: Denied history of diabetes    Physical Exam/Data:   Vitals:   06/04/23 0905 06/04/23 0910 06/04/23 0925 06/04/23 1200  BP:   (!) 146/88 128/72  Pulse: 78 80 79 81  Resp:   20 19  Temp:   97.8 F (36.6 C) 98 F (36.7 C)  TempSrc:   Oral Oral  SpO2: 98% 98%  99%  Weight:      Height:       No intake or output data in the 24 hours  ending 06/04/23 1453    06/04/2023    6:04 AM 06/01/2023   10:18 AM 05/23/2023    6:01 AM  Last 3 Weights  Weight (lbs) 270 lb 6.4 oz 267 lb 6.4 oz 254 lb 3.2 oz  Weight (kg) 122.653 kg 121.292 kg 115.304 kg     Body mass index is 49.46 kg/m.   Vitals:  Vitals:   06/04/23 0925 06/04/23 1200  BP: (!) 146/88 128/72  Pulse: 79 81  Resp: 20 19  Temp: 97.8 F (36.6 C) 98 F (36.7 C)  SpO2:  99%   General Appearance: In no apparent distress, laying in bed HEENT: Normocephalic, atraumatic.  Neck: Supple, trachea midline, no JVDs Cardiovascular: Regular rate and rhythm, normal S1-S2,  no murmur Respiratory: Resting breathing unlabored, lungs sounds clear to auscultation bilaterally, no use of accessory muscles. On room air.  No wheezes, rales or rhonchi.   Gastrointestinal: Abdomen soft, non-tender Extremities: Able to move all extremities in bed without difficulty, non-pitting edema noted of BUE and BLE  Musculoskeletal: Normal muscle bulk and tone Skin: Intact, warm, dry. No rashes or petechiae noted in exposed areas.  Neurologic: Alert, oriented to person, place and time. no gross focal neuro deficit Psychiatric: Normal affect. Mood is appropriate.     EKG:  The EKG was personally reviewed and demonstrates:    EKG reveals sinus rhythm 76 bpm, no acute ST-T changes compared to EKG from 05/15/2023  Telemetry:  Telemetry was personally reviewed and demonstrates:  N/A   Relevant CV Studies:   Echo at 05/19/2022:   1. Left ventricular ejection fraction, by estimation, is 65 to 70%. The  left ventricle has normal function. The left ventricle has no regional  wall motion abnormalities. Left ventricular diastolic parameters were  normal. The average left ventricular  global longitudinal strain is -20.7 %. The global longitudinal strain is  normal.   2. Right ventricular systolic function is normal. The right ventricular  size is normal. There is normal pulmonary artery  systolic pressure. The  estimated right ventricular systolic pressure is 25.5 mmHg.   3. The mitral valve is grossly normal. Trivial mitral valve  regurgitation. No evidence of mitral stenosis.   4. The aortic valve is tricuspid. Aortic valve regurgitation is not  visualized. No aortic stenosis is present.   5. The inferior vena cava is normal in size with greater than 50%  respiratory variability, suggesting right atrial pressure of 3 mmHg.   Conclusion(s)/Recommendation(s): Normal biventricular function without  evidence of hemodynamically significant valvular heart disease.   Laboratory Data:  High Sensitivity Troponin:   Recent Labs  Lab 05/15/23 0356 05/16/23 2204 06/04/23 0651 06/04/23 0828 06/04/23 1043  TROPONINIHS 16 11 52* 58* 57*     Chemistry Recent Labs  Lab 06/04/23 0651  NA 131*  K 3.8  CL 105  CO2 19*  GLUCOSE 94  BUN 11  CREATININE 0.85  CALCIUM 8.4*  GFRNONAA >60  ANIONGAP 7    Recent Labs  Lab 06/04/23 0651  PROT 5.4*  ALBUMIN 2.2*  AST 23  ALT 18  ALKPHOS 79  BILITOT 0.4   Lipids No results for input(s): "CHOL", "TRIG", "HDL", "LABVLDL", "LDLCALC", "CHOLHDL" in the last 168 hours.  Hematology Recent Labs  Lab 06/04/23 0651  WBC 14.8*  RBC 3.58*  HGB 11.5*  HCT 33.7*  MCV 94.1  MCH 32.1  MCHC 34.1  RDW 14.4  PLT 309   Thyroid No results for input(s): "TSH", "FREET4" in the last 168 hours.  BNP Recent Labs  Lab 06/04/23 0651  BNP 52.4    DDimer  Recent Labs  Lab 06/04/23 0736  DDIMER 1.97*     Radiology/Studies:  CT Angio Chest PE W and/or Wo Contrast  Result Date: 06/04/2023 CLINICAL DATA:  Chest pain and shortness of breath. Pregnant. Clinical suspicion for pulmonary embolism. EXAM: CT ANGIOGRAPHY CHEST WITH CONTRAST TECHNIQUE: Multidetector CT imaging of the chest was performed using the standard protocol during bolus administration of intravenous contrast. Multiplanar CT image reconstructions and MIPs were  obtained to evaluate the vascular anatomy. RADIATION DOSE REDUCTION: This exam was performed according to the departmental dose-optimization program which includes automated exposure control, adjustment of the mA and/or kV according to patient size and/or use of iterative reconstruction technique. CONTRAST:  75mL OMNIPAQUE IOHEXOL 350 MG/ML SOLN COMPARISON:  04/21/2022 FINDINGS: Cardiovascular: Satisfactory opacification of pulmonary arteries noted, however, respiratory motion artifact limits evaluation of the peripheral lower lobe pulmonary arteries. No pulmonary emboli identified. No evidence of thoracic aortic dissection or aneurysm. Mediastinum/Nodes: No masses or pathologically enlarged lymph nodes identified. Lungs/Pleura: No pulmonary mass, infiltrate, or effusion. Upper abdomen: No acute findings. Musculoskeletal: No suspicious bone lesions identified. Review of the MIP images confirms the above findings. IMPRESSION: No evidence of pulmonary embolism or other active disease. Electronically Signed   By: Danae Orleans M.D.   On: 06/04/2023 13:47     Assessment and Plan:   Chest pain Elevated troponin Hypertensive urgency - Presented with left-sided chest pressure in the setting of uncontrolled hypertension 174/104 and 33 weeks 5-day pregnancy - Hs trop 52 >58 >57, flat trend  - EKG no acute ischemic finding - CT PE negative  - will check Echo to evaluate cardiomyopathy  - on PTA labetalol 300 mg TID and nifedipine 30 mg daily, BP is currently controlled 128/72 on this regimen, continue trend, defer further BP management to primary OB team; suspect HTN urgency related chest discomfort, low suspicion  for ACS   Risk Assessment/Risk Scores:  {  For questions or updates, please contact [facility] Please consult [email or web address] for contact info under    Signed, Cyndi Bender, NP  06/04/2023 2:53 PM

## 2023-06-04 NOTE — Progress Notes (Signed)
Feeling much improved, no further chest pain. Vs normal. We discussed stable troponin trend. I reviewed with her that I called radiology for CT read, will notify patient when results. Discussed cardiology consult. Prn Ativan ordered for anxiety.

## 2023-06-04 NOTE — Plan of Care (Signed)
  Problem: Education: Goal: Knowledge of General Education information will improve Description: Including pain rating scale, medication(s)/side effects and non-pharmacologic comfort measures Outcome: Progressing   Problem: Health Behavior/Discharge Planning: Goal: Ability to manage health-related needs will improve Outcome: Progressing   Problem: Clinical Measurements: Goal: Ability to maintain clinical measurements within normal limits will improve Outcome: Progressing Goal: Will remain free from infection Outcome: Progressing Goal: Diagnostic test results will improve Outcome: Progressing Goal: Respiratory complications will improve Outcome: Progressing Goal: Cardiovascular complication will be avoided Outcome: Progressing   Problem: Activity: Goal: Risk for activity intolerance will decrease Outcome: Progressing   Problem: Nutrition: Goal: Adequate nutrition will be maintained Outcome: Progressing   Problem: Coping: Goal: Level of anxiety will decrease Outcome: Progressing   Problem: Elimination: Goal: Will not experience complications related to bowel motility Outcome: Progressing Goal: Will not experience complications related to urinary retention Outcome: Progressing   Problem: Pain Management: Goal: General experience of comfort will improve Outcome: Progressing   Problem: Safety: Goal: Ability to remain free from injury will improve Outcome: Progressing   Problem: Skin Integrity: Goal: Risk for impaired skin integrity will decrease Outcome: Progressing   Problem: Education: Goal: Knowledge of disease or condition will improve Outcome: Progressing Goal: Knowledge of the prescribed therapeutic regimen will improve Outcome: Progressing Goal: Individualized Educational Video(s) Outcome: Progressing   Problem: Clinical Measurements: Goal: Complications related to the disease process, condition or treatment will be avoided or minimized Outcome:  Progressing

## 2023-06-04 NOTE — MAU Note (Signed)
CT requesting transport for patient.

## 2023-06-05 ENCOUNTER — Observation Stay (HOSPITAL_BASED_OUTPATIENT_CLINIC_OR_DEPARTMENT_OTHER): Payer: BC Managed Care – PPO

## 2023-06-05 DIAGNOSIS — R079 Chest pain, unspecified: Secondary | ICD-10-CM | POA: Diagnosis not present

## 2023-06-05 LAB — ECHOCARDIOGRAM COMPLETE
AR max vel: 1.8 cm2
AV Area VTI: 1.91 cm2
AV Area mean vel: 1.82 cm2
AV Mean grad: 7.3 mm[Hg]
AV Peak grad: 13.8 mm[Hg]
Ao pk vel: 1.86 m/s
Area-P 1/2: 3.91 cm2
Height: 62 in
MV M vel: 4.37 m/s
MV Peak grad: 76.4 mm[Hg]
S' Lateral: 2.4 cm
Weight: 4326.4 [oz_av]

## 2023-06-05 MED ORDER — NIFEDIPINE ER 30 MG PO TB24: 30.0000 mg | ORAL_TABLET | Freq: Two times a day (BID) | ORAL | 1 refills | Status: DC

## 2023-06-05 MED ORDER — NIFEDIPINE ER OSMOTIC RELEASE 30 MG PO TB24
30.0000 mg | ORAL_TABLET | Freq: Once | ORAL | Status: AC
  Administered 2023-06-05: 30 mg via ORAL
  Filled 2023-06-05: qty 1

## 2023-06-05 MED ORDER — NIFEDIPINE ER OSMOTIC RELEASE 30 MG PO TB24: 30.0000 mg | ORAL_TABLET | Freq: Two times a day (BID) | ORAL | Status: DC

## 2023-06-05 NOTE — Discharge Summary (Signed)
Physician Discharge Summary  Patient ID: Alexandra Escobar MRN: [id number] DOB/AGE: Nov 29, 1980 42 y.o.  Admit date: 06/04/2023 Discharge date: 06/05/2023  Admission Diagnoses:IUP at 33 weeks, Chronic HTN with elevated BPs, Chest Pain  Discharge Diagnoses: Same Principal Problem:   Chest pain   Discharged Condition: good  Hospital Course: Pt admitted for chest pain, CT was neg for PE.  BP controlled previously with Ob/Cardiology meds.  Cardiology workup was normal and called it Hypertensive urgency and had a normal ECHO without change from previous ECHO.  CP resolved.  Fetal monitoring was reassuring and she has follow up with [place] in the office and with her cardiologist.  Consults: cardiology  Significant Diagnostic Studies: CT, ECHO and labs  Treatments: Diagnostic evaluation which was essentially normal and monitor BP, and fetal monitoring  Discharge Exam: Blood pressure (!) 154/93, pulse 80, temperature 98.4 F (36.9 C), temperature source Oral, resp. rate 18, height 5\' 2"  (1.575 m), weight 122.7 kg, last menstrual period 10/11/2022, SpO2 100%, unknown if currently breastfeeding. General appearance: alert, cooperative, appears stated age, and no distress  Disposition: Discharge disposition: 01-Home or Self Care       Discharge Instructions     Discharge activity:  No Restrictions   Complete by: As directed          Signed: Turner Daniels 06/05/2023, 12:29 PM

## 2023-06-05 NOTE — Progress Notes (Signed)
Echocardiogram 2D Echocardiogram has been performed.  Alexandra Escobar 06/05/2023, 9:45 AM

## 2023-06-05 NOTE — Progress Notes (Signed)
Patient ID: Alexandra Escobar, female   DOB: [birth year], 42 y.o.   MRN: [id number] Zahara just got back from her ECHO Reports great Fetal movement Occas cramping No HAs, scotomata, RUQ pain VSSAF 133/79   BP                       Temp    98.1 ...   97.8 ...   98 (3...   97.9 ...     97.7 ...   Temp    Temp Source    Oral   Oral   Oral   Oral     Oral   Temp Source    Pulse Rate    73 80 79 79   81   94     89   Pulse Rate    Resp    18   20   19   20     16    Resp    SpO2    99 96 98 98   99   98     100      FHR 125 with accels Abd Gravid nt DTRs 1/4   IUP at 33 6/7 HTN and Chest pain - stable on meds and awaiting ECHO results and Cardiology guidance.  IF stable, may d/c home with fu outpatient Fetal monitoring reassuring. DL

## 2023-06-05 NOTE — Progress Notes (Signed)
    BP remains up and down - could increase nifedipine to 60 mg daily for better control. Echo is reassuring -  LVEF is normal, no regional WMA's and no significant change from prior echo last year. Ok to d/c with follow-up in the cardio-obstetrics clinic.  Chrystie Nose, MD, [facility], FACP  [place]  [facility]  Medical Director of the [facility]  Card[facility] Clinic Diplomate of the American Board of Clinical Lipidology Attending Cardiologist  Direct Dial: [phone]  Fax: [phone]  Website:  [email or web address]

## 2023-06-08 DIAGNOSIS — O09813 Supervision of pregnancy resulting from assisted reproductive technology, third trimester: Secondary | ICD-10-CM | POA: Diagnosis not present

## 2023-06-08 DIAGNOSIS — O10013 Pre-existing essential hypertension complicating pregnancy, third trimester: Secondary | ICD-10-CM | POA: Diagnosis not present

## 2023-06-08 DIAGNOSIS — O09523 Supervision of elderly multigravida, third trimester: Secondary | ICD-10-CM | POA: Diagnosis not present

## 2023-06-08 DIAGNOSIS — Z3A34 34 weeks gestation of pregnancy: Secondary | ICD-10-CM | POA: Diagnosis not present

## 2023-06-09 DIAGNOSIS — F331 Major depressive disorder, recurrent, moderate: Secondary | ICD-10-CM | POA: Diagnosis not present

## 2023-06-09 DIAGNOSIS — F410 Panic disorder [episodic paroxysmal anxiety] without agoraphobia: Secondary | ICD-10-CM | POA: Diagnosis not present

## 2023-06-10 DIAGNOSIS — O169 Unspecified maternal hypertension, unspecified trimester: Secondary | ICD-10-CM | POA: Diagnosis not present

## 2023-06-10 DIAGNOSIS — F53 Postpartum depression: Secondary | ICD-10-CM | POA: Diagnosis not present

## 2023-06-10 DIAGNOSIS — Z3A34 34 weeks gestation of pregnancy: Secondary | ICD-10-CM | POA: Diagnosis not present

## 2023-06-11 ENCOUNTER — Ambulatory Visit: Payer: BC Managed Care – PPO | Attending: Internal Medicine | Admitting: Pharmacist

## 2023-06-11 VITALS — BP 130/84 | HR 88 | Wt 278.0 lb

## 2023-06-11 DIAGNOSIS — I10 Essential (primary) hypertension: Secondary | ICD-10-CM | POA: Diagnosis not present

## 2023-06-11 MED ORDER — FUROSEMIDE 20 MG PO TABS: 20.0000 mg | ORAL_TABLET | Freq: Every day | ORAL | 0 refills | Status: DC

## 2023-06-11 NOTE — Progress Notes (Signed)
Patient ID: Alexandra Escobar                 DOB: 09-03-1980                      MRN: [id number]     HPI: Alexandra Escobar is a 42 y.o. female referred to cardio OB for HTN management.  Patient presents today with cousin. Patient feels poorly, walking with a cane. Hands, feet, ankles are very swollen. Can not close her hand fully. Is now using a walker at home.  Is currently staying at parents house. Family members are helping care for her which is distressing to her because she feels like a burden. Can no longer sleep in bed due to SOB. Rests on a recliner at night. Experiencing SOB at rest.   Currently managed on nifedipine 60mg  in morning and 30mg  at night along with labetalol 300mg  three times daily. This is managing HTN well.   Current HTN meds:  Labetalol 300mg  TID Nifedpine 30mg  daily  Wt Readings from Last 3 Encounters:  06/04/23 270 lb 6.4 oz (122.7 kg)  06/01/23 267 lb 6.4 oz (121.3 kg)  05/23/23 254 lb 3.2 oz (115.3 kg)   BP Readings from Last 3 Encounters:  06/05/23 (!) 154/93  06/01/23 (!) 148/100  05/23/23 133/87   Pulse Readings from Last 3 Encounters:  06/05/23 80  06/01/23 92  05/23/23 81    Renal function: Estimated Creatinine Clearance: 109.9 mL/min (by C-G formula based on SCr of 0.85 mg/dL).  Past Medical History:  Diagnosis Date   Anxiety    Depression    Endometriosis    Fatty liver    Hypertension    Hypothyroidism    PCOS (polycystic ovarian syndrome)     Current Outpatient Medications on File Prior to Visit  Medication Sig Dispense Refill   aspirin EC 81 MG tablet Take 81 mg by mouth daily. Swallow whole.     labetalol (NORMODYNE) 100 MG tablet Take 3 tablets (300 mg total) by mouth 3 (three) times daily. 140 tablet 1   levothyroxine (SYNTHROID) 50 MCG tablet Take 1 tablet (50 mcg total) by mouth daily before breakfast. 90 tablet 1   loratadine (CLARITIN) 10 MG tablet Take 1 tablet (10 mg total) by mouth daily. 30 tablet 0   mirtazapine (REMERON)  15 MG tablet Take 15 mg by mouth at bedtime.     NIFEdipine (ADALAT CC) 30 MG 24 hr tablet Take 1 tablet (30 mg total) by mouth 2 (two) times daily. 60 tablet 1   sertraline (ZOLOFT) 100 MG tablet Take 125 mg by mouth daily.     No current facility-administered medications on file prior to visit.    No Known Allergies   Assessment/Plan:  1. Hypertension -  Patient BP controlled on nifedipine and labetalol. No changes to HTN medications needed at this time.  2. Edema - Patient has gained 11# in last ten days with what appears to be fluid gain. Hands, legs very swollen. Interfering with sleep and respiration. Would benefit from lasix. Confirmed with Dr Servando Salina. Will send furosemide 20mg  x 3 days through weekend and patient will contact clinic on Monday with status.  Laural Golden, PharmD, BCACP, CDCES, CPP [address], [address] [place], [place], [address] Phone: [phone], Fax: [phone]

## 2023-06-11 NOTE — Patient Instructions (Addendum)
It was nice meeting you today  Your blood pressure is much improved today  Please continue your labetalol 300mg  three times a day  Please continue your nifedipine 60mg  in morning and 30mg  at night  I have sent in 3 days worth of furosemide to see if that helps with your fluid buildup  Please bring your blood pressure cuff with you on Tuesday  Please message with any concerns  Laural Golden, PharmD, BCACP, CDCES, CPP [address] [place], [place], [address] Phone: [phone], Fax: [phone]

## 2023-06-12 ENCOUNTER — Inpatient Hospital Stay (HOSPITAL_COMMUNITY)
Admission: AD | Admit: 2023-06-12 | Discharge: 2023-06-12 | Disposition: A | Discharge status: Home / Self Care | Payer: BC Managed Care – PPO | Attending: Obstetrics and Gynecology | Admitting: Obstetrics and Gynecology

## 2023-06-12 ENCOUNTER — Encounter (HOSPITAL_COMMUNITY): Payer: Self-pay | Admitting: Obstetrics and Gynecology

## 2023-06-12 ENCOUNTER — Inpatient Hospital Stay (HOSPITAL_BASED_OUTPATIENT_CLINIC_OR_DEPARTMENT_OTHER): Payer: BC Managed Care – PPO

## 2023-06-12 ENCOUNTER — Encounter: Payer: Self-pay | Admitting: Pharmacist

## 2023-06-12 ENCOUNTER — Inpatient Hospital Stay (HOSPITAL_COMMUNITY): Payer: BC Managed Care – PPO

## 2023-06-12 DIAGNOSIS — R0609 Other forms of dyspnea: Secondary | ICD-10-CM | POA: Diagnosis not present

## 2023-06-12 DIAGNOSIS — O26893 Other specified pregnancy related conditions, third trimester: Secondary | ICD-10-CM | POA: Insufficient documentation

## 2023-06-12 DIAGNOSIS — O10013 Pre-existing essential hypertension complicating pregnancy, third trimester: Secondary | ICD-10-CM

## 2023-06-12 DIAGNOSIS — O99213 Obesity complicating pregnancy, third trimester: Secondary | ICD-10-CM | POA: Diagnosis not present

## 2023-06-12 DIAGNOSIS — E669 Obesity, unspecified: Secondary | ICD-10-CM | POA: Diagnosis not present

## 2023-06-12 DIAGNOSIS — O09523 Supervision of elderly multigravida, third trimester: Secondary | ICD-10-CM | POA: Diagnosis not present

## 2023-06-12 DIAGNOSIS — E8770 Fluid overload, unspecified: Secondary | ICD-10-CM | POA: Insufficient documentation

## 2023-06-12 DIAGNOSIS — O99283 Endocrine, nutritional and metabolic diseases complicating pregnancy, third trimester: Secondary | ICD-10-CM | POA: Diagnosis not present

## 2023-06-12 DIAGNOSIS — Z3A34 34 weeks gestation of pregnancy: Secondary | ICD-10-CM | POA: Insufficient documentation

## 2023-06-12 DIAGNOSIS — O10913 Unspecified pre-existing hypertension complicating pregnancy, third trimester: Secondary | ICD-10-CM | POA: Insufficient documentation

## 2023-06-12 DIAGNOSIS — I10 Essential (primary) hypertension: Secondary | ICD-10-CM

## 2023-06-12 LAB — URINALYSIS, ROUTINE W REFLEX MICROSCOPIC
Bilirubin Urine: NEGATIVE
Glucose, UA: NEGATIVE mg/dL
Hgb urine dipstick: NEGATIVE
Ketones, ur: NEGATIVE mg/dL
Leukocytes,Ua: NEGATIVE
Nitrite: NEGATIVE
Protein, ur: 100 mg/dL — AB
Specific Gravity, Urine: 1.02 (ref 1.005–1.030)
pH: 6 (ref 5.0–8.0)

## 2023-06-12 LAB — COMPREHENSIVE METABOLIC PANEL
ALT: 17 U/L (ref 0–44)
AST: 24 U/L (ref 15–41)
Albumin: 2.3 g/dL — ABNORMAL LOW (ref 3.5–5.0)
Alkaline Phosphatase: 85 U/L (ref 38–126)
Anion gap: 10 (ref 5–15)
BUN: 13 mg/dL (ref 6–20)
CO2: 19 mmol/L — ABNORMAL LOW (ref 22–32)
Calcium: 8.7 mg/dL — ABNORMAL LOW (ref 8.9–10.3)
Chloride: 105 mmol/L (ref 98–111)
Creatinine, Ser: 0.7 mg/dL (ref 0.44–1.00)
GFR, Estimated: >60 mL/min (ref >60)
Glucose, Bld: 79 mg/dL (ref 70–99)
Potassium: 4.3 mmol/L (ref 3.5–5.1)
Sodium: 134 mmol/L — ABNORMAL LOW (ref 135–145)
Total Bilirubin: 0.5 mg/dL (ref <1.2)
Total Protein: 5.5 g/dL — ABNORMAL LOW (ref 6.5–8.1)

## 2023-06-12 LAB — CBC
HCT: 36.2 % (ref 36.0–46.0)
Hemoglobin: 12.3 g/dL (ref 12.0–15.0)
MCH: 32.5 pg (ref 26.0–34.0)
MCHC: 34 g/dL (ref 30.0–36.0)
MCV: 95.8 fL (ref 80.0–100.0)
Platelets: 323 10*3/uL (ref 150–400)
RBC: 3.78 MIL/uL — ABNORMAL LOW (ref 3.87–5.11)
RDW: 14.5 % (ref 11.5–15.5)
WBC: 15.4 10*3/uL — ABNORMAL HIGH (ref 4.0–10.5)
nRBC: 0.1 % (ref 0.0–0.2)

## 2023-06-12 LAB — RESP PANEL BY RT-PCR (RSV, FLU A&B, COVID)  RVPGX2
Influenza A by PCR: NEGATIVE
Influenza B by PCR: NEGATIVE
Resp Syncytial Virus by PCR: NEGATIVE
SARS Coronavirus 2 by RT PCR: NEGATIVE

## 2023-06-12 MED ORDER — FUROSEMIDE 10 MG/ML IJ SOLN
40.0000 mg | Freq: Once | INTRAMUSCULAR | Status: AC
  Administered 2023-06-12: 40 mg via INTRAVENOUS
  Filled 2023-06-12: qty 4

## 2023-06-12 MED ORDER — LABETALOL HCL 100 MG PO TABS
300.0000 mg | ORAL_TABLET | Freq: Once | ORAL | Status: AC
  Administered 2023-06-12: 300 mg via ORAL
  Filled 2023-06-12: qty 3

## 2023-06-12 NOTE — MAU Provider Note (Signed)
History     CSN: [id number]  Arrival date and time: 06/12/23 1446   Event Date/Time   First Provider Initiated Contact with Patient 06/12/2023  2:57 PM   Chief Complaint  Patient presents with   Shortness of Breath   Edema    HPI  Alexandra Escobar is a 42 y.o. G3P0020 at [redacted]w[redacted]d who presents to the MAU for shortness of breath. Pt recently seen and admitted for the same. CTA chest and echo were reassuring. Pt was discharged. Recently saw cardio-OB who recommended a course of Lasix. She was hesitant to take Lasix. Presenting today with SOB -- just talked to her primary OB who recommended she start on Lasix to help with SOB. Denies CP, palp. Having significant pedal edema and edema of b/l upper extremities. Unable to bend knees secondary to tense skin with edema. Edema not unilateral in nature. No VB, LOF, ctx. Having good FM.  Past Medical History:  Diagnosis Date   Anxiety    Depression    Endometriosis    Fatty liver    Hypertension    Hypothyroidism    PCOS (polycystic ovarian syndrome)     Past Surgical History:  Procedure Laterality Date   DILATION AND CURETTAGE OF UTERUS  06/2021   LAPAROSCOPY     for endometriosis   NASAL SEPTUM SURGERY     OTHER SURGICAL HISTORY     fibroid removal   OTHER SURGICAL HISTORY     fistula drainage    Family History  Problem Relation Age of Onset   Hypertension Father    Depression Father    Hypertension Sister    Cancer Maternal Grandmother        breast   Hypertension Paternal Grandmother     Social History   Tobacco Use   Smoking status: Never    Passive exposure: Never   Smokeless tobacco: Never  Vaping Use   Vaping status: Never Used  Substance Use Topics   Alcohol use: Never   Drug use: Never    Allergies: No Known Allergies  No medications prior to admission.    ROS reviewed and pertinent positives and negatives as documented in HPI.  Physical Exam   Blood pressure 137/79, pulse 88, temperature 97.7 F (36.5  C), temperature source Oral, resp. rate 20, last menstrual period 10/11/2022, SpO2 100%, unknown if currently breastfeeding.  Physical Exam Constitutional:      General: She is not in acute distress.    Appearance: Normal appearance. She is not ill-appearing.  HENT:     Head: Normocephalic and atraumatic.  Cardiovascular:     Rate and Rhythm: Normal rate.  Pulmonary:     Effort: Pulmonary effort is normal.     Breath sounds: Rhonchi present.  Abdominal:     Palpations: Abdomen is soft.     Tenderness: There is no abdominal tenderness. There is no guarding.  Musculoskeletal:        General: Normal range of motion.     Right lower leg: Edema (3+ nonpitting) present.     Left lower leg: Edema (3+ nonpitting) present.  Skin:    General: Skin is warm and dry.     Findings: No rash.  Neurological:     General: No focal deficit present.     Mental Status: She is alert and oriented to person, place, and time.   EFM: 125/mod/+a/-d  MAU Course  Procedures  MDM 42 y.o. G3P0020 at [redacted]w[redacted]d presenting for hypervolemia. Symptomatic with SOB. Was Rx'ed  Lasix, but hesitant to take. +Rhonchi and edema on exam. Case discussed with Dr. Servando Salina, who recommended giving a dose of IV Lasix 40mg . Pt had excellent diuresis with this dose and edema and dyspnea resolved with diuresis.   Initially nonreactive NST, sent for BPP which was 6/8 (lost points for breathing). Case discussed with Dr. Elon Spanner, who recommended extending monitoring and repeat BPP in 24 hours. Discussed plan in detail with patient as well as return precautions. Pt to return to MAU 11/23 at 1200. Pt agreeable to plan.   Assessment and Plan  Hypervolemia, unspecified hypervolemia type - Plan: Discharge patient  Dyspnea on exertion - Plan: Discharge patient  Chronic hypertension - Plan: Discharge patient S/p IV Lasix, cont home Lasix course  Nonreactive NST - Plan: Discharge patient Strip reactive prior to d/c BPP 6/8 Plan for repeat  BPP and extended monitoring 11/23  Sundra Aland, MD OB Fellow, Faculty Practice [facility]  06/12/2023, 9:38 PM

## 2023-06-12 NOTE — MAU Note (Signed)
..  Breniya Dabu is a 42 y.o. at [redacted]w[redacted]d here in MAU reporting: about two days ago she started feeling very short of breath and feels like she has a URI. States her family members were saying that she is wheezing in her sleep. Also reporting an increase in swelling. Saw Dr. Servando Salina yesterday d/t elevated troponin the last time she was hospitalized. Was started on 20mg  of furosemide but has not taken her first dose yet. Did take her morning dose of labetalol 300mg , but has not taken her 1500 dose. Denies VB or LOF. +FM.   Pain score: 0 Vitals:   06/12/23 1506  BP: (!) 143/74  Pulse: 87  Resp: (!) 26  Temp: 97.7 F (36.5 C)  SpO2: 100%     FHT:137 Lab orders placed from triage:   UA, Respiratory panel, EKG

## 2023-06-13 ENCOUNTER — Inpatient Hospital Stay (HOSPITAL_COMMUNITY)
Admit: 2023-06-13 | Discharge: 2023-06-13 | Disposition: A | Discharge status: Home / Self Care | Payer: BC Managed Care – PPO | Attending: Obstetrics and Gynecology | Admitting: Obstetrics and Gynecology

## 2023-06-13 ENCOUNTER — Encounter (HOSPITAL_COMMUNITY): Payer: Self-pay | Admitting: Obstetrics and Gynecology

## 2023-06-13 ENCOUNTER — Inpatient Hospital Stay (HOSPITAL_COMMUNITY)
Admission: AD | Admit: 2023-06-13 | Discharge: 2023-06-13 | Disposition: A | Discharge status: Home / Self Care | Payer: BC Managed Care – PPO | Attending: Obstetrics and Gynecology | Admitting: Obstetrics and Gynecology

## 2023-06-13 ENCOUNTER — Inpatient Hospital Stay (HOSPITAL_BASED_OUTPATIENT_CLINIC_OR_DEPARTMENT_OTHER): Payer: BC Managed Care – PPO

## 2023-06-13 DIAGNOSIS — O10013 Pre-existing essential hypertension complicating pregnancy, third trimester: Secondary | ICD-10-CM | POA: Insufficient documentation

## 2023-06-13 DIAGNOSIS — O09523 Supervision of elderly multigravida, third trimester: Secondary | ICD-10-CM | POA: Insufficient documentation

## 2023-06-13 DIAGNOSIS — E8770 Fluid overload, unspecified: Secondary | ICD-10-CM | POA: Diagnosis not present

## 2023-06-13 DIAGNOSIS — O99213 Obesity complicating pregnancy, third trimester: Secondary | ICD-10-CM

## 2023-06-13 DIAGNOSIS — Z3A34 34 weeks gestation of pregnancy: Secondary | ICD-10-CM | POA: Diagnosis not present

## 2023-06-13 DIAGNOSIS — I10 Essential (primary) hypertension: Secondary | ICD-10-CM | POA: Diagnosis not present

## 2023-06-13 DIAGNOSIS — Z3A35 35 weeks gestation of pregnancy: Secondary | ICD-10-CM | POA: Insufficient documentation

## 2023-06-13 DIAGNOSIS — Z3689 Encounter for other specified antenatal screening: Secondary | ICD-10-CM | POA: Insufficient documentation

## 2023-06-13 DIAGNOSIS — E669 Obesity, unspecified: Secondary | ICD-10-CM

## 2023-06-13 NOTE — MAU Provider Note (Signed)
History     CSN: [id number]  Arrival date and time: 06/13/23 1242   Event Date/Time   First Provider Initiated Contact with Patient 06/13/23 1410      Chief Complaint  Patient presents with   Non-stress Test   Alexandra Escobar , a  42 y.o. G3P0020 at [redacted]w[redacted]d presents to MAU for repeat BPP after getting 6/8 yesterday. 2 off for breathing. Patient denies vaginal bleeding, leaking of fluid and contractions. She reports some occasional braxton hicks but no pain. She endorses positive fetal movement.          OB History     Gravida  3   Para      Term      Preterm      AB  2   Living         SAB  2   IAB      Ectopic      Multiple  0   Live Births              Past Medical History:  Diagnosis Date   Anxiety    Depression    Endometriosis    Fatty liver    Hypertension    Hypothyroidism    PCOS (polycystic ovarian syndrome)     Past Surgical History:  Procedure Laterality Date   DILATION AND CURETTAGE OF UTERUS  06/2021   LAPAROSCOPY     for endometriosis   NASAL SEPTUM SURGERY     OTHER SURGICAL HISTORY     fibroid removal   OTHER SURGICAL HISTORY     fistula drainage    Family History  Problem Relation Age of Onset   Hypertension Father    Depression Father    Hypertension Sister    Cancer Maternal Grandmother        breast   Hypertension Paternal Grandmother     Social History   Tobacco Use   Smoking status: Never    Passive exposure: Never   Smokeless tobacco: Never  Vaping Use   Vaping status: Never Used  Substance Use Topics   Alcohol use: Never   Drug use: Never    Allergies: No Known Allergies  Medications Prior to Admission  Medication Sig Dispense Refill Last Dose   aspirin EC 81 MG tablet Take 81 mg by mouth daily. Swallow whole.   06/13/2023   furosemide (LASIX) 20 MG tablet Take 1 tablet (20 mg total) by mouth daily. 3 tablet 0 06/13/2023   labetalol (NORMODYNE) 100 MG tablet Take 3 tablets (300 mg total) by  mouth 3 (three) times daily. 140 tablet 1 06/13/2023   levothyroxine (SYNTHROID) 50 MCG tablet Take 1 tablet (50 mcg total) by mouth daily before breakfast. 90 tablet 1 06/13/2023   mirtazapine (REMERON) 15 MG tablet Take 15 mg by mouth at bedtime.   06/12/2023   NIFEdipine (ADALAT CC) 30 MG 24 hr tablet Take 1 tablet (30 mg total) by mouth 2 (two) times daily. (Patient taking differently: Take 30 mg by mouth 2 (two) times daily. Patient takes 60mg  in morning, 30mg  in evening) 60 tablet 1 06/13/2023   sertraline (ZOLOFT) 100 MG tablet Take 150 mg by mouth daily.   06/12/2023   loratadine (CLARITIN) 10 MG tablet Take 1 tablet (10 mg total) by mouth daily. 30 tablet 0 Unknown    Review of Systems  Constitutional:  Negative for chills, fatigue and fever.  Eyes:  Negative for pain and visual disturbance.  Respiratory:  Negative for  apnea, shortness of breath and wheezing.   Cardiovascular:  Negative for chest pain and palpitations.  Gastrointestinal:  Negative for abdominal pain, constipation, diarrhea, nausea and vomiting.  Genitourinary:  Negative for difficulty urinating, dysuria, pelvic pain, vaginal bleeding, vaginal discharge and vaginal pain.  Musculoskeletal:  Negative for back pain.  Neurological:  Negative for seizures, weakness and headaches.  Psychiatric/Behavioral:  Negative for suicidal ideas.    Physical Exam   Blood pressure (!) 137/91, pulse 83, temperature 98.1 F (36.7 C), temperature source Oral, resp. rate 18, height 5\' 2"  (1.575 m), weight 122.5 kg, last menstrual period 10/11/2022, SpO2 99%, unknown if currently breastfeeding.  Physical Exam Vitals and nursing note reviewed.  Constitutional:      General: She is not in acute distress.    Appearance: Normal appearance.  HENT:     Head: Normocephalic.  Pulmonary:     Effort: Pulmonary effort is normal.  Musculoskeletal:     Cervical back: Normal range of motion.  Skin:    General: Skin is warm and dry.   Neurological:     Mental Status: She is alert and oriented to person, place, and time.  Psychiatric:        Mood and Affect: Mood normal.    FHT: 135 bpm with moderate variability. Accels present no decels  Toco: None  MAU Course  Procedures Orders Placed This Encounter  Procedures   [place] MFM FETAL BPP WO NON STRESS    MDM - BPP 6/8  - NST reactive and reassuring - Call placed to Dr. Velvet Bathe, reviewed patient presentation and current clinical picture. Per MD patient stable for discharge. Recommendation to follow up in office on Monday.  - Patient agreeable to plan of care.  - Plan for discharge   Assessment and Plan   1. Non-stress test reactive   2. [redacted] weeks gestation of pregnancy   3. Chronic hypertension    - Reviewed worsening signs and symptoms and return precautions.  - Recommended to follow up with office on Monday. Patient already has appt scheduled. Encouraged to keep visit  - FHT appropriate for gestational age upon discharge.  - Patient discharged home in stable condition and may return to MAU as needed.   Claudette Head, MSN CNM  06/13/2023, 2:10 PM

## 2023-06-13 NOTE — MAU Note (Signed)
.  Alexandra Escobar is a 42 y.o. at [redacted]w[redacted]d here in MAU reporting: that the provider requested her to have another BPP with NST to f/u reassuring NST and non reactive BPP per Ledger.   Onset of complaint: 06/12/23 Pain score: 0 Vitals:   06/13/23 1315 06/13/23 1331  BP: (!) 135/90 (!) 137/91  Pulse: 88 83  Resp: 18   Temp: 98.1 F (36.7 C)   SpO2: 99%      FHT:130 Lab orders placed from triage:    none

## 2023-06-13 NOTE — MAU Note (Signed)
RN walked into pt's exam room observed pt crying.  Pt asks if there is anyway she can have someone not be in her room or notified about her pregnancy.  RN explained that it is up to her.  Pt requests that her husband, Alexandra Escobar, not be notified or informed about there pregnancy.  Pt requests placed into pink sticky note.  Pt informs RN that she is safe, it is not that type of situation just personal stuff going on at home.  Pt states that she is staying with family.  BP 164/104 as pt is beginning to cry again.  Pt reports that she needs to take her Labetalol 300 mg which she would have taken at 1500.  Pt asked if she wanted to take the dosage now.  Pt reports that she just wants to go home, eat, and rest.  Notified Dorathy Daft, CNM pt's BP and pt's request to go home.  Per provider recheck BP.  BP 154/101.  Notified provider that pt really wants to go home and pt will take her Labetalol at home.  Provider agreed.  Pt advised to please take medication as soon as she gets home, to monitor for sx's of elevated BP, and to come to MAU as indicated.  Pt verbalized understanding.   Leonette Nutting  06/13/23

## 2023-06-15 DIAGNOSIS — O09813 Supervision of pregnancy resulting from assisted reproductive technology, third trimester: Secondary | ICD-10-CM | POA: Diagnosis not present

## 2023-06-15 DIAGNOSIS — O09523 Supervision of elderly multigravida, third trimester: Secondary | ICD-10-CM | POA: Diagnosis not present

## 2023-06-15 DIAGNOSIS — O10013 Pre-existing essential hypertension complicating pregnancy, third trimester: Secondary | ICD-10-CM | POA: Diagnosis not present

## 2023-06-15 DIAGNOSIS — Z3A35 35 weeks gestation of pregnancy: Secondary | ICD-10-CM | POA: Diagnosis not present

## 2023-06-16 ENCOUNTER — Encounter: Payer: Self-pay | Admitting: Pharmacist

## 2023-06-16 ENCOUNTER — Ambulatory Visit: Payer: BC Managed Care – PPO | Attending: Internal Medicine | Admitting: Pharmacist

## 2023-06-16 VITALS — BP 149/99 | HR 88 | Wt 284.0 lb

## 2023-06-16 DIAGNOSIS — O10919 Unspecified pre-existing hypertension complicating pregnancy, unspecified trimester: Secondary | ICD-10-CM

## 2023-06-16 DIAGNOSIS — Z3A35 35 weeks gestation of pregnancy: Secondary | ICD-10-CM | POA: Diagnosis not present

## 2023-06-16 DIAGNOSIS — O1203 Gestational edema, third trimester: Secondary | ICD-10-CM

## 2023-06-16 MED ORDER — FUROSEMIDE 20 MG PO TABS: 20.0000 mg | ORAL_TABLET | Freq: Every day | ORAL | Status: DC

## 2023-06-16 MED ORDER — FUROSEMIDE 20 MG PO TABS: 20.0000 mg | ORAL_TABLET | Freq: Every day | ORAL | 0 refills | Status: DC

## 2023-06-16 NOTE — Patient Instructions (Addendum)
I have sent a refill of furosemide for you. Please take 2 tablets at first and then you can start taking 1 tablet as needed  Please continue your labetalol 300mg  three times daily Please continue your nifedipine 60mg  in morning, 30mg  in the evening  Message [place] with your blood pressure tomorrow  Laural Golden, PharmD, BCACP, CDCES, CPP [address], [address] [place], [place], [address] Phone: [phone], Fax: [phone]

## 2023-06-16 NOTE — Progress Notes (Signed)
Patient ID: Alexandra Escobar                 DOB: November 16, 1980                      MRN: [id number]     HPI: Alexandra Escobar is a 42 y.o. female referred to cardio-OB clinic for HTN management. Patient currenly 35w 3d. Scheduled for induction on 07/01/23.  Patient seen last week and very fluid overloaded. Was walking with cane and having to sleep on recliner due to SOB. Had put on 11#. Sent conservative dose of PO lasix to pharmacy with instructions to take for 3 days until f/u today. Unfortunately she went to ER before taking and Lasix and was admitted. Patient given IV Lasix 40mg  in hospital and reports she immediately felt better.   Unfortunately began to feel poorly again over weekend. SOB has returned as well as edema. Has gained 6 more pounds since last visit.   BP has also increased. Brought home cuff to check validity. Uses Omron series 7 cuff. Home cuff reading in room: 149/99, pulse 90. Due for next labetalol dose in 1 hour.  Echocardiogram on 06/05/23 showed LVEF 65-70%  Current HTN meds:  Labetalol 300mg  TID Nifedipine 60mg  in morning, 30 mg in evening  Wt Readings from Last 3 Encounters:  06/16/23 284 lb (128.8 kg)  06/13/23 270 lb (122.5 kg)  06/11/23 278 lb (126.1 kg)   BP Readings from Last 3 Encounters:  06/13/23 (!) 151/104  06/12/23 137/79  06/11/23 130/84   Pulse Readings from Last 3 Encounters:  06/13/23 97  06/12/23 88  06/11/23 88    Renal function: Estimated Creatinine Clearance: 120.4 mL/min (by C-G formula based on SCr of 0.7 mg/dL).  Past Medical History:  Diagnosis Date   Anxiety    Depression    Endometriosis    Fatty liver    Hypertension    Hypothyroidism    PCOS (polycystic ovarian syndrome)     Current Outpatient Medications on File Prior to Visit  Medication Sig Dispense Refill   aspirin EC 81 MG tablet Take 81 mg by mouth daily. Swallow whole.     furosemide (LASIX) 20 MG tablet Take 1 tablet (20 mg total) by mouth daily. 3 tablet 0    labetalol (NORMODYNE) 100 MG tablet Take 3 tablets (300 mg total) by mouth 3 (three) times daily. 140 tablet 1   levothyroxine (SYNTHROID) 50 MCG tablet Take 1 tablet (50 mcg total) by mouth daily before breakfast. 90 tablet 1   loratadine (CLARITIN) 10 MG tablet Take 1 tablet (10 mg total) by mouth daily. 30 tablet 0   mirtazapine (REMERON) 15 MG tablet Take 15 mg by mouth at bedtime.     NIFEdipine (ADALAT CC) 30 MG 24 hr tablet Take 1 tablet (30 mg total) by mouth 2 (two) times daily. (Patient taking differently: Take 30 mg by mouth 2 (two) times daily. Patient takes 60mg  in morning, 30mg  in evening) 60 tablet 1   sertraline (ZOLOFT) 100 MG tablet Take 150 mg by mouth daily.     No current facility-administered medications on file prior to visit.    No Known Allergies   Assessment/Plan:  1. Hypertension -  Patient BP in room elevated today at 148/99 and patient feels poorly again. Home cuff appears accurate and patient demonstrated proper use.  Discussed options with patient regarding edema and elevated BP. Will send more Lasix to patient's pharmacy. Advised for her to  take 2 tablets when she picks up and then 1 tablet PRN. Has appt tomorrow with OB. Patient will message [place] with BP reading from [facility] visit tomorrow. Concern patient may need to be readmitted for further diuresis if BP continues to trend upwards. Has f/u with Dr Servando Salina on 12/6.  Continue labetalol 300mg  TID Continue nifedipine 30mg  in morning, 60 mg in evening Start furosemide 40mg  x 1, then 20mg  prn edema F/u with Dr Servando Salina on 12/6  Laural Golden, PharmD, BCACP, CDCES, CPP [address] [place], [place], [address] Phone: [phone], Fax: [phone]

## 2023-06-17 ENCOUNTER — Encounter: Payer: Self-pay | Admitting: Cardiology

## 2023-06-17 ENCOUNTER — Other Ambulatory Visit: Payer: Self-pay

## 2023-06-17 ENCOUNTER — Encounter: Payer: Self-pay | Admitting: Pharmacist

## 2023-06-17 ENCOUNTER — Encounter (HOSPITAL_COMMUNITY): Payer: Self-pay | Admitting: Obstetrics and Gynecology

## 2023-06-17 ENCOUNTER — Inpatient Hospital Stay (HOSPITAL_COMMUNITY)
Admission: AD | Admit: 2023-06-17 | Discharge: 2023-06-27 | DRG: 786 | Disposition: A | Discharge status: Home / Self Care | Payer: BC Managed Care – PPO | Attending: Obstetrics and Gynecology | Admitting: Obstetrics and Gynecology

## 2023-06-17 DIAGNOSIS — O902 Hematoma of obstetric wound: Secondary | ICD-10-CM | POA: Diagnosis not present

## 2023-06-17 DIAGNOSIS — E0590 Thyrotoxicosis, unspecified without thyrotoxic crisis or storm: Secondary | ICD-10-CM

## 2023-06-17 DIAGNOSIS — O152 Eclampsia in the puerperium: Secondary | ICD-10-CM | POA: Diagnosis not present

## 2023-06-17 DIAGNOSIS — O1414 Severe pre-eclampsia complicating childbirth: Secondary | ICD-10-CM | POA: Diagnosis present

## 2023-06-17 DIAGNOSIS — O169 Unspecified maternal hypertension, unspecified trimester: Secondary | ICD-10-CM | POA: Diagnosis not present

## 2023-06-17 DIAGNOSIS — O9081 Anemia of the puerperium: Secondary | ICD-10-CM | POA: Diagnosis not present

## 2023-06-17 DIAGNOSIS — O1413 Severe pre-eclampsia, third trimester: Secondary | ICD-10-CM | POA: Diagnosis not present

## 2023-06-17 DIAGNOSIS — O9912 Other diseases of the blood and blood-forming organs and certain disorders involving the immune mechanism complicating childbirth: Secondary | ICD-10-CM | POA: Diagnosis present

## 2023-06-17 DIAGNOSIS — O9962 Diseases of the digestive system complicating childbirth: Secondary | ICD-10-CM | POA: Diagnosis present

## 2023-06-17 DIAGNOSIS — O864 Pyrexia of unknown origin following delivery: Secondary | ICD-10-CM | POA: Diagnosis not present

## 2023-06-17 DIAGNOSIS — E871 Hypo-osmolality and hyponatremia: Secondary | ICD-10-CM | POA: Diagnosis present

## 2023-06-17 DIAGNOSIS — Z79890 Hormone replacement therapy (postmenopausal): Secondary | ICD-10-CM | POA: Diagnosis not present

## 2023-06-17 DIAGNOSIS — Z7982 Long term (current) use of aspirin: Secondary | ICD-10-CM

## 2023-06-17 DIAGNOSIS — Z3A35 35 weeks gestation of pregnancy: Secondary | ICD-10-CM

## 2023-06-17 DIAGNOSIS — D72829 Elevated white blood cell count, unspecified: Secondary | ICD-10-CM | POA: Diagnosis present

## 2023-06-17 DIAGNOSIS — Z79899 Other long term (current) drug therapy: Secondary | ICD-10-CM

## 2023-06-17 DIAGNOSIS — K219 Gastro-esophageal reflux disease without esophagitis: Secondary | ICD-10-CM | POA: Diagnosis present

## 2023-06-17 DIAGNOSIS — E039 Hypothyroidism, unspecified: Secondary | ICD-10-CM | POA: Diagnosis present

## 2023-06-17 DIAGNOSIS — Z818 Family history of other mental and behavioral disorders: Secondary | ICD-10-CM

## 2023-06-17 DIAGNOSIS — I10 Essential (primary) hypertension: Secondary | ICD-10-CM | POA: Diagnosis not present

## 2023-06-17 DIAGNOSIS — E8779 Other fluid overload: Secondary | ICD-10-CM | POA: Diagnosis not present

## 2023-06-17 DIAGNOSIS — O99284 Endocrine, nutritional and metabolic diseases complicating childbirth: Secondary | ICD-10-CM | POA: Diagnosis present

## 2023-06-17 DIAGNOSIS — Z1152 Encounter for screening for COVID-19: Secondary | ICD-10-CM | POA: Diagnosis not present

## 2023-06-17 DIAGNOSIS — O1490 Unspecified pre-eclampsia, unspecified trimester: Principal | ICD-10-CM | POA: Diagnosis present

## 2023-06-17 DIAGNOSIS — Z8249 Family history of ischemic heart disease and other diseases of the circulatory system: Secondary | ICD-10-CM

## 2023-06-17 DIAGNOSIS — O1092 Unspecified pre-existing hypertension complicating childbirth: Secondary | ICD-10-CM | POA: Diagnosis present

## 2023-06-17 DIAGNOSIS — O99214 Obesity complicating childbirth: Secondary | ICD-10-CM | POA: Diagnosis present

## 2023-06-17 DIAGNOSIS — O114 Pre-existing hypertension with pre-eclampsia, complicating childbirth: Secondary | ICD-10-CM | POA: Diagnosis present

## 2023-06-17 DIAGNOSIS — I5031 Acute diastolic (congestive) heart failure: Secondary | ICD-10-CM | POA: Diagnosis not present

## 2023-06-17 DIAGNOSIS — D62 Acute posthemorrhagic anemia: Secondary | ICD-10-CM | POA: Diagnosis not present

## 2023-06-17 DIAGNOSIS — R0609 Other forms of dyspnea: Secondary | ICD-10-CM | POA: Diagnosis not present

## 2023-06-17 DIAGNOSIS — O09523 Supervision of elderly multigravida, third trimester: Secondary | ICD-10-CM | POA: Diagnosis not present

## 2023-06-17 LAB — PROTEIN / CREATININE RATIO, URINE
Creatinine, Urine: 191 mg/dL
Protein Creatinine Ratio: 4.63 mg/mg{creat} — ABNORMAL HIGH (ref 0.00–0.15)
Total Protein, Urine: 885 mg/dL

## 2023-06-17 LAB — CBC
HCT: 32 % — ABNORMAL LOW (ref 36.0–46.0)
Hemoglobin: 11.1 g/dL — ABNORMAL LOW (ref 12.0–15.0)
MCH: 32.4 pg (ref 26.0–34.0)
MCHC: 34.7 g/dL (ref 30.0–36.0)
MCV: 93.3 fL (ref 80.0–100.0)
Platelets: 285 10*3/uL (ref 150–400)
RBC: 3.43 MIL/uL — ABNORMAL LOW (ref 3.87–5.11)
RDW: 14.3 % (ref 11.5–15.5)
WBC: 16.2 10*3/uL — ABNORMAL HIGH (ref 4.0–10.5)
nRBC: 0.2 % (ref 0.0–0.2)

## 2023-06-17 LAB — COMPREHENSIVE METABOLIC PANEL
ALT: 16 U/L (ref 0–44)
AST: 22 U/L (ref 15–41)
Albumin: 2.1 g/dL — ABNORMAL LOW (ref 3.5–5.0)
Alkaline Phosphatase: 82 U/L (ref 38–126)
Anion gap: 9 (ref 5–15)
BUN: 11 mg/dL (ref 6–20)
CO2: 19 mmol/L — ABNORMAL LOW (ref 22–32)
Calcium: 8.7 mg/dL — ABNORMAL LOW (ref 8.9–10.3)
Chloride: 110 mmol/L (ref 98–111)
Creatinine, Ser: 0.79 mg/dL (ref 0.44–1.00)
GFR, Estimated: >60 mL/min (ref >60)
Glucose, Bld: 102 mg/dL — ABNORMAL HIGH (ref 70–99)
Potassium: 4 mmol/L (ref 3.5–5.1)
Sodium: 138 mmol/L (ref 135–145)
Total Bilirubin: 0.3 mg/dL (ref <1.2)
Total Protein: 5.1 g/dL — ABNORMAL LOW (ref 6.5–8.1)

## 2023-06-17 LAB — TYPE AND SCREEN
ABO/RH(D): O POS
Antibody Screen: NEGATIVE

## 2023-06-17 LAB — HIV ANTIBODY (ROUTINE TESTING W REFLEX): HIV Screen 4th Generation wRfx: NONREACTIVE

## 2023-06-17 LAB — MAGNESIUM: Magnesium: 4.1 mg/dL — ABNORMAL HIGH (ref 1.7–2.4)

## 2023-06-17 MED ORDER — OXYCODONE-ACETAMINOPHEN 5-325 MG PO TABS: 2.0000 | ORAL_TABLET | ORAL | Status: DC | PRN

## 2023-06-17 MED ORDER — MISOPROSTOL 25 MCG QUARTER TABLET
25.0000 ug | ORAL_TABLET | ORAL | Status: DC | PRN
  Administered 2023-06-17 – 2023-06-18 (×3): 25 ug via VAGINAL
  Filled 2023-06-17 (×4): qty 1

## 2023-06-17 MED ORDER — SERTRALINE HCL 50 MG PO TABS
150.0000 mg | ORAL_TABLET | Freq: Every day | ORAL | Status: DC
  Administered 2023-06-17: 150 mg via ORAL
  Filled 2023-06-17 (×2): qty 1

## 2023-06-17 MED ORDER — LEVOTHYROXINE SODIUM 25 MCG PO TABS
50.0000 ug | ORAL_TABLET | Freq: Every day | ORAL | Status: DC
  Filled 2023-06-17: qty 1

## 2023-06-17 MED ORDER — LIDOCAINE HCL (PF) 1 % IJ SOLN: 30.0000 mL | INTRAMUSCULAR | Status: DC | PRN

## 2023-06-17 MED ORDER — OXYTOCIN-SODIUM CHLORIDE 30-0.9 UT/500ML-% IV SOLN: 2.5000 [IU]/h | INTRAVENOUS | Status: DC

## 2023-06-17 MED ORDER — MIRTAZAPINE 15 MG PO TABS
15.0000 mg | ORAL_TABLET | Freq: Every day | ORAL | Status: DC
  Administered 2023-06-17: 15 mg via ORAL
  Filled 2023-06-17 (×2): qty 1

## 2023-06-17 MED ORDER — TERBUTALINE SULFATE 1 MG/ML IJ SOLN
0.2500 mg | Freq: Once | INTRAMUSCULAR | Status: DC | PRN
Start: 2023-06-17 — End: 2023-06-18

## 2023-06-17 MED ORDER — OXYTOCIN BOLUS FROM INFUSION: 333.0000 mL | Freq: Once | INTRAVENOUS | Status: DC

## 2023-06-17 MED ORDER — ONDANSETRON HCL 4 MG/2ML IJ SOLN
4.0000 mg | Freq: Four times a day (QID) | INTRAMUSCULAR | Status: DC | PRN
  Administered 2023-06-17: 4 mg via INTRAVENOUS
  Filled 2023-06-17: qty 2

## 2023-06-17 MED ORDER — LABETALOL HCL 200 MG PO TABS
300.0000 mg | ORAL_TABLET | Freq: Three times a day (TID) | ORAL | Status: DC
  Administered 2023-06-17 (×2): 300 mg via ORAL
  Filled 2023-06-17 (×3): qty 1

## 2023-06-17 MED ORDER — HYDROXYZINE HCL 50 MG PO TABS: 50.0000 mg | ORAL_TABLET | Freq: Four times a day (QID) | ORAL | Status: DC | PRN

## 2023-06-17 MED ORDER — SOD CITRATE-CITRIC ACID 500-334 MG/5ML PO SOLN
30.0000 mL | ORAL | Status: DC | PRN
  Administered 2023-06-18: 30 mL via ORAL
  Filled 2023-06-17: qty 30

## 2023-06-17 MED ORDER — PENICILLIN G POT IN DEXTROSE 60000 UNIT/ML IV SOLN
3.0000 10*6.[IU] | INTRAVENOUS | Status: DC
  Administered 2023-06-17 – 2023-06-18 (×4): 3 10*6.[IU] via INTRAVENOUS
  Filled 2023-06-17 (×4): qty 50

## 2023-06-17 MED ORDER — SODIUM CHLORIDE 0.9 % IV SOLN
5.0000 10*6.[IU] | Freq: Once | INTRAVENOUS | Status: AC
  Administered 2023-06-17: 5 10*6.[IU] via INTRAVENOUS
  Filled 2023-06-17: qty 5

## 2023-06-17 MED ORDER — LABETALOL HCL 5 MG/ML IV SOLN
40.0000 mg | INTRAVENOUS | Status: DC | PRN
  Administered 2023-06-17: 40 mg via INTRAVENOUS
  Filled 2023-06-17: qty 8

## 2023-06-17 MED ORDER — LABETALOL HCL 5 MG/ML IV SOLN: 80.0000 mg | INTRAVENOUS | Status: DC | PRN

## 2023-06-17 MED ORDER — MAGNESIUM SULFATE BOLUS VIA INFUSION
4.0000 g | Freq: Once | INTRAVENOUS | Status: AC
  Administered 2023-06-17: 4 g via INTRAVENOUS
  Filled 2023-06-17: qty 1000

## 2023-06-17 MED ORDER — LACTATED RINGERS IV SOLN: INTRAVENOUS | Status: DC

## 2023-06-17 MED ORDER — LABETALOL HCL 5 MG/ML IV SOLN
20.0000 mg | INTRAVENOUS | Status: DC | PRN
  Administered 2023-06-17: 20 mg via INTRAVENOUS
  Filled 2023-06-17: qty 4

## 2023-06-17 MED ORDER — HYDRALAZINE HCL 20 MG/ML IJ SOLN: 10.0000 mg | INTRAMUSCULAR | Status: DC | PRN

## 2023-06-17 MED ORDER — MAGNESIUM SULFATE 40 GM/1000ML IV SOLN
1.0000 g/h | INTRAVENOUS | Status: DC
  Filled 2023-06-17: qty 1000

## 2023-06-17 MED ORDER — OXYCODONE-ACETAMINOPHEN 5-325 MG PO TABS: 1.0000 | ORAL_TABLET | ORAL | Status: DC | PRN

## 2023-06-17 MED ORDER — ACETAMINOPHEN 325 MG PO TABS
650.0000 mg | ORAL_TABLET | ORAL | Status: DC | PRN
  Administered 2023-06-17: 650 mg via ORAL
  Filled 2023-06-17: qty 2

## 2023-06-17 MED ORDER — LACTATED RINGERS IV SOLN
500.0000 mL | INTRAVENOUS | Status: DC | PRN
Start: 2023-06-17 — End: 2023-06-18

## 2023-06-17 MED ORDER — NIFEDIPINE ER OSMOTIC RELEASE 30 MG PO TB24
30.0000 mg | ORAL_TABLET | Freq: Two times a day (BID) | ORAL | Status: DC
  Administered 2023-06-17: 30 mg via ORAL
  Filled 2023-06-17: qty 1

## 2023-06-17 NOTE — Progress Notes (Signed)
   06/17/23 1500  Spiritual Encounters  Type of Visit Initial  Care provided to: Patient  Conversation partners present during encounter Nurse  Referral source Patient request  Reason for visit Advance directives  OnCall Visit No   Chaplain responded to request for AD. There was no family present at bedside. Chaplain assisted patient with AD education. Patient will page chaplain when she is ready. Chaplain remains available when needed.

## 2023-06-17 NOTE — Progress Notes (Signed)
Patient reported feeling lightheaded, flushed, pre-syncopal.  This resolved spontaneously after magnesium was paused.  Mild headache present since magnesium was started. She denies chest pain, pressure, SOB.   BP (!) 148/93 (BP Location: Left Arm)   Pulse 79   Temp 97.8 F (36.6 C) (Oral)   Resp 18   Wt 128.9 kg   LMP 10/11/2022 (Approximate)   SpO2 99%   BMI 51.98 kg/m   Urine output has been adequate.  Vitals WNL. Will await mag level, restart at 1g/hr if necessary.   FHT with rare late and variable decels. Some mag effect on variability, positive accels.

## 2023-06-17 NOTE — MAU Note (Signed)
Patient states she is supposed to be an induction. This RN called Lorane Gell, MD, who states he is giving L&D Charge RN a call.

## 2023-06-17 NOTE — H&P (Signed)
OB History and Physical   Alexandra Escobar is a 42 y.o. female G3P0020 presenting for induction of labor at [redacted]w[redacted]d for preeclampsia with severe features.   P:regnancy course is notable for chronic hypertension requiring multiple antihypertensive adjustments. She has been followed by cardiology and received a dose of lasix, which improved swelling, however this has already returned.  She has gained 114 lbs during pregnancy from initial OB visit. Pregnancy problems also include AMA, hypothyroidism on levothyroxine, IVF pregnancy.  Today she reports headache, vision changes, and persistent swelling.  Multiple severe range blood pressures were noted in office today.  Throughout third trimester, she has also experienced intermittent chest pain and SOB.  She was admitted 2 weeks ago and underwent CT, echocardiogram, and cardiology consultation.   Given worsening status, induction was recommended for severe features.    OB History     Gravida  3   Para      Term      Preterm      AB  2   Living         SAB  2   IAB      Ectopic      Multiple  0   Live Births             Past Medical History:  Diagnosis Date   Anxiety    Depression    Endometriosis    Fatty liver    Hypertension    Hypothyroidism    PCOS (polycystic ovarian syndrome)    Past Surgical History:  Procedure Laterality Date   DILATION AND CURETTAGE OF UTERUS  06/2021   LAPAROSCOPY     for endometriosis   NASAL SEPTUM SURGERY     OTHER SURGICAL HISTORY     fibroid removal   OTHER SURGICAL HISTORY     fistula drainage   Family History: family history includes Cancer in her maternal grandmother; Depression in her father; Hypertension in her father, paternal grandmother, and sister. Social History:  reports that she has never smoked. She has never been exposed to tobacco smoke. She has never used smokeless tobacco. She reports that she does not drink alcohol and does not use drugs.     Maternal  Diabetes: No Genetic Screening: Normal Maternal Ultrasounds/Referrals: See HPI Fetal Ultrasounds or other Referrals:  Fetal echo WNL 03/13/23 Maternal Substance Abuse:  No Significant Maternal Medications:  Levothyroxine, procardia, labetalol, sertraline Significant Maternal Lab Results:  GBS unknown Other Comments:    Review of Systems - Patient denies fever, chills, SOB, CP, N/V/D.  History   Blood pressure (!) 145/86, pulse 85, temperature 98 F (36.7 C), temperature source Oral, resp. rate 18, weight 128.9 kg, last menstrual period 10/11/2022, unknown if currently breastfeeding. Exam Physical Exam   Gen: alert, well appearing, no distress Chest: nonlabored breathing CV: no peripheral edema Abdomen: soft, gravid  Ext: no evidence of DVT  Prenatal labs: ABO, Rh: --/--/O POS (11/27 1218) Antibody: NEG (11/27 1218) Rubella:   RPR:    HBsAg:    HIV:    GBS:     Assessment/Plan: Admit to Labor and Delivery Preeclampsia with severe features (severe range BP in office, headache, vision changes). Will monitor fluid status while on magnesium given profound swelling.  P/C ratio 4.63.  Cr 0.79.  PLT 285. LFTs wnl.  Cytotec for cervical ripening, followed by pitocin, AROM Epidural when desired    Lyn Henri 06/17/2023, 1:33 PM

## 2023-06-18 ENCOUNTER — Inpatient Hospital Stay (HOSPITAL_COMMUNITY): Payer: BC Managed Care – PPO

## 2023-06-18 ENCOUNTER — Inpatient Hospital Stay (HOSPITAL_COMMUNITY): Payer: BC Managed Care – PPO | Admitting: Anesthesiology

## 2023-06-18 ENCOUNTER — Other Ambulatory Visit: Payer: Self-pay

## 2023-06-18 ENCOUNTER — Encounter (HOSPITAL_COMMUNITY)
Admission: AD | Disposition: A | Discharge status: Home / Self Care | Payer: Self-pay | Source: Home / Self Care | Attending: Obstetrics and Gynecology

## 2023-06-18 ENCOUNTER — Encounter (HOSPITAL_COMMUNITY): Payer: Self-pay | Admitting: Obstetrics and Gynecology

## 2023-06-18 DIAGNOSIS — I5031 Acute diastolic (congestive) heart failure: Secondary | ICD-10-CM

## 2023-06-18 DIAGNOSIS — Z3A35 35 weeks gestation of pregnancy: Secondary | ICD-10-CM

## 2023-06-18 DIAGNOSIS — O1413 Severe pre-eclampsia, third trimester: Secondary | ICD-10-CM

## 2023-06-18 DIAGNOSIS — O1414 Severe pre-eclampsia complicating childbirth: Secondary | ICD-10-CM

## 2023-06-18 DIAGNOSIS — O09523 Supervision of elderly multigravida, third trimester: Secondary | ICD-10-CM

## 2023-06-18 DIAGNOSIS — O99284 Endocrine, nutritional and metabolic diseases complicating childbirth: Secondary | ICD-10-CM

## 2023-06-18 DIAGNOSIS — R0609 Other forms of dyspnea: Secondary | ICD-10-CM

## 2023-06-18 LAB — COMPREHENSIVE METABOLIC PANEL
ALT: 17 U/L (ref 0–44)
ALT: 15 U/L (ref 0–44)
AST: 25 U/L (ref 15–41)
AST: 27 U/L (ref 15–41)
Albumin: 2.2 g/dL — ABNORMAL LOW (ref 3.5–5.0)
Albumin: 1.9 g/dL — ABNORMAL LOW (ref 3.5–5.0)
Alkaline Phosphatase: 79 U/L (ref 38–126)
Alkaline Phosphatase: 72 U/L (ref 38–126)
Anion gap: 8 (ref 5–15)
Anion gap: 9 (ref 5–15)
BUN: 9 mg/dL (ref 6–20)
BUN: 11 mg/dL (ref 6–20)
CO2: 19 mmol/L — ABNORMAL LOW (ref 22–32)
CO2: 18 mmol/L — ABNORMAL LOW (ref 22–32)
Calcium: 8.1 mg/dL — ABNORMAL LOW (ref 8.9–10.3)
Calcium: 7.8 mg/dL — ABNORMAL LOW (ref 8.9–10.3)
Chloride: 101 mmol/L (ref 98–111)
Chloride: 104 mmol/L (ref 98–111)
Creatinine, Ser: 0.85 mg/dL (ref 0.44–1.00)
Creatinine, Ser: 0.83 mg/dL (ref 0.44–1.00)
GFR, Estimated: >60 mL/min (ref >60)
GFR, Estimated: >60 mL/min (ref >60)
Glucose, Bld: 89 mg/dL (ref 70–99)
Glucose, Bld: 162 mg/dL — ABNORMAL HIGH (ref 70–99)
Potassium: 4.4 mmol/L (ref 3.5–5.1)
Potassium: 4.8 mmol/L (ref 3.5–5.1)
Sodium: 128 mmol/L — ABNORMAL LOW (ref 135–145)
Sodium: 131 mmol/L — ABNORMAL LOW (ref 135–145)
Total Bilirubin: 0.5 mg/dL (ref <1.2)
Total Bilirubin: 0.2 mg/dL (ref <1.2)
Total Protein: 5.4 g/dL — ABNORMAL LOW (ref 6.5–8.1)
Total Protein: 4.9 g/dL — ABNORMAL LOW (ref 6.5–8.1)

## 2023-06-18 LAB — CBC
HCT: 35.4 % — ABNORMAL LOW (ref 36.0–46.0)
HCT: 28.1 % — ABNORMAL LOW (ref 36.0–46.0)
Hemoglobin: 12.2 g/dL (ref 12.0–15.0)
Hemoglobin: 9.8 g/dL — ABNORMAL LOW (ref 12.0–15.0)
MCH: 32.2 pg (ref 26.0–34.0)
MCH: 32.7 pg (ref 26.0–34.0)
MCHC: 34.5 g/dL (ref 30.0–36.0)
MCHC: 34.9 g/dL (ref 30.0–36.0)
MCV: 93.4 fL (ref 80.0–100.0)
MCV: 93.7 fL (ref 80.0–100.0)
Platelets: 308 10*3/uL (ref 150–400)
Platelets: 287 10*3/uL (ref 150–400)
RBC: 3.79 MIL/uL — ABNORMAL LOW (ref 3.87–5.11)
RBC: 3 MIL/uL — ABNORMAL LOW (ref 3.87–5.11)
RDW: 14.2 % (ref 11.5–15.5)
RDW: 13.8 % (ref 11.5–15.5)
WBC: 15.6 10*3/uL — ABNORMAL HIGH (ref 4.0–10.5)
WBC: 19.2 10*3/uL — ABNORMAL HIGH (ref 4.0–10.5)
nRBC: 0 % (ref 0.0–0.2)
nRBC: 0 % (ref 0.0–0.2)

## 2023-06-18 LAB — ECHOCARDIOGRAM LIMITED: Weight: 4547.2 [oz_av]

## 2023-06-18 LAB — MAGNESIUM: Magnesium: 4.3 mg/dL — ABNORMAL HIGH (ref 1.7–2.4)

## 2023-06-18 LAB — RPR: RPR Ser Ql: NONREACTIVE

## 2023-06-18 SURGERY — Surgical Case
Anesthesia: Spinal

## 2023-06-18 MED ORDER — SENNOSIDES-DOCUSATE SODIUM 8.6-50 MG PO TABS
2.0000 | ORAL_TABLET | Freq: Every day | ORAL | Status: DC
  Administered 2023-06-19 – 2023-06-26 (×7): 2 via ORAL
  Filled 2023-06-18 (×8): qty 2

## 2023-06-18 MED ORDER — MENTHOL 3 MG MT LOZG: 1.0000 | LOZENGE | OROMUCOSAL | Status: DC | PRN

## 2023-06-18 MED ORDER — ZOLPIDEM TARTRATE 5 MG PO TABS
5.0000 mg | ORAL_TABLET | Freq: Every evening | ORAL | Status: DC | PRN
Start: 2023-06-18 — End: 2023-06-27

## 2023-06-18 MED ORDER — KETOROLAC TROMETHAMINE 30 MG/ML IJ SOLN
30.0000 mg | Freq: Four times a day (QID) | INTRAMUSCULAR | Status: AC | PRN
  Administered 2023-06-18 – 2023-06-20 (×3): 30 mg via INTRAVENOUS
  Filled 2023-06-18 (×3): qty 1

## 2023-06-18 MED ORDER — METOCLOPRAMIDE HCL 5 MG/ML IJ SOLN
INTRAMUSCULAR | Status: AC
Start: 2023-06-18 — End: ?
  Filled 2023-06-18: qty 2

## 2023-06-18 MED ORDER — OXYCODONE HCL 5 MG/5ML PO SOLN: 5.0000 mg | Freq: Once | ORAL | Status: DC | PRN

## 2023-06-18 MED ORDER — ACETAMINOPHEN 10 MG/ML IV SOLN
INTRAVENOUS | Status: AC
  Filled 2023-06-18: qty 100

## 2023-06-18 MED ORDER — ONDANSETRON HCL 4 MG/2ML IJ SOLN
INTRAMUSCULAR | Status: DC | PRN
  Administered 2023-06-18: 4 mg via INTRAVENOUS

## 2023-06-18 MED ORDER — WITCH HAZEL-GLYCERIN EX PADS: 1.0000 | MEDICATED_PAD | CUTANEOUS | Status: DC | PRN

## 2023-06-18 MED ORDER — FENTANYL CITRATE (PF) 100 MCG/2ML IJ SOLN
INTRAMUSCULAR | Status: AC
  Filled 2023-06-18: qty 2

## 2023-06-18 MED ORDER — SODIUM CHLORIDE 0.9 % IV SOLN
INTRAVENOUS | Status: DC | PRN
  Administered 2023-06-18: 500 mg via INTRAVENOUS

## 2023-06-18 MED ORDER — ONDANSETRON HCL 4 MG/2ML IJ SOLN
INTRAMUSCULAR | Status: AC
  Filled 2023-06-18: qty 2

## 2023-06-18 MED ORDER — OXYCODONE HCL 5 MG PO TABS
5.0000 mg | ORAL_TABLET | ORAL | Status: DC | PRN
Start: 2023-06-18 — End: 2023-06-27
  Administered 2023-06-19: 5 mg via ORAL
  Administered 2023-06-19: 10 mg via ORAL
  Administered 2023-06-19: 5 mg via ORAL
  Administered 2023-06-20 – 2023-06-23 (×8): 10 mg via ORAL
  Administered 2023-06-23: 5 mg via ORAL
  Administered 2023-06-23 – 2023-06-25 (×7): 10 mg via ORAL
  Filled 2023-06-18 (×6): qty 2
  Filled 2023-06-18: qty 1
  Filled 2023-06-18 (×2): qty 2
  Filled 2023-06-18: qty 1
  Filled 2023-06-18 (×5): qty 2
  Filled 2023-06-18: qty 1
  Filled 2023-06-18 (×4): qty 2

## 2023-06-18 MED ORDER — SODIUM CHLORIDE 0.9 % IR SOLN: Status: DC | PRN

## 2023-06-18 MED ORDER — LEVOTHYROXINE SODIUM 25 MCG PO TABS
50.0000 ug | ORAL_TABLET | Freq: Every day | ORAL | Status: DC
  Administered 2023-06-19 – 2023-06-27 (×9): 50 ug via ORAL
  Filled 2023-06-18 (×9): qty 2

## 2023-06-18 MED ORDER — BUPIVACAINE IN DEXTROSE 0.75-8.25 % IT SOLN
INTRATHECAL | Status: DC | PRN
  Administered 2023-06-18: 1.6 mL via INTRATHECAL

## 2023-06-18 MED ORDER — LACTATED RINGERS IV SOLN: INTRAVENOUS | Status: DC | PRN

## 2023-06-18 MED ORDER — MORPHINE SULFATE (PF) 0.5 MG/ML IJ SOLN
INTRAMUSCULAR | Status: AC
  Filled 2023-06-18: qty 10

## 2023-06-18 MED ORDER — SIMETHICONE 80 MG PO CHEW
80.0000 mg | CHEWABLE_TABLET | ORAL | Status: DC | PRN
  Administered 2023-06-25: 80 mg via ORAL

## 2023-06-18 MED ORDER — SODIUM CHLORIDE 0.9 % IR SOLN
Status: DC | PRN
  Administered 2023-06-18: 1

## 2023-06-18 MED ORDER — STERILE WATER FOR IRRIGATION IR SOLN
Status: DC | PRN
  Administered 2023-06-18: 1

## 2023-06-18 MED ORDER — EPHEDRINE SULFATE (PRESSORS) 50 MG/ML IJ SOLN
INTRAMUSCULAR | Status: DC | PRN
  Administered 2023-06-18 (×4): 10 mg via INTRAVENOUS

## 2023-06-18 MED ORDER — CEFAZOLIN SODIUM-DEXTROSE 2-4 GM/100ML-% IV SOLN
INTRAVENOUS | Status: AC
  Filled 2023-06-18: qty 100

## 2023-06-18 MED ORDER — OXYTOCIN-SODIUM CHLORIDE 30-0.9 UT/500ML-% IV SOLN
2.5000 [IU]/h | INTRAVENOUS | Status: AC
Start: 2023-06-18 — End: 2023-06-19

## 2023-06-18 MED ORDER — LACTATED RINGERS IV SOLN
INTRAVENOUS | Status: AC
Start: 2023-06-18 — End: 2023-06-19

## 2023-06-18 MED ORDER — CEFAZOLIN SODIUM-DEXTROSE 2-3 GM-%(50ML) IV SOLR
INTRAVENOUS | Status: DC | PRN
  Administered 2023-06-18: 3 g via INTRAVENOUS

## 2023-06-18 MED ORDER — DEXAMETHASONE SODIUM PHOSPHATE 10 MG/ML IJ SOLN
INTRAMUSCULAR | Status: AC
  Filled 2023-06-18: qty 1

## 2023-06-18 MED ORDER — DIPHENHYDRAMINE HCL 25 MG PO CAPS: 25.0000 mg | ORAL_CAPSULE | Freq: Four times a day (QID) | ORAL | Status: DC | PRN

## 2023-06-18 MED ORDER — LABETALOL HCL 200 MG PO TABS
300.0000 mg | ORAL_TABLET | Freq: Three times a day (TID) | ORAL | Status: DC
  Administered 2023-06-18 (×2): 300 mg via ORAL
  Filled 2023-06-18 (×2): qty 1

## 2023-06-18 MED ORDER — PHENYLEPHRINE HCL-NACL 20-0.9 MG/250ML-% IV SOLN
INTRAVENOUS | Status: DC | PRN
  Administered 2023-06-18: 60 ug/min via INTRAVENOUS

## 2023-06-18 MED ORDER — DIBUCAINE (PERIANAL) 1 % EX OINT: 1.0000 | TOPICAL_OINTMENT | CUTANEOUS | Status: DC | PRN

## 2023-06-18 MED ORDER — FENTANYL CITRATE (PF) 100 MCG/2ML IJ SOLN
INTRAMUSCULAR | Status: DC | PRN
  Administered 2023-06-18: 15 ug via INTRATHECAL

## 2023-06-18 MED ORDER — FENTANYL CITRATE (PF) 100 MCG/2ML IJ SOLN
25.0000 ug | INTRAMUSCULAR | Status: DC | PRN
  Administered 2023-06-18: 25 ug via INTRAVENOUS
  Administered 2023-06-18: 50 ug via INTRAVENOUS
  Administered 2023-06-18: 25 ug via INTRAVENOUS

## 2023-06-18 MED ORDER — ACETAMINOPHEN 500 MG PO TABS
1000.0000 mg | ORAL_TABLET | Freq: Four times a day (QID) | ORAL | Status: DC
Start: 2023-06-18 — End: 2023-06-25
  Administered 2023-06-18 – 2023-06-25 (×24): 1000 mg via ORAL
  Filled 2023-06-18 (×26): qty 2

## 2023-06-18 MED ORDER — HYDROMORPHONE HCL 1 MG/ML IJ SOLN
0.2000 mg | INTRAMUSCULAR | Status: DC | PRN
Start: 2023-06-18 — End: 2023-06-27
  Administered 2023-06-18 – 2023-06-22 (×2): 0.2 mg via INTRAVENOUS
  Administered 2023-06-23 – 2023-06-24 (×3): 0.6 mg via INTRAVENOUS
  Filled 2023-06-18 (×5): qty 1

## 2023-06-18 MED ORDER — NIFEDIPINE ER OSMOTIC RELEASE 30 MG PO TB24
30.0000 mg | ORAL_TABLET | Freq: Two times a day (BID) | ORAL | Status: DC
  Administered 2023-06-18 (×2): 30 mg via ORAL
  Filled 2023-06-18 (×2): qty 1

## 2023-06-18 MED ORDER — PRENATAL MULTIVITAMIN CH
1.0000 | ORAL_TABLET | Freq: Every day | ORAL | Status: DC
  Administered 2023-06-19 – 2023-06-27 (×8): 1 via ORAL
  Filled 2023-06-18 (×8): qty 1

## 2023-06-18 MED ORDER — MAGNESIUM SULFATE 40 GM/1000ML IV SOLN
1.0000 g/h | INTRAVENOUS | Status: DC
  Administered 2023-06-18: 1 g/h via INTRAVENOUS
  Filled 2023-06-18: qty 1000

## 2023-06-18 MED ORDER — COCONUT OIL OIL: 1.0000 | TOPICAL_OIL | Status: DC | PRN

## 2023-06-18 MED ORDER — MIRTAZAPINE 15 MG PO TABS
15.0000 mg | ORAL_TABLET | Freq: Every day | ORAL | Status: DC
  Administered 2023-06-18 – 2023-06-26 (×9): 15 mg via ORAL
  Filled 2023-06-18 (×11): qty 1

## 2023-06-18 MED ORDER — SIMETHICONE 80 MG PO CHEW
80.0000 mg | CHEWABLE_TABLET | Freq: Three times a day (TID) | ORAL | Status: DC
  Administered 2023-06-18 – 2023-06-27 (×23): 80 mg via ORAL
  Filled 2023-06-18 (×25): qty 1

## 2023-06-18 MED ORDER — ACETAMINOPHEN 10 MG/ML IV SOLN
1000.0000 mg | Freq: Once | INTRAVENOUS | Status: DC | PRN
  Administered 2023-06-18: 1000 mg via INTRAVENOUS

## 2023-06-18 MED ORDER — MORPHINE SULFATE (PF) 0.5 MG/ML IJ SOLN
INTRAMUSCULAR | Status: DC | PRN
  Administered 2023-06-18: 150 ug via INTRATHECAL

## 2023-06-18 MED ORDER — KETOROLAC TROMETHAMINE 30 MG/ML IJ SOLN
INTRAMUSCULAR | Status: AC
  Filled 2023-06-18: qty 1

## 2023-06-18 MED ORDER — SERTRALINE HCL 50 MG PO TABS
150.0000 mg | ORAL_TABLET | Freq: Every day | ORAL | Status: DC
  Administered 2023-06-18 – 2023-06-26 (×9): 150 mg via ORAL
  Filled 2023-06-18 (×9): qty 3

## 2023-06-18 MED ORDER — OXYCODONE HCL 5 MG PO TABS: 5.0000 mg | ORAL_TABLET | Freq: Once | ORAL | Status: DC | PRN

## 2023-06-18 MED ORDER — KETOROLAC TROMETHAMINE 30 MG/ML IJ SOLN
30.0000 mg | Freq: Once | INTRAMUSCULAR | Status: AC | PRN
  Administered 2023-06-18: 30 mg via INTRAVENOUS

## 2023-06-18 MED ORDER — FENTANYL CITRATE (PF) 100 MCG/2ML IJ SOLN
50.0000 ug | INTRAMUSCULAR | Status: DC | PRN
  Administered 2023-06-18 (×2): 50 ug via INTRAVENOUS
  Filled 2023-06-18 (×2): qty 2

## 2023-06-18 MED ORDER — FUROSEMIDE 10 MG/ML IJ SOLN
40.0000 mg | Freq: Once | INTRAMUSCULAR | Status: AC
  Administered 2023-06-18: 40 mg via INTRAVENOUS
  Filled 2023-06-18: qty 4

## 2023-06-18 MED ORDER — OXYTOCIN 10 UNIT/ML IJ SOLN
INTRAMUSCULAR | Status: AC
Start: 2023-06-18 — End: ?
  Filled 2023-06-18: qty 1

## 2023-06-18 MED ORDER — OXYTOCIN-SODIUM CHLORIDE 30-0.9 UT/500ML-% IV SOLN
INTRAVENOUS | Status: DC | PRN
  Administered 2023-06-18: 999 mL/h via INTRAVENOUS
  Administered 2023-06-18: 500 mL/h via INTRAVENOUS

## 2023-06-18 SURGICAL SUPPLY — 33 items
BENZOIN TINCTURE PRP APPL 2/3 (GAUZE/BANDAGES/DRESSINGS) IMPLANT
CHLORAPREP W/TINT 26 (MISCELLANEOUS) ×2 IMPLANT
CLAMP UMBILICAL CORD (MISCELLANEOUS) ×1 IMPLANT
CLOTH BEACON ORANGE TIMEOUT ST (SAFETY) ×1 IMPLANT
DERMABOND ADVANCED .7 DNX12 (GAUZE/BANDAGES/DRESSINGS) IMPLANT
DRESSING PREVENA PLUS CUSTOM (GAUZE/BANDAGES/DRESSINGS) IMPLANT
DRSG OPSITE POSTOP 4X10 (GAUZE/BANDAGES/DRESSINGS) ×1 IMPLANT
DRSG PREVENA PLUS CUSTOM (GAUZE/BANDAGES/DRESSINGS) ×1 IMPLANT
ELECT REM PT RETURN 9FT ADLT (ELECTROSURGICAL) ×1 IMPLANT
ELECTRODE REM PT RTRN 9FT ADLT (ELECTROSURGICAL) ×1 IMPLANT
EXTRACTOR VACUUM M CUP 4 TUBE (SUCTIONS) IMPLANT
GLOVE BIO SURGEON STRL SZ7.5 (GLOVE) ×1 IMPLANT
GLOVE BIOGEL PI IND STRL 7.0 (GLOVE) ×1 IMPLANT
GOWN SRG XL 47XLVL 4 REINF (GOWN DISPOSABLE) ×1 IMPLANT
GOWN STRL REUS W/TWL LRG LVL3 (GOWN DISPOSABLE) ×1 IMPLANT
KIT ABG SYR 3ML LUER SLIP (SYRINGE) ×1 IMPLANT
MAT PREVALON FULL STRYKER (MISCELLANEOUS) IMPLANT
NDL HYPO 25X5/8 SAFETYGLIDE (NEEDLE) ×1 IMPLANT
NEEDLE HYPO 25X5/8 SAFETYGLIDE (NEEDLE) ×1 IMPLANT
NS IRRIG 1000ML POUR BTL (IV SOLUTION) ×1 IMPLANT
PACK C SECTION WH (CUSTOM PROCEDURE TRAY) ×1 IMPLANT
PAD OB MATERNITY 4.3X12.25 (PERSONAL CARE ITEMS) ×1 IMPLANT
RETAINER VISCERAL (MISCELLANEOUS) IMPLANT
STRIP CLOSURE SKIN 1/2X4 (GAUZE/BANDAGES/DRESSINGS) IMPLANT
SUT MNCRL 0 VIOLET CTX 36 (SUTURE) ×4 IMPLANT
SUT PDS AB 0 CTX 60 (SUTURE) ×1 IMPLANT
SUT PLAIN 0 NONE (SUTURE) IMPLANT
SUT PLAIN 2 0 XLH (SUTURE) IMPLANT
SUT PLAIN ABS 2-0 CT1 27XMFL (SUTURE) IMPLANT
SUT VIC AB 4-0 KS 27 (SUTURE) ×1 IMPLANT
TOWEL OR 17X24 6PK STRL BLUE (TOWEL DISPOSABLE) ×1 IMPLANT
TRAY FOLEY W/BAG SLVR 14FR LF (SET/KITS/TRAYS/PACK) ×1 IMPLANT
WATER STERILE IRR 1000ML POUR (IV SOLUTION) ×1 IMPLANT

## 2023-06-18 NOTE — Anesthesia Preprocedure Evaluation (Signed)
Anesthesia Evaluation  Patient identified by MRN, date of birth, ID band Patient awake    Reviewed: Allergy & Precautions, NPO status , Patient's Chart, lab work & pertinent test results  History of Anesthesia Complications Negative for: history of anesthetic complications  Airway Mallampati: III  TM Distance: >3 FB Neck ROM: Full    Dental  (+) Dental Advisory Given   Pulmonary shortness of breath, with exertion and lying   breath sounds clear to auscultation       Cardiovascular hypertension (cHTN with SIPE),  Rhythm:Regular Rate:Normal  TTE 06/05/2023: IMPRESSIONS    1. Left ventricular ejection fraction, by estimation, is 65 to 70%. The  left ventricle has normal function. The left ventricle has no regional  wall motion abnormalities. Left ventricular diastolic parameters were  normal.   2. Right ventricular systolic function is normal. The right ventricular  size is normal. There is normal pulmonary artery systolic pressure. The  estimated right ventricular systolic pressure is 23.4 mmHg.   3. The mitral valve is normal in structure. Trivial mitral valve  regurgitation. No evidence of mitral stenosis.   4. The aortic valve is normal in structure. Aortic valve regurgitation is  not visualized. No aortic stenosis is present. Aortic valve area, by VTI  measures 1.91 cm. Aortic valve mean gradient measures 7.3 mmHg. Aortic  valve Vmax measures 1.86 m/s.   5. The inferior vena cava is normal in size with greater than 50%  respiratory variability, suggesting right atrial pressure of 3 mmHg.     Neuro/Psych  PSYCHIATRIC DISORDERS Anxiety Depression    negative neurological ROS     GI/Hepatic negative GI ROS,,,Fatty liver   Endo/Other  Hypothyroidism    Renal/GU      Musculoskeletal   Abdominal  (+) + obese  Peds  Hematology   Anesthesia Other Findings Concern for pulmonary edema given swelling and SOB when  moving and lying flat. SpO2 100% on RA.   Reproductive/Obstetrics (+) Pregnancy PCOS, endometriosis                              Anesthesia Physical Anesthesia Plan  ASA: 3  Anesthesia Plan: Spinal   Post-op Pain Management:    Induction:   PONV Risk Score and Plan: 2 and Treatment may vary due to age or medical condition, Ondansetron and Dexamethasone  Airway Management Planned: Natural Airway  Additional Equipment:   Intra-op Plan:   Post-operative Plan:   Informed Consent: I have reviewed the patients History and Physical, chart, labs and discussed the procedure including the risks, benefits and alternatives for the proposed anesthesia with the patient or authorized representative who has indicated his/her understanding and acceptance.       Plan Discussed with: CRNA and Anesthesiologist  Anesthesia Plan Comments: (I have discussed risks of neuraxial anesthesia including but not limited to infection, bleeding, nerve injury, back pain, headache, seizures, and failure of block. Patient denies bleeding disorders and is not currently anticoagulated. Labs have been reviewed. Risks and benefits discussed. All patient's questions answered.  )         Anesthesia Quick Evaluation

## 2023-06-18 NOTE — Progress Notes (Signed)
Echocardiogram 2D Echocardiogram has been performed.  Warren Lacy Fia Hebert RDCS 06/18/2023, 11:54 AM

## 2023-06-18 NOTE — Progress Notes (Signed)
SOB if supine, feeling some UCs No HA, no CP  Today's Vitals   06/18/23 0600 06/18/23 0601 06/18/23 0700 06/18/23 0701  BP: (!) 144/91 (!) 144/91  (!) 158/85  Pulse: 76 76  79  Resp: 19  18   Temp:      TempSrc:      SpO2: 97%  98%   Weight:      PainSc: 5   5     Body mass index is 51.98 kg/m.   Lungs-diminished BS at bases posteriorly Diffuse edema, thighs are hard with edema Cx closed/thick/high/post  She is uncomfortable when supine for cx check  Magnesium infusing @ 1gm/hr  A/P: Severe preeclampsia         I am concerned she is developing pulmonary edema. She is many hours away from a vaginal delivery. I do not think she can tolerate the length of that process.         I recommend C/S. D/W procedure and risks including infection, organ damage, bleeding/transfusion-HIV/Hep, DVT/PE, pneumonia, wound breakdown. She states she understands and agrees.         D/W anesthesiologist

## 2023-06-18 NOTE — Progress Notes (Signed)
More comfortable, no SOB Tolerating regular diet, no flatus yet  Today's Vitals   06/18/23 1439 06/18/23 1545 06/18/23 1630 06/18/23 1737  BP: 130/85 (!) 146/81    Pulse: 86 89    Resp: 17 18 17    Temp:      TempSrc:      SpO2:      Weight:      PainSc: 5    4    Body mass index is 51.98 kg/m.   UO good after Lasix  Magnesium sulfate 1 gm/hr infusing  Results for orders placed or performed during the hospital encounter of 06/17/23 (from the past 24 hour(s))  Magnesium     Status: Abnormal   Collection Time: 06/17/23  9:43 PM  Result Value Ref Range   Magnesium 4.1 (H) 1.7 - 2.4 mg/dL  CBC     Status: Abnormal   Collection Time: 06/18/23  7:48 AM  Result Value Ref Range   WBC 15.6 (H) 4.0 - 10.5 K/uL   RBC 3.79 (L) 3.87 - 5.11 MIL/uL   Hemoglobin 12.2 12.0 - 15.0 g/dL   HCT 40.9 (L) 81.1 - 91.4 %   MCV 93.4 80.0 - 100.0 fL   MCH 32.2 26.0 - 34.0 pg   MCHC 34.5 30.0 - 36.0 g/dL   RDW 78.2 95.6 - 21.3 %   Platelets 308 150 - 400 K/uL   nRBC 0.0 0.0 - 0.2 %  Comprehensive metabolic panel     Status: Abnormal   Collection Time: 06/18/23  7:48 AM  Result Value Ref Range   Sodium 128 (L) 135 - 145 mmol/L   Potassium 4.4 3.5 - 5.1 mmol/L   Chloride 101 98 - 111 mmol/L   CO2 19 (L) 22 - 32 mmol/L   Glucose, Bld 89 70 - 99 mg/dL   BUN 9 6 - 20 mg/dL   Creatinine, Ser 0.86 0.44 - 1.00 mg/dL   Calcium 8.1 (L) 8.9 - 10.3 mg/dL   Total Protein 5.4 (L) 6.5 - 8.1 g/dL   Albumin 2.2 (L) 3.5 - 5.0 g/dL   AST 25 15 - 41 U/L   ALT 17 0 - 44 U/L   Alkaline Phosphatase 79 38 - 126 U/L   Total Bilirubin 0.5 <1.2 mg/dL   GFR, Estimated >57 >84 mL/min   Anion gap 8 5 - 15  CBC     Status: Abnormal   Collection Time: 06/18/23  5:32 PM  Result Value Ref Range   WBC 19.2 (H) 4.0 - 10.5 K/uL   RBC 3.00 (L) 3.87 - 5.11 MIL/uL   Hemoglobin 9.8 (L) 12.0 - 15.0 g/dL   HCT 69.6 (L) 29.5 - 28.4 %   MCV 93.7 80.0 - 100.0 fL   MCH 32.7 26.0 - 34.0 pg   MCHC 34.9 30.0 - 36.0 g/dL   RDW  13.2 44.0 - 10.2 %   Platelets 287 150 - 400 K/uL   nRBC 0.0 0.0 - 0.2 %  Comprehensive metabolic panel     Status: Abnormal   Collection Time: 06/18/23  5:32 PM  Result Value Ref Range   Sodium 131 (L) 135 - 145 mmol/L   Potassium 4.8 3.5 - 5.1 mmol/L   Chloride 104 98 - 111 mmol/L   CO2 18 (L) 22 - 32 mmol/L   Glucose, Bld 162 (H) 70 - 99 mg/dL   BUN 11 6 - 20 mg/dL   Creatinine, Ser 7.25 0.44 - 1.00 mg/dL   Calcium 7.8 (L) 8.9 -  10.3 mg/dL   Total Protein 4.9 (L) 6.5 - 8.1 g/dL   Albumin 1.9 (L) 3.5 - 5.0 g/dL   AST 27 15 - 41 U/L   ALT 15 0 - 44 U/L   Alkaline Phosphatase 72 38 - 126 U/L   Total Bilirubin 0.2 <1.2 mg/dL   GFR, Estimated >40 >98 mL/min   Anion gap 9 5 - 15  Magnesium     Status: Abnormal   Collection Time: 06/18/23  5:32 PM  Result Value Ref Range   Magnesium 4.3 (H) 1.7 - 2.4 mg/dL     A/P: Stable         IV LR @ 50 ml/hr         D/C magnesium in am         Toradol prn

## 2023-06-18 NOTE — Op Note (Signed)
Alexandra Escobar, Alexandra Escobar MEDICAL RECORD NO: [id number] ACCOUNT NO: [id number] DATE OF BIRTH: 1981/02/22 FACILITY: [facility] LOCATION: [id number] PHYSICIAN: Guy Sandifer. Arleta Creek, MD  Operative Report   DATE OF PROCEDURE: 06/18/2023  PREOPERATIVE DIAGNOSES: 1.  Intrauterine pregnancy at 35 and 5/7 weeks. 2.  Severe preeclampsia. 3.  Failed induction of labor.  POSTOPERATIVE DIAGNOSES: 1.  Intrauterine pregnancy at 35 and 5/7 weeks. 2.  Severe preeclampsia. 3.  Failed induction of labor.  PROCEDURE: Low transverse cesarean section.  SURGEON:  Hulan Fess, MD  ASSISTANT:  Mittie Bodo, MD  An experienced assistant was required given the standard of surgical care given the complexity of the case.  This assistant was needed for exposure, dissection, suctioning, retraction, instrument exchange, assisting with delivery with administration of fundal pressure, and for overall help during the procedure    ANESTHESIA:  Spinal.  ESTIMATED BLOOD LOSS:  175 mL  FINDINGS:  Viable female infant.  Apgars of 4 and 9.  Birth weight and arterial cord pH pending.  SPECIMENS:  Placenta to pathology.  INDICATIONS AND CONSENT:  This patient is a 42 year old G3, P1 with severe preeclampsia. She has marked edema. She is being induced for severe range preeclampsia.  She has had 5 Cytotec applications. Cervix remains closed, thick, high, and posterior.  While sitting, she is not short of breath and has oxygen saturation of 98% to 100% on room air. However, when she is supine, she is quite uncomfortable finding it difficult to breathe. The edema has been getting worse. Due to the anticipated length of  the induction beyond this point as well as my concern, she could be developing pulmonary edema.  I recommend cesarean section delivery. She agrees. Potential risks and complications were discussed preoperatively including, but not limited to infection,  organ damage, bleeding requiring transfusion of blood products with HIV  and hepatitis acquisition, DVT, PE, pneumonia, and wound breakdown. The patient states she understands and agrees and consent was signed and on the chart.   DESCRIPTION OF PROCEDURE:  The patient was taken to the operating room where she was identified and a spinal anesthetic was placed per anesthesiology. She was then placed in the dorsal supine position with a 15-degree left lateral wedge. Fetal heart  tones were auscultated within the normal range. She was then prepped vaginally with Betadine. Foley catheter was placed and she was prepped abdominally with ChloraPrep. Timeout was done. After a 3-minute drying time, she was draped in a sterile fashion.  She does receive intravenous Ancef and azithromycin. Skin was entered through a Pfannenstiel incision and dissection was carried out in layers to the peritoneum, which was taken down superiorly and inferiorly. Vesicouterine peritoneum was dissected  downward. The uterus was then incised in a low transverse manner and the uterine cavity was entered bluntly with a hemostat. Clear fluid was noted. The incision is extended bilaterally with the fingers. The baby is then delivered from the vertex position  without difficulty. Loose nuchal cord x1 is noted. Cord is clamped and cut and the baby is handed to the waiting pediatrics team. Placenta is then delivered and sent to Pathology. Uterine cavity was cleaned. Uterus was closed in 2 running locking  imbricating layers of 0 Monocryl suture, which achieved good hemostasis. Lavage was carried out. Anterior peritoneum was closed in a running fashion with #0 Monocryl suture with care being taken not to pick up any underlying structures. The same suture  was also used to reapproximate the pyramidalis muscle in the midline. The  anterior rectus fascia was closed in a running fashion with a 0 looped PDS taking generous bites. The subcutaneous layer was closed with 0 plane and the skin was closed in a  subcuticular fashion  with 4-0 Vicryl and a Keith needle. A wound VAC was then applied. All counts were correct. The patient was taken to the recovery room in stable condition.     PAA D: 06/18/2023 10:00:06 am T: 06/18/2023 10:18:00 am  JOB: [id number]/ [id number]

## 2023-06-18 NOTE — Anesthesia Procedure Notes (Signed)
Spinal  Patient location during procedure: OR Start time: 06/18/2023 8:38 AM End time: 06/18/2023 8:41 AM Reason for block: surgical anesthesia Staffing Performed: anesthesiologist  Anesthesiologist: [name], MD Performed by: [name], MD Authorized by: [name], MD   Preanesthetic Checklist Completed: patient identified, IV checked, site marked, risks and benefits discussed, surgical consent, monitors and equipment checked, pre-op evaluation and timeout performed Spinal Block Patient position: sitting Prep: DuraPrep Patient monitoring: blood pressure and continuous pulse ox Approach: midline Location: L3-4 Injection technique: single-shot Needle Needle type: Pencan  Needle gauge: 24 G Needle length: 9 cm Assessment Sensory level: T4 Additional Notes Risks and benefits of neuraxial anesthesia including, but not limited to, infection, bleeding, local anesthetic toxicity, headache, hypotension, back pain, block failure, etc. were discussed with the patient. The patient expressed understanding and consented to the procedure. I confirmed that the patient has no bleeding disorders and is not taking blood thinners. I confirmed the patient's last platelet count with the nurse. Monitors were applied. A time-out was performed immediately prior to the procedure. Sterile technique was used throughout the whole procedure.   1 attempt(s)
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
## 2023-06-18 NOTE — Anesthesia Postprocedure Evaluation (Signed)
Anesthesia Post Note  Patient: Alexandra Escobar  Procedure(s) Performed: CESAREAN SECTION     Patient location during evaluation: PACU Anesthesia Type: Spinal Level of consciousness: awake Pain management: pain level controlled Vital Signs Assessment: post-procedure vital signs reviewed and stable Respiratory status: spontaneous breathing, respiratory function stable and nonlabored ventilation Cardiovascular status: blood pressure returned to baseline and stable Postop Assessment: no headache, no backache and no apparent nausea or vomiting Anesthetic complications: no   No notable events documented.  Last Vitals:  Vitals:   06/18/23 1100 06/18/23 1115  BP: 133/82 137/88  Pulse: 87 89  Resp: 20 17  Temp: 36.6 C   SpO2: 95% 93%    Last Pain:  Vitals:   06/18/23 1149  TempSrc:   PainSc: 2                  [name]

## 2023-06-18 NOTE — Brief Op Note (Signed)
06/17/2023 - 06/18/2023  9:53 AM  PATIENT:  Alexandra Escobar  42 y.o. female  PRE-OPERATIVE DIAGNOSIS:  Severe preeclampsia                                                       Failed induction of labor  POST-OPERATIVE DIAGNOSIS:  Severe preeclampsia                                                       Failed induction of labor  PROCEDURE:  Procedure(s): CESAREAN SECTION (N/A)  SURGEON:  Surgeons and Role:    * Harold Hedge, MD - Primary    * Hessie Dibble, MD - Assisting  PHYSICIAN ASSISTANT:   ASSISTANTS:   ANESTHESIA:   spinal  EBL:  175 ml   BLOOD ADMINISTERED:none  DRAINS: Urinary Catheter (Foley)   LOCAL MEDICATIONS USED:    SPECIMEN:  Source of Specimen:  placenta  DISPOSITION OF SPECIMEN:  PATHOLOGY  COUNTS:  YES  TOURNIQUET:  * No tourniquets in log *  DICTATION: .Other Dictation: Dictation Number [id number]  PLAN OF CARE: Admit to inpatient   PATIENT DISPOSITION:  PACU - hemodynamically stable.   Delay start of Pharmacological VTE agent (>24hrs) due to surgical blood loss or risk of bleeding: not applicable

## 2023-06-18 NOTE — Lactation Note (Signed)
This note was copied from a baby's chart. Lactation Consultation Note  Patient Name: Alexandra Escobar OVFIE'P Date: 06/18/2023 Age:40 hours Reason for consult: Initial assessment;Maternal endocrine disorder;Primapara;1st time breastfeeding;Other (Comment);Infant < 6lbs;Late-preterm 34-36.6wks (AMA, IVF)  Visited with family of 6 hours old LPI female; Alexandra Escobar is a P1 and reported (+) breast changes during the pregnancy. Baby "Alexandra Escobar" has already latched at the breast when she was in recovery, she has also taken bottles with 15 and 12 ml of donor milk. Assisted with breast massage, hand expression and set her up with a DEBP and provided a pumping band in size XL for hands on pumping. Reviewed normal LPI behavior, feeding cues, size of baby's stomach, lactogenesis II, pumping schedule, pumping log, LPI policy, supplementation and anticipatory guidelines.  Maternal Data Has patient been taught Hand Expression?: Yes Does the patient have breastfeeding experience prior to this delivery?: No  Feeding Mother's Current Feeding Choice: Breast Milk and Donor Milk Nipple Type: Slow - flow  Lactation Tools Discussed/Used Tools: Pump;Flanges;Coconut oil;Hands-free pumping top Flange Size: 21 Breast pump type: Double-Electric Breast Pump Pump Education: Setup, frequency, and cleaning;Milk Storage Reason for Pumping: LPI Pumping frequency: set up DEBP at 5 hours post-partum, initiated pumping during LC consult  Interventions Interventions: Breast feeding basics reviewed;Skin to skin;Coconut oil;DEBP;Education;LC Services brochure;LPT handout/interventions  Plan of care Encouraged to put baby to breast +8 times/24 hours or sooner if feeding cues are present She'll start pumping every 3 hours after feedings/attempts at the breast or whenever baby is getting a bottle; ideally 8 pumping sessions/24 hours Family will continue supplementing with EBM/donor milk per LPI policy  Auntie present and very  supportive, she engaged in STS care while Alexandra Escobar was fitting Alexandra Escobar with a pumping band. All questions and concerns answered, family to contact [facility] services PRN.  Discharge Pump: Hands Free (Mom Cozy through insurance)  Consult Status Consult Status: Follow-up Date: 06/19/23 Follow-up type: In-patient   Alexandra Escobar 06/18/2023, 3:47 PM

## 2023-06-18 NOTE — Transfer of Care (Signed)
Immediate Anesthesia Transfer of Care Note  Patient: Alexandra Escobar  Procedure(s) Performed: CESAREAN SECTION  Patient Location: PACU  Anesthesia Type:Spinal  Level of Consciousness: awake, alert , and oriented  Airway & Oxygen Therapy: Patient Spontanous Breathing  Post-op Assessment: Report given to RN and Post -op Vital signs reviewed and stable  Post vital signs: Reviewed and stable  Last Vitals:  Vitals Value Taken Time  BP 130/76 06/18/23 1015  Temp    Pulse 91 06/18/23 1023  Resp 20 06/18/23 1023  SpO2 94 % 06/18/23 1023  Vitals shown include unfiled device data.  Last Pain:  Vitals:   06/18/23 0720  TempSrc:   PainSc: 6          Complications: No notable events documented.

## 2023-06-19 DIAGNOSIS — I5031 Acute diastolic (congestive) heart failure: Secondary | ICD-10-CM | POA: Diagnosis not present

## 2023-06-19 LAB — COMPREHENSIVE METABOLIC PANEL
ALT: 13 U/L (ref 0–44)
AST: 24 U/L (ref 15–41)
Albumin: 1.7 g/dL — ABNORMAL LOW (ref 3.5–5.0)
Alkaline Phosphatase: 60 U/L (ref 38–126)
Anion gap: 6 (ref 5–15)
BUN: 13 mg/dL (ref 6–20)
CO2: 21 mmol/L — ABNORMAL LOW (ref 22–32)
Calcium: 7.3 mg/dL — ABNORMAL LOW (ref 8.9–10.3)
Chloride: 103 mmol/L (ref 98–111)
Creatinine, Ser: 0.86 mg/dL (ref 0.44–1.00)
GFR, Estimated: >60 mL/min (ref >60)
Glucose, Bld: 105 mg/dL — ABNORMAL HIGH (ref 70–99)
Potassium: 4.5 mmol/L (ref 3.5–5.1)
Sodium: 130 mmol/L — ABNORMAL LOW (ref 135–145)
Total Bilirubin: 0.2 mg/dL (ref <1.2)
Total Protein: 4.4 g/dL — ABNORMAL LOW (ref 6.5–8.1)

## 2023-06-19 LAB — CULTURE, BETA STREP (GROUP B ONLY)

## 2023-06-19 LAB — CBC
HCT: 23.1 % — ABNORMAL LOW (ref 36.0–46.0)
Hemoglobin: 7.9 g/dL — ABNORMAL LOW (ref 12.0–15.0)
MCH: 32.5 pg (ref 26.0–34.0)
MCHC: 34.2 g/dL (ref 30.0–36.0)
MCV: 95.1 fL (ref 80.0–100.0)
Platelets: 230 10*3/uL (ref 150–400)
RBC: 2.43 MIL/uL — ABNORMAL LOW (ref 3.87–5.11)
RDW: 14.1 % (ref 11.5–15.5)
WBC: 15.6 10*3/uL — ABNORMAL HIGH (ref 4.0–10.5)
nRBC: 0 % (ref 0.0–0.2)

## 2023-06-19 LAB — MAGNESIUM: Magnesium: 3.9 mg/dL — ABNORMAL HIGH (ref 1.7–2.4)

## 2023-06-19 MED ORDER — LABETALOL HCL 100 MG PO TABS
100.0000 mg | ORAL_TABLET | Freq: Three times a day (TID) | ORAL | Status: DC
  Filled 2023-06-19: qty 1

## 2023-06-19 MED ORDER — LABETALOL HCL 100 MG PO TABS
100.0000 mg | ORAL_TABLET | Freq: Three times a day (TID) | ORAL | Status: DC
  Administered 2023-06-20 (×2): 100 mg via ORAL
  Filled 2023-06-19 (×2): qty 1

## 2023-06-19 MED ORDER — POTASSIUM CHLORIDE CRYS ER 20 MEQ PO TBCR
20.0000 meq | EXTENDED_RELEASE_TABLET | Freq: Every day | ORAL | Status: AC
  Administered 2023-06-19 – 2023-06-20 (×2): 20 meq via ORAL
  Filled 2023-06-19 (×2): qty 1

## 2023-06-19 MED ORDER — LORAZEPAM 2 MG/ML IJ SOLN: 1.0000 mg | Freq: Once | INTRAMUSCULAR | Status: AC

## 2023-06-19 MED ORDER — LORAZEPAM 1 MG PO TABS
1.0000 mg | ORAL_TABLET | Freq: Once | ORAL | Status: AC
  Administered 2023-06-19: 1 mg via ORAL
  Filled 2023-06-19: qty 1

## 2023-06-19 MED ORDER — NIFEDIPINE ER OSMOTIC RELEASE 30 MG PO TB24: 30.0000 mg | ORAL_TABLET | Freq: Every day | ORAL | Status: DC

## 2023-06-19 MED ORDER — FUROSEMIDE 10 MG/ML IJ SOLN
40.0000 mg | Freq: Every day | INTRAMUSCULAR | Status: AC
  Administered 2023-06-19 – 2023-06-20 (×2): 40 mg via INTRAVENOUS
  Filled 2023-06-19 (×2): qty 4

## 2023-06-19 MED ORDER — LABETALOL HCL 200 MG PO TABS
200.0000 mg | ORAL_TABLET | Freq: Three times a day (TID) | ORAL | Status: DC
  Administered 2023-06-19: 200 mg via ORAL
  Filled 2023-06-19: qty 1

## 2023-06-19 MED ORDER — FAMOTIDINE 20 MG PO TABS
20.0000 mg | ORAL_TABLET | Freq: Two times a day (BID) | ORAL | Status: DC
  Filled 2023-06-19 (×4): qty 1

## 2023-06-19 NOTE — Social Work (Signed)
MOB was referred for history of depression/anxiety.  * Referral screened out by Clinical Social Worker because none of the following criteria appear to apply:  ~ History of anxiety/depression during this pregnancy, or of post-partum depression following prior delivery.  ~ Diagnosis of anxiety and/or depression within last 3 years OR * MOB's symptoms currently being treated with medication and/or therapy. Per chart review MOB is currently prescribed and taking Zoloft 150mg  and Remeron 15mg .  Please contact the Clinical Social Worker if needs arise, by [facility] request, or if MOB scores greater than 9/yes to question 10 on Edinburgh Postpartum Depression Screen.  Wende Neighbors, LCSWA Clinical Social Worker [phone]

## 2023-06-19 NOTE — Progress Notes (Signed)
POD #1 Tolerating regular diet, passing flatus, good pain relief Was dizzy on her feet this am, passed small clots>no further bleeding No SOB  Today's Vitals   06/19/23 0300 06/19/23 0317 06/19/23 0605 06/19/23 0618  BP: (!) 104/58 101/61 127/70 116/68  Pulse: 74 76 82 85  Resp:      Temp:      TempSrc:      SpO2:      Weight:      PainSc:       Body mass index is 51.98 kg/m.   UO 600 ml over last 2 hours  Abdomen soft, BS+ Wound vac in place  Results for orders placed or performed during the hospital encounter of 06/17/23 (from the past 24 hour(s))  CBC     Status: Abnormal   Collection Time: 06/18/23  7:48 AM  Result Value Ref Range   WBC 15.6 (H) 4.0 - 10.5 K/uL   RBC 3.79 (L) 3.87 - 5.11 MIL/uL   Hemoglobin 12.2 12.0 - 15.0 g/dL   HCT 95.6 (L) 21.3 - 08.6 %   MCV 93.4 80.0 - 100.0 fL   MCH 32.2 26.0 - 34.0 pg   MCHC 34.5 30.0 - 36.0 g/dL   RDW 57.8 46.9 - 62.9 %   Platelets 308 150 - 400 K/uL   nRBC 0.0 0.0 - 0.2 %  Comprehensive metabolic panel     Status: Abnormal   Collection Time: 06/18/23  7:48 AM  Result Value Ref Range   Sodium 128 (L) 135 - 145 mmol/L   Potassium 4.4 3.5 - 5.1 mmol/L   Chloride 101 98 - 111 mmol/L   CO2 19 (L) 22 - 32 mmol/L   Glucose, Bld 89 70 - 99 mg/dL   BUN 9 6 - 20 mg/dL   Creatinine, Ser 5.28 0.44 - 1.00 mg/dL   Calcium 8.1 (L) 8.9 - 10.3 mg/dL   Total Protein 5.4 (L) 6.5 - 8.1 g/dL   Albumin 2.2 (L) 3.5 - 5.0 g/dL   AST 25 15 - 41 U/L   ALT 17 0 - 44 U/L   Alkaline Phosphatase 79 38 - 126 U/L   Total Bilirubin 0.5 <1.2 mg/dL   GFR, Estimated >41 >32 mL/min   Anion gap 8 5 - 15  CBC     Status: Abnormal   Collection Time: 06/18/23  5:32 PM  Result Value Ref Range   WBC 19.2 (H) 4.0 - 10.5 K/uL   RBC 3.00 (L) 3.87 - 5.11 MIL/uL   Hemoglobin 9.8 (L) 12.0 - 15.0 g/dL   HCT 44.0 (L) 10.2 - 72.5 %   MCV 93.7 80.0 - 100.0 fL   MCH 32.7 26.0 - 34.0 pg   MCHC 34.9 30.0 - 36.0 g/dL   RDW 36.6 44.0 - 34.7 %   Platelets 287  150 - 400 K/uL   nRBC 0.0 0.0 - 0.2 %  Comprehensive metabolic panel     Status: Abnormal   Collection Time: 06/18/23  5:32 PM  Result Value Ref Range   Sodium 131 (L) 135 - 145 mmol/L   Potassium 4.8 3.5 - 5.1 mmol/L   Chloride 104 98 - 111 mmol/L   CO2 18 (L) 22 - 32 mmol/L   Glucose, Bld 162 (H) 70 - 99 mg/dL   BUN 11 6 - 20 mg/dL   Creatinine, Ser 4.25 0.44 - 1.00 mg/dL   Calcium 7.8 (L) 8.9 - 10.3 mg/dL   Total Protein 4.9 (L) 6.5 - 8.1 g/dL  Albumin 1.9 (L) 3.5 - 5.0 g/dL   AST 27 15 - 41 U/L   ALT 15 0 - 44 U/L   Alkaline Phosphatase 72 38 - 126 U/L   Total Bilirubin 0.2 <1.2 mg/dL   GFR, Estimated >16 >10 mL/min   Anion gap 9 5 - 15  Magnesium     Status: Abnormal   Collection Time: 06/18/23  5:32 PM  Result Value Ref Range   Magnesium 4.3 (H) 1.7 - 2.4 mg/dL  CBC     Status: Abnormal   Collection Time: 06/19/23  4:38 AM  Result Value Ref Range   WBC 15.6 (H) 4.0 - 10.5 K/uL   RBC 2.43 (L) 3.87 - 5.11 MIL/uL   Hemoglobin 7.9 (L) 12.0 - 15.0 g/dL   HCT 96.0 (L) 45.4 - 09.8 %   MCV 95.1 80.0 - 100.0 fL   MCH 32.5 26.0 - 34.0 pg   MCHC 34.2 30.0 - 36.0 g/dL   RDW 11.9 14.7 - 82.9 %   Platelets 230 150 - 400 K/uL   nRBC 0.0 0.0 - 0.2 %  Comprehensive metabolic panel     Status: Abnormal   Collection Time: 06/19/23  4:38 AM  Result Value Ref Range   Sodium 130 (L) 135 - 145 mmol/L   Potassium 4.5 3.5 - 5.1 mmol/L   Chloride 103 98 - 111 mmol/L   CO2 21 (L) 22 - 32 mmol/L   Glucose, Bld 105 (H) 70 - 99 mg/dL   BUN 13 6 - 20 mg/dL   Creatinine, Ser 5.62 0.44 - 1.00 mg/dL   Calcium 7.3 (L) 8.9 - 10.3 mg/dL   Total Protein 4.4 (L) 6.5 - 8.1 g/dL   Albumin 1.7 (L) 3.5 - 5.0 g/dL   AST 24 15 - 41 U/L   ALT 13 0 - 44 U/L   Alkaline Phosphatase 60 38 - 126 U/L   Total Bilirubin 0.2 <1.2 mg/dL   GFR, Estimated >13 >08 mL/min   Anion gap 6 5 - 15  Magnesium     Status: Abnormal   Collection Time: 06/19/23  4:38 AM  Result Value Ref Range   Magnesium 3.9 (H) 1.7 -  2.4 mg/dL     A/P: Severe preeclampsia-magnesium now off         HTN-BPs lower. Labetalol now 200mg  TID                                    Will decrease nifedipine XL to 30 mg po every day         PO anemia - D/W patient. Will track. If she becomes symptomatic with persistent dizziness will consider transfusion.         Cardiology to round today- appreciate their assistance

## 2023-06-19 NOTE — Lactation Note (Signed)
This note was copied from a baby's chart. Lactation Consultation Note  Patient Name: Alexandra Escobar [id number] Date: 06/19/2023 Age:40 hours Reason for consult: Follow-up assessment;Maternal endocrine disorder;Late-preterm 34-36.6wks;Primapara;1st time breastfeeding;Other (Comment);Infant < 6lbs (AMA, IVF)  Visited with family of 40 hours old LPI female; Alexandra Escobar is a P1 and reports she's tried taking baby "Alexandra Escobar" to breast today but she just gets too sleepy and won't latch, when [facility] RN Ray[place] Citizen assisted with the last feeding at the breast,  baby won't even opened her mouth. Family reported that she's doing well with the bottle though waking up to feed on her own and taking the goal volumes per age in hours. Alexandra Escobar has also been pumping but not getting any drops yet, she hasn't been able to do it consistently due to being so tired and recovering from her C/S. Explained the importance of consistent pumping for the onset of lactogenesis II and to protect her supply, and praised her for all her efforts. This LC offered latch assistance but baby just finished a 20 ml of DM 24 calorie via bottle. Asked her to call for latch assistance when needed. Reviewed supplementation and anticipatory guidelines.  Feeding Mother's Current Feeding Choice: Breast Milk and Donor Milk  Lactation Tools Discussed/Used Tools: Pump;Flanges;Coconut oil;Hands-free pumping top Flange Size: 21 Breast pump type: Double-Electric Breast Pump Pump Education: Setup, frequency, and cleaning;Milk Storage Reason for Pumping: LPI < 5 lbs Pumping frequency: 4 times/24 hours; recommended pumping q 3 hours Pumped volume: 0 mL  Interventions Interventions: Breast feeding basics reviewed;Coconut oil;DEBP;Education  Plan of care Encouraged to continue putting baby to breast +8 times/24 hours or sooner if feeding cues are present She'll start pumping every 3 hours after feedings/attempts at the breast or whenever baby is getting a  bottle; ideally 8 pumping sessions/24 hours Family will continue supplementing with EBM/donor milk per LPI policy   Auntie present and very supportive. All questions and concerns answered, family to contact [facility] services PRN.  Consult Status Consult Status: Follow-up Date: 06/20/23 Follow-up type: In-patient   Alexandra Escobar Alexandra Escobar 06/19/2023, 2:11 PM

## 2023-06-19 NOTE — Progress Notes (Signed)
Progress Note  Patient Name: Alexandra Escobar Date of Encounter: 06/19/2023  Primary Cardiologist: Thomasene Ripple, DO   Subjective   Patient seen and examined at her bedside. Feels she is doing well.  Inpatient Medications    Scheduled Meds:  acetaminophen  1,000 mg Oral Q6H   famotidine  20 mg Oral BID   labetalol  200 mg Oral TID   levothyroxine  50 mcg Oral QAC breakfast   mirtazapine  15 mg Oral QHS   NIFEdipine  30 mg Oral Q2200   prenatal multivitamin  1 tablet Oral Q1200   senna-docusate  2 tablet Oral Daily   sertraline  150 mg Oral Daily   simethicone  80 mg Oral TID PC   Continuous Infusions:  lactated ringers 75 mL/hr at 06/19/23 0044   PRN Meds: coconut oil, witch hazel-glycerin **AND** dibucaine, diphenhydrAMINE, HYDROmorphone (DILAUDID) injection, ketorolac, menthol-cetylpyridinium, oxyCODONE, simethicone, zolpidem   Vital Signs    Vitals:   06/19/23 0317 06/19/23 0605 06/19/23 0618 06/19/23 0726  BP: 101/61 127/70 116/68 132/77  Pulse: 76 82 85 81  Resp:    18  Temp:    97.7 F (36.5 C)  TempSrc:    Oral  SpO2:    98%  Weight:        Intake/Output Summary (Last 24 hours) at 06/19/2023 0830 Last data filed at 06/19/2023 0731 Gross per 24 hour  Intake 1621.35 ml  Output 3300 ml  Net -1678.65 ml   Filed Weights   06/17/23 1130  Weight: 128.9 kg    Telemetry    Not on tele - Personally Reviewed  ECG    None  - Personally Reviewed  Physical Exam   General: Comfortable, overall fluid overloaded Head: Atraumatic, normal size  Eyes: PEERLA, EOMI  Neck: Supple, normal JVD Cardiac: Normal S1, S2; RRR; no murmurs, rubs, or gallops Lungs: Clear to auscultation bilaterally Abd: Soft, nontender, no hepatomegaly  Ext: warm, +2 LE edema  Musculoskeletal: No deformities, BUE and BLE strength normal and equal Skin: Warm and dry, no rashes   Neuro: Alert and oriented to person, place, time, and situation, CNII-XII grossly intact, no focal deficits   Psych: Normal mood and affect   Labs    Chemistry Recent Labs  Lab 06/18/23 0748 06/18/23 1732 06/19/23 0438  NA 128* 131* 130*  K 4.4 4.8 4.5  CL 101 104 103  CO2 19* 18* 21*  GLUCOSE 89 162* 105*  BUN 9 11 13   CREATININE 0.85 0.83 0.86  CALCIUM 8.1* 7.8* 7.3*  PROT 5.4* 4.9* 4.4*  ALBUMIN 2.2* 1.9* 1.7*  AST 25 27 24   ALT 17 15 13   ALKPHOS 79 72 60  BILITOT 0.5 0.2 0.2  GFRNONAA >60 >60 >60  ANIONGAP 8 9 6      Hematology Recent Labs  Lab 06/18/23 0748 06/18/23 1732 06/19/23 0438  WBC 15.6* 19.2* 15.6*  RBC 3.79* 3.00* 2.43*  HGB 12.2 9.8* 7.9*  HCT 35.4* 28.1* 23.1*  MCV 93.4 93.7 95.1  MCH 32.2 32.7 32.5  MCHC 34.5 34.9 34.2  RDW 14.2 13.8 14.1  PLT 308 287 230    Cardiac EnzymesNo results for input(s): "TROPONINI" in the last 168 hours. No results for input(s): "TROPIPOC" in the last 168 hours.   BNPNo results for input(s): "BNP", "PROBNP" in the last 168 hours.   DDimer No results for input(s): "DDIMER" in the last 168 hours.   Radiology    ECHOCARDIOGRAM LIMITED  Result Date: 06/18/2023  ECHOCARDIOGRAM LIMITED REPORT   Patient Name:   Alexandra Escobar Date of Exam: 06/18/2023 Medical Rec #:  [id number]   Height:       62.0 in Accession #:    [id number]  Weight:       284.2 lb Date of Birth:  08-14-1980   BSA:          2.220 m Patient Age:    42 years    BP:           137/88 mmHg Patient Gender: F           HR:           78 bpm. Exam Location:  Inpatient Procedure: Limited Echo, Color Doppler and Cardiac Doppler Indications:    R06.9 DOE, pulmonary edema  History:        Patient has prior history of Echocardiogram examinations, most                 recent 06/05/2023. Risk Factors:Hypertension.  Sonographer:    Irving Burton Senior RDCS Referring Phys: [id number] [facility] NICOLE DUKE  Sonographer Comments: Limited immediately post cesarean for pre-ecclampsia, checking for effusion and LV function IMPRESSIONS  1. Limited Echo to evaluate for LVEF and pericardial effusion.   2. Left ventricular ejection fraction, by estimation, is 60 to 65%. The left ventricle has normal function. The left ventricle has no regional wall motion abnormalities. Left ventricular diastolic function could not be evaluated.  3. Right ventricular systolic function is normal. The right ventricular size is normal.  4. The inferior vena cava is normal in size with greater than 50% respiratory variability, suggesting right atrial pressure of 3 mmHg.  5. No pericardial effusion. FINDINGS  Left Ventricle: Left ventricular ejection fraction, by estimation, is 60 to 65%. The left ventricle has normal function. The left ventricle has no regional wall motion abnormalities. The left ventricular internal cavity size was normal in size. Left ventricular diastolic function could not be evaluated. Right Ventricle: The right ventricular size is normal. Right ventricular systolic function is normal. Left Atrium: Left atrial size was not assessed. Right Atrium: Right atrial size was not assessed. Pericardium: There is no evidence of pericardial effusion. Venous: The inferior vena cava is normal in size with greater than 50% respiratory variability, suggesting right atrial pressure of 3 mmHg. Additional Comments: Spectral Doppler performed. Color Doppler performed.  Vishnu Priya Mallipeddi Electronically signed by Winfield Rast Mallipeddi Signature Date/Time: 06/18/2023/12:07:30 PM    Final    DG CHEST PORT 1 VIEW  Result Date: 06/18/2023 CLINICAL DATA:  [id number] Pulmonary edema [id number] EXAM: PORTABLE CHEST 1 VIEW COMPARISON:  CXR 06/12/23 FINDINGS: No pleural effusion. No pneumothorax. Cardiomegaly. Focal airspace opacity. No radiographically apparent displaced rib fractures. Visualized upper abdomen is unremarkable. Prominent bilateral interstitial opacities favored represent pulmonary venous congestion without overt pulmonary edema. IMPRESSION: Cardiomegaly and pulmonary venous congestion. No evidence of overt pulmonary  edema. Electronically Signed   By: Lorenza Cambridge M.D.   On: 06/18/2023 11:35    Cardiac Studies     Patient Profile     42 y.o. female with PCOS, chronic hypertension in pregnancy, preclampsia  with severe eclampsia now post C-section experiencing acute diastolic heart failrure  Assessment & Plan    Acute diastolic heart failure  Severe Eclampsia  Morbid obesity   Recovering from recent C-section and eclampsia.   Still clinically with fluid overload consistent with severe eclampsia complicated by Acute diastolic heart failure.  I agree with decreasing the nifedipine since  blood pressure was marginal yesterday. At the same time she still need diuretics, will give Lasix 40 mg IV for today and tomorrow. She is at a negative balance today -1678 ml  OK to titrate off nifedipine if blood pressure is marginal after Lasix.  I would prefer to have the Lasix on the avoid worsening heart failure.  She plans to breastfeed - will be cautious to support make sure the diuretics does not impact breast milk production but she needs it.   If still fluid overloaded at the time of discharge please send home with Lasix 20 mg x 3 days and KCL supplement. This will be important because she is at high risk for postpartum readmission - will in fact be disruptive to the success of breast feed and mom and baby bonding. No signs of postpartum depression.    If can help it , please don't continue with lactated ringers  I do not see mag levels will add on today's lab. Hyponatremic will monitor.  LFTs normal, HGB 7.9 - if this does not improve tomorrow will benefit from iron infusion or PRBC will defer to the [facility] team for this.  I discussed with the patient we will help her get set up for sleep study in the outpatient - no need to make a specific referral to sleep medicine.    She has a follow up in our cardio-obstetrics clinic with me on 06/26/2023.     For questions or updates, please contact [facility] Please consult [email or web address] for contact info under Cardiology/STEMI.      Signed, Shaquel Josephson, DO  06/19/2023, 8:30 AM

## 2023-06-19 NOTE — Progress Notes (Signed)
CTSP re: sys BPs in 90s  Patient supine in bed, holding baby. She is concerned by her BP and "maybe a little dizzy" on/off. She was sitting up in bed. Also some reflux  Today's Vitals   06/18/23 2058 06/18/23 2134 06/18/23 2356 06/19/23 0021  BP:   (!) 95/52 (!) 96/45  Pulse:   79   Resp:   16   Temp:   97.7 F (36.5 C)   TempSrc:   Oral   SpO2:   95%   Weight:      PainSc: 5  Asleep     Body mass index is 51.98 kg/m.   UO 50-100 ml/hr, dilute  Last BP 101/54, P 70s reg O2 sats 98-100% RA  Lungs CTA DTR arms 2+  A/P: She received a dose of labetalol 300mg  and nifedipine XL 30 mg @ 2200. Will decrease labetalol to 200mg  TID.         D/C magnesium and continue IV fluids @ 75 ml/hr         Pepcid po prn

## 2023-06-20 DIAGNOSIS — I5031 Acute diastolic (congestive) heart failure: Secondary | ICD-10-CM | POA: Diagnosis not present

## 2023-06-20 DIAGNOSIS — O169 Unspecified maternal hypertension, unspecified trimester: Secondary | ICD-10-CM

## 2023-06-20 DIAGNOSIS — Z3A35 35 weeks gestation of pregnancy: Secondary | ICD-10-CM | POA: Diagnosis not present

## 2023-06-20 LAB — CBC
HCT: 22.6 % — ABNORMAL LOW (ref 36.0–46.0)
Hemoglobin: 7.6 g/dL — ABNORMAL LOW (ref 12.0–15.0)
MCH: 32.3 pg (ref 26.0–34.0)
MCHC: 33.6 g/dL (ref 30.0–36.0)
MCV: 96.2 fL (ref 80.0–100.0)
Platelets: 270 10*3/uL (ref 150–400)
RBC: 2.35 MIL/uL — ABNORMAL LOW (ref 3.87–5.11)
RDW: 14.8 % (ref 11.5–15.5)
WBC: 15.9 10*3/uL — ABNORMAL HIGH (ref 4.0–10.5)
nRBC: 0.4 % — ABNORMAL HIGH (ref 0.0–0.2)

## 2023-06-20 LAB — COMPREHENSIVE METABOLIC PANEL
ALT: 14 U/L (ref 0–44)
AST: 23 U/L (ref 15–41)
Albumin: 1.9 g/dL — ABNORMAL LOW (ref 3.5–5.0)
Alkaline Phosphatase: 63 U/L (ref 38–126)
Anion gap: 6 (ref 5–15)
BUN: 11 mg/dL (ref 6–20)
CO2: 22 mmol/L (ref 22–32)
Calcium: 8.3 mg/dL — ABNORMAL LOW (ref 8.9–10.3)
Chloride: 110 mmol/L (ref 98–111)
Creatinine, Ser: 0.82 mg/dL (ref 0.44–1.00)
GFR, Estimated: >60 mL/min (ref >60)
Glucose, Bld: 91 mg/dL (ref 70–99)
Potassium: 4.2 mmol/L (ref 3.5–5.1)
Sodium: 138 mmol/L (ref 135–145)
Total Bilirubin: 0.3 mg/dL (ref <1.2)
Total Protein: 4.8 g/dL — ABNORMAL LOW (ref 6.5–8.1)

## 2023-06-20 MED ORDER — LORAZEPAM 1 MG PO TABS
1.0000 mg | ORAL_TABLET | Freq: Once | ORAL | Status: AC
  Administered 2023-06-20: 1 mg via ORAL
  Filled 2023-06-20: qty 1

## 2023-06-20 MED ORDER — LABETALOL HCL 200 MG PO TABS
200.0000 mg | ORAL_TABLET | Freq: Three times a day (TID) | ORAL | Status: DC
  Administered 2023-06-20 – 2023-06-21 (×3): 200 mg via ORAL
  Filled 2023-06-20 (×3): qty 1

## 2023-06-20 MED ORDER — SODIUM CHLORIDE 0.9 % IV SOLN
500.0000 mg | Freq: Once | INTRAVENOUS | Status: DC
  Filled 2023-06-20: qty 25

## 2023-06-20 MED ORDER — SODIUM CHLORIDE 0.9 % IV SOLN
510.0000 mg | Freq: Once | INTRAVENOUS | Status: AC
  Administered 2023-06-20: 510 mg via INTRAVENOUS
  Filled 2023-06-20: qty 17

## 2023-06-20 MED ORDER — IRON SUCROSE 500 MG IVPB - SIMPLE MED
500.0000 mg | Freq: Once | INTRAVENOUS | Status: DC
  Filled 2023-06-20: qty 275

## 2023-06-20 NOTE — Progress Notes (Signed)
POD #2  Tolerating regular diet, passing flatus Pain over incision when rising from bed No dizziness today with trips to BR Edema less in her hands No SOB  Today's Vitals   06/20/23 0306 06/20/23 0307 06/20/23 0739 06/20/23 0805  BP:  (!) 152/69 (!) 151/83   Pulse:  (!) 104 95   Resp:  19 18   Temp:  98 F (36.7 C) 98.4 F (36.9 C)   TempSrc:  Oral Oral   SpO2: 97%  98%   Weight:      PainSc:    8    Body mass index is 51.98 kg/m.   Abdomen soft, BS+ Wound vac C&D, no output LE- 3+ edema over thighs  Results for orders placed or performed during the hospital encounter of 06/17/23 (from the past 24 hour(s))  CBC     Status: Abnormal   Collection Time: 06/20/23  4:21 AM  Result Value Ref Range   WBC 15.9 (H) 4.0 - 10.5 K/uL   RBC 2.35 (L) 3.87 - 5.11 MIL/uL   Hemoglobin 7.6 (L) 12.0 - 15.0 g/dL   HCT 16.1 (L) 09.6 - 04.5 %   MCV 96.2 80.0 - 100.0 fL   MCH 32.3 26.0 - 34.0 pg   MCHC 33.6 30.0 - 36.0 g/dL   RDW 40.9 81.1 - 91.4 %   Platelets 270 150 - 400 K/uL   nRBC 0.4 (H) 0.0 - 0.2 %  Comprehensive metabolic panel     Status: Abnormal   Collection Time: 06/20/23  4:21 AM  Result Value Ref Range   Sodium 138 135 - 145 mmol/L   Potassium 4.2 3.5 - 5.1 mmol/L   Chloride 110 98 - 111 mmol/L   CO2 22 22 - 32 mmol/L   Glucose, Bld 91 70 - 99 mg/dL   BUN 11 6 - 20 mg/dL   Creatinine, Ser 7.82 0.44 - 1.00 mg/dL   Calcium 8.3 (L) 8.9 - 10.3 mg/dL   Total Protein 4.8 (L) 6.5 - 8.1 g/dL   Albumin 1.9 (L) 3.5 - 5.0 g/dL   AST 23 15 - 41 U/L   ALT 14 0 - 44 U/L   Alkaline Phosphatase 63 38 - 126 U/L   Total Bilirubin 0.3 <1.2 mg/dL   GFR, Estimated >95 >62 mL/min   Anion gap 6 5 - 15     A/P: Severe preeclampsia-S/P magnesium sulfate         Post op- good bowel/bladder function         HTN-labetalol 100mg  q8 hours. BP drifitng upward. Will monitor today and adjust medication prn         Edema - IV lasix 40mg  today done         Anemia - D/W options of IV iron vs  transfusion PRBCs. She is up to BR without sxs. Will order Venofer.         Remove wound vac. She requests Pepcid to be D/C.

## 2023-06-20 NOTE — Lactation Note (Signed)
This note was copied from a baby's chart. Lactation Consultation Note  Patient Name: Alexandra Escobar [id number] Date: 06/20/2023 Age:40 hours Reason for consult: Follow-up assessment;NICU baby;Late-preterm 34-36.6wks LC was f/u w/mom about pumping. Mom is pumping some but not on a schedule. Baby went to NICU recently. Discussed w/mom about pumping Q 3hr. Discussed milk storage for NICU babies as well. Mom stated she is starting to get some clear colostrum. Praised mom. Encouraged if mom has questions to call.   Maternal Data    Feeding Mother's Current Feeding Choice: Breast Milk and Donor Milk  LATCH Score                    Lactation Tools Discussed/Used Breast pump type: Double-Electric Breast Pump  Interventions    Discharge    Consult Status Consult Status: Follow-up Date: 06/21/23 Follow-up type: In-patient    Charyl Dancer 06/20/2023, 9:02 PM

## 2023-06-20 NOTE — Progress Notes (Signed)
Progress Note  Patient Name: Alexandra Escobar Date of Encounter: 06/20/2023  Primary Cardiologist:   Thomasene Ripple, DO   Subjective   Nauseated.  No SOB   Inpatient Medications    Scheduled Meds:  acetaminophen  1,000 mg Oral Q6H   labetalol  100 mg Oral Q8H   levothyroxine  50 mcg Oral QAC breakfast   mirtazapine  15 mg Oral QHS   prenatal multivitamin  1 tablet Oral Q1200   senna-docusate  2 tablet Oral Daily   sertraline  150 mg Oral Daily   simethicone  80 mg Oral TID PC   Continuous Infusions:  PRN Meds: coconut oil, witch hazel-glycerin **AND** dibucaine, diphenhydrAMINE, HYDROmorphone (DILAUDID) injection, ketorolac, menthol-cetylpyridinium, oxyCODONE, simethicone, zolpidem   Vital Signs    Vitals:   06/20/23 0302 06/20/23 0306 06/20/23 0307 06/20/23 0739  BP: (!) 158/69  (!) 152/69 (!) 151/83  Pulse: (!) 110  (!) 104 95  Resp:   19 18  Temp:   98 F (36.7 C) 98.4 F (36.9 C)  TempSrc:   Oral Oral  SpO2:  97%  98%  Weight:        Intake/Output Summary (Last 24 hours) at 06/20/2023 1108 Last data filed at 06/20/2023 0600 Gross per 24 hour  Intake 840 ml  Output 4175 ml  Net -3335 ml   Filed Weights   06/17/23 1130  Weight: 128.9 kg    Telemetry    NA - Personally Reviewed  ECG    NA - Personally Reviewed  Physical Exam   GEN: No acute distress.   Neck: No  JVD Cardiac: RRR, no murmurs, rubs, or gallops.  Respiratory: Clear  to auscultation bilaterally. GI: Soft, nontender, non-distended  MS: Moderate leg  edema; No deformity. Neuro:  Nonfocal  Psych: Normal affect   Labs    Chemistry Recent Labs  Lab 06/18/23 1732 06/19/23 0438 06/20/23 0421  NA 131* 130* 138  K 4.8 4.5 4.2  CL 104 103 110  CO2 18* 21* 22  GLUCOSE 162* 105* 91  BUN 11 13 11   CREATININE 0.83 0.86 0.82  CALCIUM 7.8* 7.3* 8.3*  PROT 4.9* 4.4* 4.8*  ALBUMIN 1.9* 1.7* 1.9*  AST 27 24 23   ALT 15 13 14   ALKPHOS 72 60 63  BILITOT 0.2 0.2 0.3  GFRNONAA >60  >60 >60  ANIONGAP 9 6 6      Hematology Recent Labs  Lab 06/18/23 1732 06/19/23 0438 06/20/23 0421  WBC 19.2* 15.6* 15.9*  RBC 3.00* 2.43* 2.35*  HGB 9.8* 7.9* 7.6*  HCT 28.1* 23.1* 22.6*  MCV 93.7 95.1 96.2  MCH 32.7 32.5 32.3  MCHC 34.9 34.2 33.6  RDW 13.8 14.1 14.8  PLT 287 230 270    Cardiac EnzymesNo results for input(s): "TROPONINI" in the last 168 hours. No results for input(s): "TROPIPOC" in the last 168 hours.   BNPNo results for input(s): "BNP", "PROBNP" in the last 168 hours.   DDimer No results for input(s): "DDIMER" in the last 168 hours.   Radiology    ECHOCARDIOGRAM LIMITED  Result Date: 06/18/2023    ECHOCARDIOGRAM LIMITED REPORT   Patient Name:   Alexandra Escobar Date of Exam: 06/18/2023 Medical Rec #:  [id number]   Height:       62.0 in Accession #:    [id number]  Weight:       284.2 lb Date of Birth:  11/19/1980   BSA:          2.220 m  Patient Age:    42 years    BP:           137/88 mmHg Patient Gender: F           HR:           78 bpm. Exam Location:  Inpatient Procedure: Limited Echo, Color Doppler and Cardiac Doppler Indications:    R06.9 DOE, pulmonary edema  History:        Patient has prior history of Echocardiogram examinations, most                 recent 06/05/2023. Risk Factors:Hypertension.  Sonographer:    Irving Burton Senior RDCS Referring Phys: [id number] [facility] [place] NICOLE DUKE  Sonographer Comments: Limited immediately post cesarean for pre-ecclampsia, checking for effusion and LV function IMPRESSIONS  1. Limited Echo to evaluate for LVEF and pericardial effusion.  2. Left ventricular ejection fraction, by estimation, is 60 to 65%. The left ventricle has normal function. The left ventricle has no regional wall motion abnormalities. Left ventricular diastolic function could not be evaluated.  3. Right ventricular systolic function is normal. The right ventricular size is normal.  4. The inferior vena cava is normal in size with greater than 50% respiratory variability,  suggesting right atrial pressure of 3 mmHg.  5. No pericardial effusion. FINDINGS  Left Ventricle: Left ventricular ejection fraction, by estimation, is 60 to 65%. The left ventricle has normal function. The left ventricle has no regional wall motion abnormalities. The left ventricular internal cavity size was normal in size. Left ventricular diastolic function could not be evaluated. Right Ventricle: The right ventricular size is normal. Right ventricular systolic function is normal. Left Atrium: Left atrial size was not assessed. Right Atrium: Right atrial size was not assessed. Pericardium: There is no evidence of pericardial effusion. Venous: The inferior vena cava is normal in size with greater than 50% respiratory variability, suggesting right atrial pressure of 3 mmHg. Additional Comments: Spectral Doppler performed. Color Doppler performed.  Vishnu Priya Mallipeddi Electronically signed by Winfield Rast Mallipeddi Signature Date/Time: 06/18/2023/12:07:30 PM    Final    DG CHEST PORT 1 VIEW  Result Date: 06/18/2023 CLINICAL DATA:  [id number] Pulmonary edema [id number] EXAM: PORTABLE CHEST 1 VIEW COMPARISON:  CXR 06/12/23 FINDINGS: No pleural effusion. No pneumothorax. Cardiomegaly. Focal airspace opacity. No radiographically apparent displaced rib fractures. Visualized upper abdomen is unremarkable. Prominent bilateral interstitial opacities favored represent pulmonary venous congestion without overt pulmonary edema. IMPRESSION: Cardiomegaly and pulmonary venous congestion. No evidence of overt pulmonary edema. Electronically Signed   By: Lorenza Cambridge M.D.   On: 06/18/2023 11:35    Cardiac Studies   Echo see above.   Patient Profile     42 y.o. female PCOS, chronic hypertension in pregnancy, preclampsia with severe eclampsia now post C-section experiencing acute diastolic heart failrure   Assessment & Plan    Acute diastolic HF:  Eclampsia.  Diuresed with net negative at least 4.7 liters since  admission.   Given IV Lasix today.   Will reassess in the AM.        C section:  Per Dr. Henderson Cloud  HTN:   Nifedipine is off.  Increase labetalol to 200 tid as BP increases.    Hyponatremia:  Improved.    For questions or updates, please contact [facility] Please consult [email or web address] for contact info under Cardiology/STEMI.   Signed, Rollene Rotunda, MD  06/20/2023, 11:08 AM

## 2023-06-21 DIAGNOSIS — I5031 Acute diastolic (congestive) heart failure: Secondary | ICD-10-CM | POA: Diagnosis not present

## 2023-06-21 DIAGNOSIS — O169 Unspecified maternal hypertension, unspecified trimester: Secondary | ICD-10-CM

## 2023-06-21 DIAGNOSIS — I10 Essential (primary) hypertension: Secondary | ICD-10-CM | POA: Diagnosis not present

## 2023-06-21 DIAGNOSIS — O99284 Endocrine, nutritional and metabolic diseases complicating childbirth: Secondary | ICD-10-CM | POA: Diagnosis present

## 2023-06-21 DIAGNOSIS — Z1152 Encounter for screening for COVID-19: Secondary | ICD-10-CM | POA: Diagnosis not present

## 2023-06-21 DIAGNOSIS — O9912 Other diseases of the blood and blood-forming organs and certain disorders involving the immune mechanism complicating childbirth: Secondary | ICD-10-CM | POA: Diagnosis present

## 2023-06-21 DIAGNOSIS — O99214 Obesity complicating childbirth: Secondary | ICD-10-CM | POA: Diagnosis present

## 2023-06-21 DIAGNOSIS — E039 Hypothyroidism, unspecified: Secondary | ICD-10-CM | POA: Diagnosis present

## 2023-06-21 DIAGNOSIS — E871 Hypo-osmolality and hyponatremia: Secondary | ICD-10-CM | POA: Diagnosis present

## 2023-06-21 DIAGNOSIS — O114 Pre-existing hypertension with pre-eclampsia, complicating childbirth: Secondary | ICD-10-CM | POA: Diagnosis present

## 2023-06-21 DIAGNOSIS — O902 Hematoma of obstetric wound: Secondary | ICD-10-CM | POA: Diagnosis not present

## 2023-06-21 DIAGNOSIS — O152 Eclampsia in the puerperium: Secondary | ICD-10-CM | POA: Diagnosis not present

## 2023-06-21 DIAGNOSIS — O1092 Unspecified pre-existing hypertension complicating childbirth: Secondary | ICD-10-CM | POA: Diagnosis present

## 2023-06-21 DIAGNOSIS — E8779 Other fluid overload: Secondary | ICD-10-CM | POA: Diagnosis not present

## 2023-06-21 DIAGNOSIS — Z79899 Other long term (current) drug therapy: Secondary | ICD-10-CM | POA: Diagnosis not present

## 2023-06-21 DIAGNOSIS — O864 Pyrexia of unknown origin following delivery: Secondary | ICD-10-CM | POA: Diagnosis not present

## 2023-06-21 DIAGNOSIS — O1414 Severe pre-eclampsia complicating childbirth: Secondary | ICD-10-CM | POA: Diagnosis present

## 2023-06-21 DIAGNOSIS — D72829 Elevated white blood cell count, unspecified: Secondary | ICD-10-CM | POA: Diagnosis present

## 2023-06-21 DIAGNOSIS — D62 Acute posthemorrhagic anemia: Secondary | ICD-10-CM | POA: Diagnosis not present

## 2023-06-21 DIAGNOSIS — O9962 Diseases of the digestive system complicating childbirth: Secondary | ICD-10-CM | POA: Diagnosis present

## 2023-06-21 DIAGNOSIS — O9081 Anemia of the puerperium: Secondary | ICD-10-CM | POA: Diagnosis not present

## 2023-06-21 DIAGNOSIS — Z3A35 35 weeks gestation of pregnancy: Secondary | ICD-10-CM

## 2023-06-21 DIAGNOSIS — Z79890 Hormone replacement therapy (postmenopausal): Secondary | ICD-10-CM | POA: Diagnosis not present

## 2023-06-21 MED ORDER — POTASSIUM CHLORIDE CRYS ER 20 MEQ PO TBCR
20.0000 meq | EXTENDED_RELEASE_TABLET | Freq: Every day | ORAL | Status: AC
  Administered 2023-06-21 – 2023-06-23 (×3): 20 meq via ORAL
  Filled 2023-06-21 (×3): qty 1

## 2023-06-21 MED ORDER — LABETALOL HCL 200 MG PO TABS
300.0000 mg | ORAL_TABLET | Freq: Three times a day (TID) | ORAL | Status: DC
  Administered 2023-06-21 – 2023-06-24 (×8): 300 mg via ORAL
  Filled 2023-06-21 (×8): qty 1

## 2023-06-21 MED ORDER — FUROSEMIDE 20 MG PO TABS
20.0000 mg | ORAL_TABLET | Freq: Every day | ORAL | Status: DC
  Administered 2023-06-21 – 2023-06-22 (×2): 20 mg via ORAL
  Filled 2023-06-21 (×2): qty 1

## 2023-06-21 MED ORDER — LABETALOL HCL 100 MG PO TABS
100.0000 mg | ORAL_TABLET | Freq: Once | ORAL | Status: AC
  Administered 2023-06-21: 100 mg via ORAL
  Filled 2023-06-21: qty 1

## 2023-06-21 NOTE — Lactation Note (Signed)
This note was copied from a baby's chart. Lactation Consultation Note  Patient Name: Alexandra Escobar [id number] Date: 06/21/2023 Age:40 hours Reason for consult: Follow-up assessment;Primapara;1st time breastfeeding;Other (Comment);Late-preterm 34-36.6wks (GHTN, AMA, IVF)Hypothyroid on Synthroid   LC in to visit with P1 Mom of baby "Alexandra Escobar" who spent the night in the NICU for duskiness and desaturations after feedings.  Baby returned to rooming-in with Mom this am, after observation overnight.  Baby is at an 8% weight loss.  Baby's output is good, yellow seedy stools.  Bilirubin below light level.    It has been recommended baby consume 30 ml 24 cal EBM/formula by slow flow nipple.  Reviewed paced bottle feeding with Mom and GMOB.    Last feeding, baby took 15 ml.  Mom holding baby clothed and swaddled on her chest.  LC recommended baby be STS and Mom quickly agreed.  Mom had just finished pumping for 10 ml.  LC assisted Mom with STS and placed blanket over baby who was quietly sleeping.  No fussing.  LC talked to Mom regarding offering the breast AFTER baby is fed if baby is cueing that she is hungry.  Recommended hand expressing a drop onto nipple.  Talked about the need to conserve baby's energy to avoid excess weight loss.  LC sized Mom with 18 mm flanges and washed all the pump parts for Mom, reviewing importance of completely disassembling all the parts for washing and drying.  Plan- 1- STS with baby as much as possible 2- at least every 3 hrs, offer baby supplement of 30 ml of EBM+/24 cal formula by paced bottle (Mom to ask her RN for guidance prn) 3- Pump both breasts on initiation setting until >20 ml expressed and then switch to maintain mode on Pump.  Lactation Tools Discussed/Used Tools: Pump;Flanges;Bottle Flange Size: 18 (dropped flange size down to 18) Breast pump type: Double-Electric Breast Pump Pump Education: Setup, frequency, and cleaning;Milk Storage Reason for  Pumping: support milk supply/LPTI < 5lbs Pumping frequency: more consistent today, encouraged 8 times per 24 hrs Pumped volume: 10 mL  Interventions Interventions: Breast feeding basics reviewed;Skin to skin;Breast massage;Hand express;DEBP;Expressed milk  Discharge Discharge Education: Engorgement and breast care Pump: Hands Free;Personal;Refer for rental [facility])  Consult Status Consult Status: Follow-up Date: 06/22/23 Follow-up type: In-patient    Judee Clara 06/21/2023, 1:40 PM

## 2023-06-21 NOTE — Progress Notes (Signed)
POD #3  Up to BR without dizziness. Some pressure over incision with movement + BM Ambulating much better  Today's Vitals   06/21/23 0407 06/21/23 0425 06/21/23 0600 06/21/23 0731  BP: (!) 162/86 (!) 146/73  137/73  Pulse: (!) 108 (!) 101  95  Resp: 19   18  Temp: 98.4 F (36.9 C)   97.7 F (36.5 C)  TempSrc: Oral   Oral  SpO2:    94%  Weight:      PainSc:   4     Body mass index is 51.98 kg/m.   Abdomen soft, dressing C&D, small spot dried blood on honeycomb LE-2+ edema, improved  A/P: Severe preeclampsia-S/P magnesium sulfate         Post op- good bowel/bladder function         HTN-labetalol 200mg  q8 hours. BP drifitng upward. Will increase to 300mg  po q8 huors and monitor today         Edema - IV lasix 40mg  every day x 3. Will start lasix 20mg  po every day x 3 with KCL supplement         Anemia - D/W options of IV iron vs transfusion PRBCs. She is up to BR without sxs. S/P Feraheme

## 2023-06-21 NOTE — Clinical Social Work Maternal (Signed)
  CLINICAL SOCIAL WORK MATERNAL/CHILD NOTE  Patient Details  Name: Alexandra Escobar MRN: [id number] Date of Birth: Oct 25, 1980  Date:  06/21/2023  Clinical Social Worker Initiating Note:  [place]my [place], [place] Date/Time: Initiated:  06/21/23/1316     Child's Name:  Alexandra Escobar   Biological Parents:  Mother   Need for Interpreter:  None   Reason for Referral:      Address:  [address] [place] [address]    Phone number:  [phone] (home)     Additional phone number:   Household Members/Support Persons (HM/SP):   Household Member/Support Person 1, Household Member/Support Person 2   HM/SP Name Relationship DOB or Age  HM/SP -1   Mother    HM/SP -2 Father      HM/SP -3        HM/SP -4        HM/SP -5        HM/SP -6        HM/SP -7        HM/SP -8          Natural Supports (not living in the home):  Friends, Extended Family, Teacher, music Supports: Therapist   Employment: Self-employed   Type of Work:     Education:  Some Materials engineer arranged:    Surveyor, quantity Resources:      Other Resources:      Cultural/Religious Considerations Which May Impact Care:    Strengths:  Ability to meet basic needs  , Compliance with medical plan  , Home prepared for child  , Pediatrician chosen   Psychotropic Medications:         Pediatrician:    [place]  Pediatrician List: MOB has not contacted pediatrician, list was given.  [facility]  [place]    [place]    [place] [facility]      Pediatrician Fax Number:    Risk Factors/Current Problems:  Family/Relationship Issues     Cognitive State:      Mood/Affect:  Happy  , Comfortable  , Interested     CSW Assessment: CSW introduced self and explained role. MOB scored a 14. MOB sees a therapist and a psychologist. MOB is separated from FOB and will be going home with her parents.   CSW provided education regarding  Baby Blues vs PMADs and provided MOB with resources for mental health follow up.  CSW encouraged MOB to evaluate her mental health throughout the postpartum period with the use of the New Mom Checklist developed by Postpartum Progress as well as the [place] Postnatal Depression Scale and notify a medical professional if symptoms arise.    CSW Plan/Description:  No Further Intervention Required/No Barriers to Discharge, Sudden Infant Death Syndrome (SIDS) Education, Psychosocial Support and Ongoing Assessment of Needs, Perinatal Mood and Anxiety Disorder (PMADs) Education    [place], [place] 06/21/2023, 1:21 PM

## 2023-06-21 NOTE — Progress Notes (Signed)
CTSP for drainage from incision. Wound vac removed yesterday because it had no output and she C/O it was uncomfortable. Today the honeycomb was saturated on right side. When changing dressing the nurse noted continued bloody drainage from right side.  No N/V, good appetite, BM+  Today's Vitals   06/21/23 1605 06/21/23 1930 06/21/23 1944 06/21/23 2059  BP:   132/80   Pulse:   (!) 101   Resp:   18   Temp:   98.1 F (36.7 C)   TempSrc:   Oral   SpO2:   96%   Weight:      PainSc: 0-No pain 0-No pain  6    Body mass index is 51.98 kg/m.   Honeycomb removed. Ecchymosis inferior and along length of incision. No erythema. Small amount of dark blood from a point in right 1/3 of incision. The area is cleaned with alcohol wipes. Sterile cotton swabs placed through this and right side of incision probed>fascia intact. Small amount of old blood drains. 4x4 and ABD dressing applied. D/W patient.

## 2023-06-21 NOTE — Plan of Care (Signed)
  Problem: Education: Goal: Knowledge of disease or condition will improve Outcome: Progressing Goal: Knowledge of the prescribed therapeutic regimen will improve Outcome: Progressing   Problem: Fluid Volume: Goal: Peripheral tissue perfusion will improve Outcome: Progressing   Problem: Clinical Measurements: Goal: Complications related to disease process, condition or treatment will be avoided or minimized Outcome: Progressing   Problem: Health Behavior/Discharge Planning: Goal: Ability to manage health-related needs will improve Outcome: Progressing   Problem: Clinical Measurements: Goal: Ability to maintain clinical measurements within normal limits will improve Outcome: Progressing Goal: Will remain free from infection Outcome: Progressing Goal: Diagnostic test results will improve Outcome: Progressing   Problem: Elimination: Goal: Will not experience complications related to bowel motility Outcome: Progressing   Problem: Pain Management: Goal: General experience of comfort will improve Outcome: Progressing   Problem: Safety: Goal: Ability to remain free from injury will improve Outcome: Progressing   Problem: Skin Integrity: Goal: Risk for impaired skin integrity will decrease Outcome: Progressing   Problem: Education: Goal: Knowledge of the prescribed therapeutic regimen will improve Outcome: Progressing Goal: Understanding of sexual limitations or changes related to disease process or condition will improve Outcome: Progressing   Problem: Self-Concept: Goal: Communication of feelings regarding changes in body function or appearance will improve Outcome: Progressing   Problem: Skin Integrity: Goal: Demonstration of wound healing without infection will improve Outcome: Progressing   Problem: Education: Goal: Knowledge of condition will improve Outcome: Progressing   Problem: Activity: Goal: Will verbalize the importance of balancing activity with  adequate rest periods Outcome: Progressing Goal: Ability to tolerate increased activity will improve Outcome: Progressing   Problem: Coping: Goal: Ability to identify and utilize available resources and services will improve Outcome: Progressing   Problem: Life Cycle: Goal: Chance of risk for complications during the postpartum period will decrease Outcome: Progressing   Problem: Role Relationship: Goal: Ability to demonstrate positive interaction with newborn will improve Outcome: Progressing   Problem: Skin Integrity: Goal: Demonstration of wound healing without infection will improve Outcome: Progressing

## 2023-06-22 ENCOUNTER — Ambulatory Visit: Payer: BC Managed Care – PPO | Admitting: Cardiology

## 2023-06-22 DIAGNOSIS — E8779 Other fluid overload: Secondary | ICD-10-CM

## 2023-06-22 DIAGNOSIS — I10 Essential (primary) hypertension: Secondary | ICD-10-CM

## 2023-06-22 LAB — COMPREHENSIVE METABOLIC PANEL
ALT: 21 U/L (ref 0–44)
AST: 35 U/L (ref 15–41)
Albumin: 2 g/dL — ABNORMAL LOW (ref 3.5–5.0)
Alkaline Phosphatase: 72 U/L (ref 38–126)
Anion gap: 8 (ref 5–15)
BUN: 10 mg/dL (ref 6–20)
CO2: 22 mmol/L (ref 22–32)
Calcium: 8.2 mg/dL — ABNORMAL LOW (ref 8.9–10.3)
Chloride: 108 mmol/L (ref 98–111)
Creatinine, Ser: 0.63 mg/dL (ref 0.44–1.00)
GFR, Estimated: >60 mL/min (ref >60)
Glucose, Bld: 88 mg/dL (ref 70–99)
Potassium: 3.8 mmol/L (ref 3.5–5.1)
Sodium: 138 mmol/L (ref 135–145)
Total Bilirubin: 0.4 mg/dL (ref <1.2)
Total Protein: 4.9 g/dL — ABNORMAL LOW (ref 6.5–8.1)

## 2023-06-22 LAB — CBC
HCT: 22.5 % — ABNORMAL LOW (ref 36.0–46.0)
Hemoglobin: 7.5 g/dL — ABNORMAL LOW (ref 12.0–15.0)
MCH: 32.8 pg (ref 26.0–34.0)
MCHC: 33.3 g/dL (ref 30.0–36.0)
MCV: 98.3 fL (ref 80.0–100.0)
Platelets: 338 10*3/uL (ref 150–400)
RBC: 2.29 MIL/uL — ABNORMAL LOW (ref 3.87–5.11)
RDW: 14.9 % (ref 11.5–15.5)
WBC: 13.3 10*3/uL — ABNORMAL HIGH (ref 4.0–10.5)
nRBC: 1.6 % — ABNORMAL HIGH (ref 0.0–0.2)

## 2023-06-22 LAB — SURGICAL PATHOLOGY

## 2023-06-22 MED ORDER — TORSEMIDE 20 MG PO TABS
20.0000 mg | ORAL_TABLET | Freq: Every day | ORAL | Status: DC
  Administered 2023-06-23 – 2023-06-27 (×5): 20 mg via ORAL
  Filled 2023-06-22 (×7): qty 1

## 2023-06-22 NOTE — Progress Notes (Addendum)
Appreciate cardiology input.  Overall, patient is feeling better now that when I saw her in the morning. Incision does not feel as tight. Just took a shower.  BP 125/76 (BP Location: Left Arm)   Pulse 87   Temp 97.9 F (36.6 C) (Oral)   Resp 18   Wt 128.9 kg   LMP 10/11/2022 (Approximate)   SpO2 99%   Breastfeeding Unknown   BMI 51.98 kg/m  Results for orders placed or performed during the hospital encounter of 06/17/23 (from the past 24 hour(s))  CBC     Status: Abnormal   Collection Time: 06/22/23  5:15 AM  Result Value Ref Range   WBC 13.3 (H) 4.0 - 10.5 K/uL   RBC 2.29 (L) 3.87 - 5.11 MIL/uL   Hemoglobin 7.5 (L) 12.0 - 15.0 g/dL   HCT 57.8 (L) 46.9 - 62.9 %   MCV 98.3 80.0 - 100.0 fL   MCH 32.8 26.0 - 34.0 pg   MCHC 33.3 30.0 - 36.0 g/dL   RDW 52.8 41.3 - 24.4 %   Platelets 338 150 - 400 K/uL   nRBC 1.6 (H) 0.0 - 0.2 %  Comprehensive metabolic panel     Status: Abnormal   Collection Time: 06/22/23  5:15 AM  Result Value Ref Range   Sodium 138 135 - 145 mmol/L   Potassium 3.8 3.5 - 5.1 mmol/L   Chloride 108 98 - 111 mmol/L   CO2 22 22 - 32 mmol/L   Glucose, Bld 88 70 - 99 mg/dL   BUN 10 6 - 20 mg/dL   Creatinine, Ser 0.10 0.44 - 1.00 mg/dL   Calcium 8.2 (L) 8.9 - 10.3 mg/dL   Total Protein 4.9 (L) 6.5 - 8.1 g/dL   Albumin 2.0 (L) 3.5 - 5.0 g/dL   AST 35 15 - 41 U/L   ALT 21 0 - 44 U/L   Alkaline Phosphatase 72 38 - 126 U/L   Total Bilirubin 0.4 <1.2 mg/dL   GFR, Estimated >27 >25 mL/min   Anion gap 8 5 - 15   Scheduled Meds:  acetaminophen  1,000 mg Oral Q6H   labetalol  300 mg Oral Q8H   levothyroxine  50 mcg Oral QAC breakfast   mirtazapine  15 mg Oral QHS   potassium chloride  20 mEq Oral Daily   prenatal multivitamin  1 tablet Oral Q1200   senna-docusate  2 tablet Oral Daily   sertraline  150 mg Oral Daily   simethicone  80 mg Oral TID PC   [START ON 06/23/2023] torsemide  20 mg Oral Daily   Continuous Infusions: PRN Meds:.coconut oil, witch  hazel-glycerin **AND** dibucaine, diphenhydrAMINE, HYDROmorphone (DILAUDID) injection, ketorolac, menthol-cetylpyridinium, oxyCODONE, simethicone, zolpidem Bandage removed Honeycomb saturated with bright red blood When I removed the honeycomb there was an initial gush of bright red blood from the incision  Steri strips removed Packing removed - saturated  Wound cleaned with Q tips and peroxide - no active bleeding and no residual hematoma expelled  Incision now looks drier  Packed with one large piece of iodoform guaze  Steri strips reapplied Abd x 2 placed  Pressure dressing reapplied  Recommend remove packing and change packing tomorrow morning  Check cbc in am And order home health consult when she goes home for bid dressing changes  Ecchymosis does look IMPROVED from this morning. All questions answered at the bedside Nursing present for dressing change and plan reviewed

## 2023-06-22 NOTE — Progress Notes (Signed)
Patient is noticing a lot of incisional discomfort. BP 138/70 (BP Location: Left Arm)   Pulse 88   Temp 97.8 F (36.6 C) (Oral)   Resp 18   Wt 128.9 kg   LMP 10/11/2022 (Approximate)   SpO2 96%   Breastfeeding Unknown   BMI 51.98 kg/m  Results for orders placed or performed during the hospital encounter of 06/17/23 (from the past 24 hour(s))  CBC     Status: Abnormal   Collection Time: 06/22/23  5:15 AM  Result Value Ref Range   WBC 13.3 (H) 4.0 - 10.5 K/uL   RBC 2.29 (L) 3.87 - 5.11 MIL/uL   Hemoglobin 7.5 (L) 12.0 - 15.0 g/dL   HCT 14.7 (L) 82.9 - 56.2 %   MCV 98.3 80.0 - 100.0 fL   MCH 32.8 26.0 - 34.0 pg   MCHC 33.3 30.0 - 36.0 g/dL   RDW 13.0 86.5 - 78.4 %   Platelets 338 150 - 400 K/uL   nRBC 1.6 (H) 0.0 - 0.2 %  Comprehensive metabolic panel     Status: Abnormal   Collection Time: 06/22/23  5:15 AM  Result Value Ref Range   Sodium 138 135 - 145 mmol/L   Potassium 3.8 3.5 - 5.1 mmol/L   Chloride 108 98 - 111 mmol/L   CO2 22 22 - 32 mmol/L   Glucose, Bld 88 70 - 99 mg/dL   BUN 10 6 - 20 mg/dL   Creatinine, Ser 6.96 0.44 - 1.00 mg/dL   Calcium 8.2 (L) 8.9 - 10.3 mg/dL   Total Protein 4.9 (L) 6.5 - 8.1 g/dL   Albumin 2.0 (L) 3.5 - 5.0 g/dL   AST 35 15 - 41 U/L   ALT 21 0 - 44 U/L   Alkaline Phosphatase 72 38 - 126 U/L   Total Bilirubin 0.4 <1.2 mg/dL   GFR, Estimated >29 >52 mL/min   Anion gap 8 5 - 15   Abdomen bandage is removed  There is a separation right corner of incision Extensive ecchymosis With probe with q tip a large formed hematoma is expelled right side  Entire incision is probed with more hematoma expelled The wound is packed with iodoform gauze  Tunnel goes all the way along incision  Skin cleaned  Steri strips applied to other two thirds of incision  Honeycomb applied  Pressure dressing applied  IMPRESSION: POD # 4  C section Severe preeclampsia Wound hematoma  PLAN: Stay inpatient Will reevaluate wound this afternoon  And plan to  replace packing of wound  All questions answered at the bedside

## 2023-06-22 NOTE — Progress Notes (Signed)
Rounding Note    Patient Name: Alexandra Escobar Date of Encounter: 06/22/2023  [facility] Cardiologist: Thomasene Ripple, DO   Subjective   She feels that BP has been better controlled, but she did not make much urine on lasix and feels that she is getting bloated again. Has been drinking a lot of water, reviewed water restriction  Inpatient Medications    Scheduled Meds:  acetaminophen  1,000 mg Oral Q6H   furosemide  20 mg Oral Daily   labetalol  300 mg Oral Q8H   levothyroxine  50 mcg Oral QAC breakfast   mirtazapine  15 mg Oral QHS   potassium chloride  20 mEq Oral Daily   prenatal multivitamin  1 tablet Oral Q1200   senna-docusate  2 tablet Oral Daily   sertraline  150 mg Oral Daily   simethicone  80 mg Oral TID PC   Continuous Infusions:  PRN Meds: coconut oil, witch hazel-glycerin **AND** dibucaine, diphenhydrAMINE, HYDROmorphone (DILAUDID) injection, ketorolac, menthol-cetylpyridinium, oxyCODONE, simethicone, zolpidem   Vital Signs    Vitals:   06/22/23 0742 06/22/23 1144 06/22/23 1750 06/22/23 1926  BP: 138/70 110/65 138/85 125/76  Pulse: 88 89 98 87  Resp: 18 18 17 18   Temp: 97.8 F (36.6 C) 97.9 F (36.6 C) 98 F (36.7 C) 97.9 F (36.6 C)  TempSrc: Oral Oral Oral Oral  SpO2: 96% 96% 98% 99%  Weight:        Intake/Output Summary (Last 24 hours) at 06/22/2023 2055 Last data filed at 06/22/2023 1300 Gross per 24 hour  Intake 240 ml  Output 350 ml  Net -110 ml      06/17/2023   11:30 AM 06/16/2023    2:05 PM 06/13/2023    1:15 PM  Last 3 Weights  Weight (lbs) 284 lb 3.2 oz 284 lb 270 lb  Weight (kg) 128.912 kg 128.822 kg 122.471 kg      Telemetry    Not on telemetry  Physical Exam   GEN: No acute distress.   Neck: No JVD Cardiac: RRR, no murmurs, rubs, or gallops.  Respiratory: Clear to auscultation bilaterally. GI: Soft, nontender, non-distended, +BS, healing c section scar MS: bilateral 1+ LE edema; No deformity. Neuro:   Nonfocal  Psych: Normal affect   New pertinent results (labs, ECG, imaging, cardiac studies)     Patient Profile     42 y.o. female with PMH hypertension, severe preeclampsia presented with DOE and weight gain, underwent c-section, now with persistent edema consistent with acute diastolic heart failure  Assessment & Plan    Severe pre-eclampsia s/p c section 06/18/23 at 35 weeks Chronic hypertension -gained total of 114 lbs with pregnancy per notes. Weight on admission (before delivery) 284 lbs -no post delivery weight. I/O inaccurate and not well recorded over the last several days, but charted as ~6L urine out on 11/29 alone -limited echo 11/28 with normal LVEF 60-65%, RAP 3 mmHg (IVC normal and collapses). RV normal.  -echo 06/05/23 with normal diastology -overall likely fluid retention from pregnancy now releasing. Not consistent with diastolic heart failure. Lungs are clear. -blood pressure much improved, within normal range currently. Continue labetalol -does have pitting edema, feels better with diuretic. Reports poor urine output on oral lasix. Will trial oral torsemide tomorrow AM. If does not make good urine on this, will give IV lasix -Cr stable -Hgb drifting down, from 12.2 preop to 7.5 today. Management per primary team     Signed, Jodelle Red, MD  06/22/2023,  8:55 PM

## 2023-06-23 LAB — CBC
HCT: 22.1 % — ABNORMAL LOW (ref 36.0–46.0)
Hemoglobin: 7.4 g/dL — ABNORMAL LOW (ref 12.0–15.0)
MCH: 33.5 pg (ref 26.0–34.0)
MCHC: 33.5 g/dL (ref 30.0–36.0)
MCV: 100 fL (ref 80.0–100.0)
Platelets: 372 10*3/uL (ref 150–400)
RBC: 2.21 MIL/uL — ABNORMAL LOW (ref 3.87–5.11)
RDW: 15.1 % (ref 11.5–15.5)
WBC: 12.7 10*3/uL — ABNORMAL HIGH (ref 4.0–10.5)
nRBC: 1.5 % — ABNORMAL HIGH (ref 0.0–0.2)

## 2023-06-23 MED ORDER — SODIUM CHLORIDE 0.9 % IV SOLN
1.0000 g | Freq: Two times a day (BID) | INTRAVENOUS | Status: DC
  Administered 2023-06-23 – 2023-06-24 (×3): 1 g via INTRAVENOUS
  Filled 2023-06-23 (×4): qty 1

## 2023-06-23 MED ORDER — IBUPROFEN 600 MG PO TABS
600.0000 mg | ORAL_TABLET | Freq: Four times a day (QID) | ORAL | Status: DC | PRN
  Administered 2023-06-23 – 2023-06-25 (×2): 600 mg via ORAL
  Filled 2023-06-23 (×3): qty 1

## 2023-06-23 NOTE — Progress Notes (Signed)
Subjective: Postpartum Day 5: Cesarean Delivery Patient reports incisional pain, tolerating PO, and no problems voiding.   Baby doing well in NICU Incision feels like less pressure but still draining  Objective: Vital signs in last 24 hours: Temp:  [97.7 F (36.5 C)-98.4 F (36.9 C)] 97.7 F (36.5 C) (12/03 0815) Pulse Rate:  [87-100] 93 (12/03 0815) Resp:  [16-19] 19 (12/03 0815) BP: (110-140)/(52-85) 140/71 (12/03 0815) SpO2:  [94 %-99 %] 98 % (12/03 0815) Weight:  [118.9 kg] 118.9 kg (12/03 0900)  Physical Exam:  General: alert, cooperative, appears stated age, and mild distress Lochia: appropriate Uterine Fundus: firm Incision:  Bandage mildly soiled with blood Packing removed and minimal clots and blood No purulence Left edge opened with q tip to relieve small clots due to patient complaining of pressure there and palpated tender Repacked with iodoform gauze and steri strips re applied   DVT Evaluation: No evidence of DVT seen on physical exam. Calf/Ankle edema is present.  Recent Labs    06/22/23 0515 06/23/23 0621  HGB 7.5* 7.4*  HCT 22.5* 22.1*    Assessment/Plan: Status post Cesarean section. Postoperative course complicated by severe [facility]   Cardiology recommending more lasix since still signficant edema Wound hematoma - cleaned and more clots on left evacuated and repacked Discussed options with Charlisa - has hx of  this with previous rectal hematoma. Wants to continue with packing without opening for now as long as improving.  Consider open entire skin and pack/clean to ensure all clots evacuated. Rec daily weights Will give empiric abx IV for 24 hours then change to po CBC stable/anemia and s/p IV Fe  Turner Daniels, MD 06/23/2023, 9:23 AM

## 2023-06-23 NOTE — Lactation Note (Addendum)
This note was copied from a baby's chart.  NICU Lactation Consultation Note  Patient Name: Alexandra Escobar [id number] Date: 06/23/2023 Age:40 days  Reason for consult: Follow-up assessment; NICU baby; Primapara; 1st time breastfeeding; Late-preterm 34-36.6wks; Infant < 6lbs; Maternal endocrine disorder Type of Endocrine Disorder?: Thyroid AMA, PCOS, IVF pregnancy  SUBJECTIVE  LC in to visit with P1 Mom of LPTI transferred back to NICU for desats with bottle feeding.   Mom currently very sleepy and about to receive IV antibiotics for incisional drainage.  Mom hadn't pumped since yesterday.  LC offered to assist with pumping using the pumping band to be hands free.  Breasts are filling today, last expressed 10 ml at 0300.  LC noted pumping parts floating in cold water.  Reviewed importance of not soaking parts for >5 mins.  Mom states she fell asleep.  LC washed the pump parts and assisted with a pumping on initiation setting.  LC took 3 colostrum containers to baby's room, labeled them and provided Mom with the breast milk labels to keep track of date and time of expression.  Mom encouraged to do STS with baby, and asking for LC prn for assist with positioning and latch baby to the breast.    OBJECTIVE Infant data: No data recorded O2 Device: Room Air  Infant feeding assessment Scale for Readiness: 1 Scale for Quality: 3 (loss of liquid)   Maternal data: [id number] C-Section, Low Transverse Pumping frequency: 3 times yesterday, encouraged to pump more frequently today, every 3. Pumped volume: 10 mL Flange Size: 18 Hands-free pumping top sizes: Small/Medium (Blue)  Pump: Hands Free, Personal, Refer for rental ([employer])  Feeding Status: Ad lib Feeding method: Bottle Nipple Type: Dr. Levert Feinstein Preemie  Maternal: Milk volume: Normal  INTERVENTIONS/PLAN Interventions: Interventions: Breast feeding basics reviewed; Skin to skin; Breast massage; Hand express; Expressed milk;  DEBP; Education Discharge Education: Engorgement and breast care Tools: Pump; Flanges; Hands-free pumping top; Bottle Pump Education: Setup, frequency, and cleaning; Milk Storage  Plan: Consult Status: NICU follow-up NICU Follow-up type: Verify onset of copious milk; Verify absence of engorgement; Maternal D/C visit   Judee Clara 06/23/2023, 12:25 PM

## 2023-06-23 NOTE — Progress Notes (Signed)
RNCM notified CSW that MOB requested to speak with CSW.   CSW followed up with MOB at bedside and introduced self. CSW inquired about MOB's needs. MOB reported that she needed to make corrections to infant's birth certificate and noted that no one followed up with her. CSW agreed to notify birth registry of MOB's request. CSW inquired about any additional needs, MOB reported none.   CSW acknowledged infant's NICU admission and inquired about MOB's interest in CSW following up for support, MOB reported that she was interested. CSW agreed to check in with MOB weekly and provided MOB with CSW contact information. MOB thanked CSW. CSW acknowledged that MOB was previously assessed and assessed for safety as CSW did not see it previously documented. MOB denied SI, HI, and domestic violence.   CSW updated birth registry of MOB's request. Birth registry agreed to follow up with MOB.  Celso Sickle, LCSW Clinical Social Worker [facility] Cell#: [phone]

## 2023-06-23 NOTE — Progress Notes (Signed)
Rounding Note    Patient Name: Alexandra Escobar Date of Encounter: 06/23/2023  [facility] Cardiologist: Thomasene Ripple, DO   Subjective   Feels that torsemide is working much better than lasix did for volume removal. BP continues to trend down. No lightheadedness/dizziness.  Inpatient Medications    Scheduled Meds:  acetaminophen  1,000 mg Oral Q6H   labetalol  300 mg Oral Q8H   levothyroxine  50 mcg Oral QAC breakfast   mirtazapine  15 mg Oral QHS   potassium chloride  20 mEq Oral Daily   prenatal multivitamin  1 tablet Oral Q1200   senna-docusate  2 tablet Oral Daily   sertraline  150 mg Oral Daily   simethicone  80 mg Oral TID PC   torsemide  20 mg Oral Daily   Continuous Infusions:  PRN Meds: coconut oil, witch hazel-glycerin **AND** dibucaine, diphenhydrAMINE, HYDROmorphone (DILAUDID) injection, ketorolac, menthol-cetylpyridinium, oxyCODONE, simethicone, zolpidem   Vital Signs    Vitals:   06/22/23 1926 06/22/23 2319 06/23/23 0443 06/23/23 0815  BP: 125/76 (!) 140/71 (!) 113/52 (!) 140/71  Pulse: 87 100 89 93  Resp: 18 18 16 19   Temp: 97.9 F (36.6 C) 98 F (36.7 C) 98.4 F (36.9 C) 97.7 F (36.5 C)  TempSrc: Oral Oral Oral Oral  SpO2: 99% 94% 95% 98%  Weight:        Intake/Output Summary (Last 24 hours) at 06/23/2023 0826 Last data filed at 06/22/2023 1300 Gross per 24 hour  Intake 240 ml  Output 350 ml  Net -110 ml      06/17/2023   11:30 AM 06/16/2023    2:05 PM 06/13/2023    1:15 PM  Last 3 Weights  Weight (lbs) 284 lb 3.2 oz 284 lb 270 lb  Weight (kg) 128.912 kg 128.822 kg 122.471 kg      Telemetry    Not on telemetry  Physical Exam   GEN: No acute distress.   Neck: No JVD Cardiac: RRR, no murmurs, rubs, or gallops.  Respiratory: Clear to auscultation bilaterally. GI: Soft, nontender, non-distended, +BS, healing c section scar MS: bilateral 1+ LE edema; No deformity. Neuro:  Nonfocal  Psych: Normal affect   New pertinent  results (labs, ECG, imaging, cardiac studies)     Patient Profile     42 y.o. female with PMH hypertension, severe preeclampsia presented with DOE and weight gain, underwent c-section, now with persistent edema consistent with acute diastolic heart failure  Assessment & Plan    Severe pre-eclampsia s/p c section 06/18/23 at 35 weeks Chronic hypertension -gained total of 114 lbs with pregnancy per notes. Weight on admission (before delivery) 128.9 kg. -weight today 118.9 kg -I/O inaccurate and not well recorded over the last several days, but charted as ~6L urine out on 11/29 alone -limited echo 11/28 with normal LVEF 60-65%, RAP 3 mmHg (IVC normal and collapses). RV normal.  -echo 06/05/23 with normal diastology -overall likely fluid retention from pregnancy now releasing. Not consistent with diastolic heart failure. Lungs are clear. -blood pressure much improved, within normal range currently. Continue labetalol -much improved urine output on torsemide compared to lasix -Cr stable -Hgb drifting down, from 12.2 preop to 7.4 today. Management per primary team  From cardiac standpoint, anticipate that she will need diuretic at discharge, as she appears to still have at least 10 lbs of fluid on, if not more. Would continue torsemide 20 mg at discharge, has close follow up with Dr. Servando Salina. Reviewed daily weights at  home.  Will check on her tomorrow, but from cardiac standpoint she can potentially be discharged tomorrow. Primary team is working on wound/dressing changes for home.     Signed, Jodelle Red, MD  06/23/2023,

## 2023-06-23 NOTE — TOC Initial Note (Signed)
Transition of Care [facility]) - Initial/Assessment Note    Patient Details  Name: Alexandra Escobar MRN: [id number] Date of Birth: [birth year]  [facility]) CM/SW Contact:    Geoffery Lyons, RN Phone Number:# [phone] 06/23/2023, 12:07 PM  Clinical Narrative:                  CM met with mom in room and patient's mother in room. Patient reviewed demographics with me and they are correct in system and she said she will be staying with her mother at discharge and that is the address listed on the face sheet.  Mom's phone # is correct and is : [phone].  Mom has PCP and Cardiologist and is connected. Patient has consult for wound care dressing changes for home. CM discussed home health agency with patient and she did not have preference as long as it is contracted with their insurance.  CM explained that a family member will need to learn how to do the dressing changes and show demonstration here at the hospital before being discharged.  Mom and her mother verbalized understanding. CM shared this information with MD and with RN. Dressing this am has already been done by MD- Dr. Rana Snare, if possible please show patient's mother dressing change this pm and let her try to do am dressing change tomorrow. CM called [facility] with referral   Patient Goals and CMS Choice            Expected Discharge Plan and Services To dc home   Prior Living Arrangements/Services Home with parents  Activities of Daily Living Home Assistive Devices/Equipment: None ADL Screening (condition at time of admission) Independently performs ADLs?: Yes (appropriate for developmental age) Is the patient deaf or have difficulty hearing?: No Does the patient have difficulty seeing, even when wearing glasses/contacts?: No Does the patient have difficulty concentrating, remembering, or making decisions?: No      Emotional Assessment Patient requested to see CSW and CSW  notified.  Admission diagnosis:  Preeclampsia [O14.90] Patient Active Problem List   Diagnosis Date Noted   Hypertension affecting pregnancy, antepartum 06/21/2023   Preeclampsia 06/17/2023   Chest pain 06/04/2023   Hypertension    Severe episode of recurrent major depressive disorder, without psychotic features (HCC) 12/18/2021   Hyperthyroidism 11/05/2021   Acquired hypothyroidism 10/22/2021   Encounter for general adult medical examination with abnormal findings 10/21/2021   Vitamin D deficiency 08/06/2021   Uterine fibroids affecting pregnancy 07/06/2021   Depressive disorder 06/11/2021   Polycystic ovary syndrome 06/11/2021   Pregnancy 06/11/2021   Insomnia 03/08/2015   PCP:  Etta Grandchild, MD Pharmacy:   [id number] - [place], [place] - [address] [place] [address] Phone: [phone] Fax: [phone]     Social Determinants of Health (SDOH) Social History: SDOH Screenings   Food Insecurity: No Food Insecurity (06/17/2023)  Housing: Low Risk  (06/17/2023)  Transportation Needs: No Transportation Needs (06/17/2023)  Utilities: Not At Risk (06/17/2023)  Depression (PHQ2-9): Low Risk  (05/01/2023)  Financial Resource Strain: Low Risk  (04/30/2023)  Physical Activity: Unknown (04/30/2023)  Social Connections: Moderately Integrated (04/30/2023)  Stress: No Stress Concern Present (04/30/2023)  Tobacco Use: Low Risk  (06/17/2023)   SDOH Interventions:     Readmission Risk Interventions     No data to display

## 2023-06-23 NOTE — Lactation Note (Signed)
This note was copied from a baby's chart.  NICU Lactation Consultation Note  Patient Name: Alexandra Escobar [id number] Date: 06/23/2023 Age:40 days  Reason for consult: Follow-up assessment; Breastfeeding assistance; NICU baby; Primapara; 1st time breastfeeding; Maternal endocrine disorder; Late-preterm 34-36.6wks; Infant < 6lbs (CHTN) Type of Endocrine Disorder?: Thyroid; PCOS  SUBJECTIVE LC met Mom in NICU for assistance with breastfeeding.  Baby is ad lib.  Baby down 8% from birth weight.  Mom's last pumping totalled 10 ml.    Baby placed STS on Mom's chest where she fell asleep.  LC left baby there for a few minutes and returned with a nipple shield that Mom had forgotten in her room.  LC reviewed hand expression, drop placed on baby's mouth.  No cueing noted.  Moved baby down prone in laid back position over breast and baby became fussy.  Baby placed back on Mom's chest where she relaxed her body and closed her eye.  LC reviewed how to place the NS, 16 mm being a good fit today.  Encouraged continued consistent pumping to support lactogenesis 2. Mom wanting baby to be offered a bottle, RN notified and taught Mom paced bottle feeding.  OBJECTIVE Infant data: No data recorded O2 Device: Room Air  Infant feeding assessment Scale for Readiness: 1 Scale for Quality: 3 (loss of liquid)   Maternal data: [id number] C-Section, Low Transverse Pumping frequency: 8 times per 24 hrs Pumped volume: 10 mL Flange Size: 18 Hands-free pumping top sizes: Small/Medium (Blue)  Pump: Hands Free, Personal, Refer for rental ([employer])  ASSESSMENT Infant: Latch: Too sleepy or reluctant, no latch achieved, no sucking elicited. Audible Swallowing: None Type of Nipple: Everted at rest and after stimulation Comfort (Breast/Nipple): Soft / non-tender Hold (Positioning): Full assist, staff holds infant at breast LATCH Score: 4  Feeding Status: Ad lib Feeding method: Breast Nipple Type: Dr. Levert Feinstein Preemie  Maternal: Milk volume: Normal (low normal)  INTERVENTIONS/PLAN Interventions: Interventions: Assisted with latch; Skin to skin; Breast massage; Hand express; Support pillows; Adjust position; Position options; DEBP; Pace feeding Discharge Education: Engorgement and breast care Tools: Pump; Flanges; Hands-free pumping top Pump Education: Setup, frequency, and cleaning; Milk Storage  Plan: Consult Status: NICU follow-up NICU Follow-up type: Maternal D/C visit; Verify onset of copious milk; Verify absence of engorgement   Judee Clara 06/23/2023, 3:09 PM

## 2023-06-23 NOTE — Progress Notes (Signed)
Patient ID: Alexandra Escobar, female   DOB: 03-26-1981, 42 y.o.   MRN: [id number] Wound change with less drainage (blood) on pad Gauze removed but still clots (old) in wound No purulence or erythema Cleaned with H202 Changed to Kerlex with saline and seemed to fill the wound better Abd pad placed  Wound hematoma - better than this morning. We have home health care arranged for 1-2 x daily and her mom can do a change as well but will need to be shown how too do it.  The orders will need to be added for them (not done know since we are not sure if this is working) Discussed current packing which may not be effective if the entire clot is not evacuated.  Hopefully change to [facility] will improve this. Spoke with patient, the OB OR staff and Anesthesia about opening and cleaning and evacuating clot.  Pt just ate and the OR is currently busy.  They rec if need to be done, best would be daytime and NPO.  Pt agrees.  Will reassess and if not significantly improving in am, she wants wound opened to evacuate clots.

## 2023-06-24 ENCOUNTER — Encounter (HOSPITAL_COMMUNITY)
Admission: AD | Disposition: A | Discharge status: Home / Self Care | Payer: Self-pay | Source: Home / Self Care | Attending: Obstetrics and Gynecology

## 2023-06-24 ENCOUNTER — Inpatient Hospital Stay (HOSPITAL_COMMUNITY): Payer: BC Managed Care – PPO | Admitting: Anesthesiology

## 2023-06-24 ENCOUNTER — Encounter (HOSPITAL_COMMUNITY): Payer: Self-pay | Admitting: Obstetrics and Gynecology

## 2023-06-24 ENCOUNTER — Encounter (HOSPITAL_COMMUNITY): Payer: Self-pay | Admitting: Anesthesiology

## 2023-06-24 ENCOUNTER — Other Ambulatory Visit: Payer: Self-pay

## 2023-06-24 HISTORY — PX: HEMATOMA EVACUATION: SHX5118

## 2023-06-24 LAB — CBC
HCT: 22.8 % — ABNORMAL LOW (ref 36.0–46.0)
Hemoglobin: 7.3 g/dL — ABNORMAL LOW (ref 12.0–15.0)
MCH: 31.9 pg (ref 26.0–34.0)
MCHC: 32 g/dL (ref 30.0–36.0)
MCV: 99.6 fL (ref 80.0–100.0)
Platelets: 395 10*3/uL (ref 150–400)
RBC: 2.29 MIL/uL — ABNORMAL LOW (ref 3.87–5.11)
RDW: 15.9 % — ABNORMAL HIGH (ref 11.5–15.5)
WBC: 12.9 10*3/uL — ABNORMAL HIGH (ref 4.0–10.5)
nRBC: 1.1 % — ABNORMAL HIGH (ref 0.0–0.2)

## 2023-06-24 LAB — TYPE AND SCREEN
ABO/RH(D): O POS
Antibody Screen: NEGATIVE

## 2023-06-24 LAB — BASIC METABOLIC PANEL
Anion gap: 10 (ref 5–15)
BUN: 17 mg/dL (ref 6–20)
CO2: 23 mmol/L (ref 22–32)
Calcium: 8.2 mg/dL — ABNORMAL LOW (ref 8.9–10.3)
Chloride: 105 mmol/L (ref 98–111)
Creatinine, Ser: 1.1 mg/dL — ABNORMAL HIGH (ref 0.44–1.00)
GFR, Estimated: >60 mL/min (ref >60)
Glucose, Bld: 95 mg/dL (ref 70–99)
Potassium: 3.7 mmol/L (ref 3.5–5.1)
Sodium: 138 mmol/L (ref 135–145)

## 2023-06-24 IMAGING — CT CT ANGIO CHEST
2 of 8 series · 19 of 36 positions shown · IV contrast (Omnipaque)
Comparison: 09/02/2021 chest x-ray

CLINICAL DATA: Chest pain, positive D-dimer

EXAM:
CT ANGIOGRAPHY CHEST WITH CONTRAST
TECHNIQUE: Multidetector CT imaging of the chest was performed using the
standard protocol during bolus administration of intravenous
contrast. Multiplanar CT image reconstructions and MIPs were
obtained to evaluate the vascular anatomy.

[Series 6: pe thins · axial · 0.63mm/px · z∈[-264,-32]mm · 18 of 260 slices shown]
[im 14/260  lung]
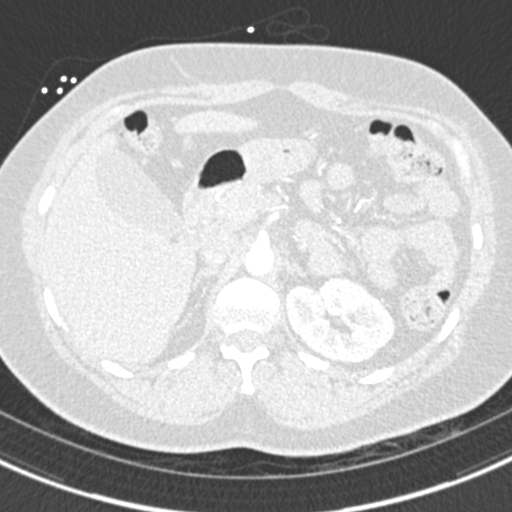
[im 28/260  mediastinal]
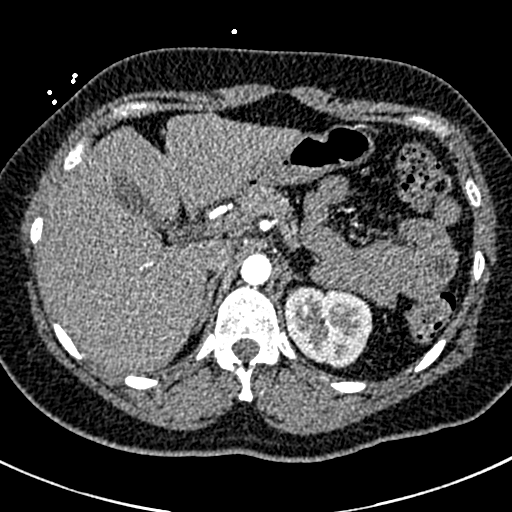
[im 41/260  lung]
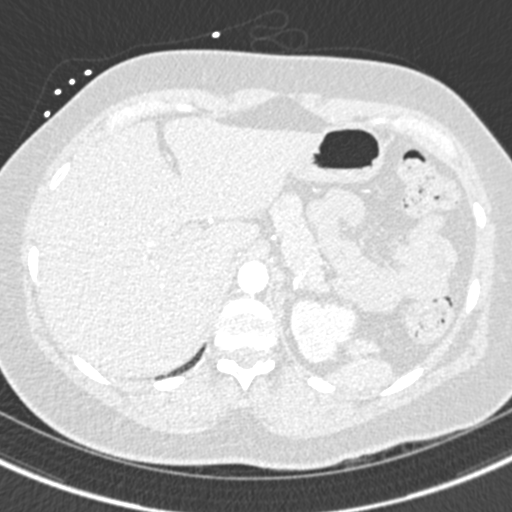
[im 55/260  mediastinal]
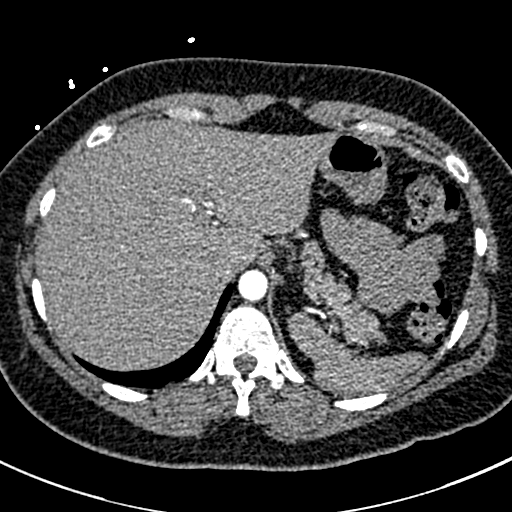
[im 69/260  lung]
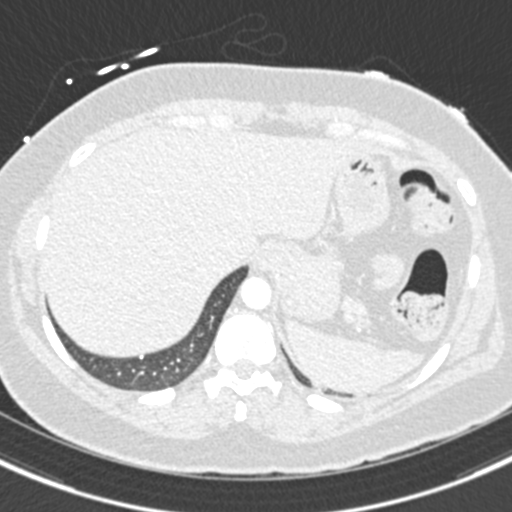
[im 82/260  mediastinal]
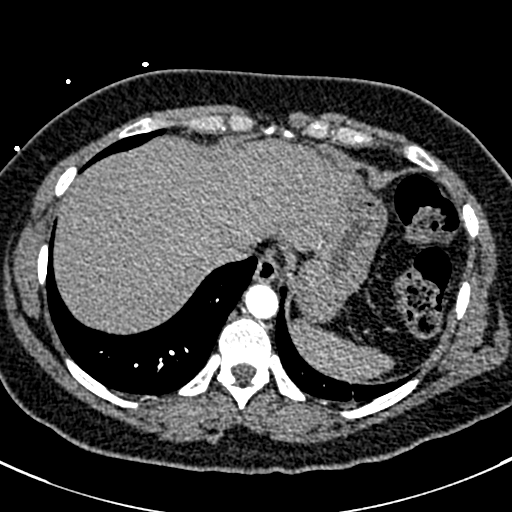
[im 96/260  lung]
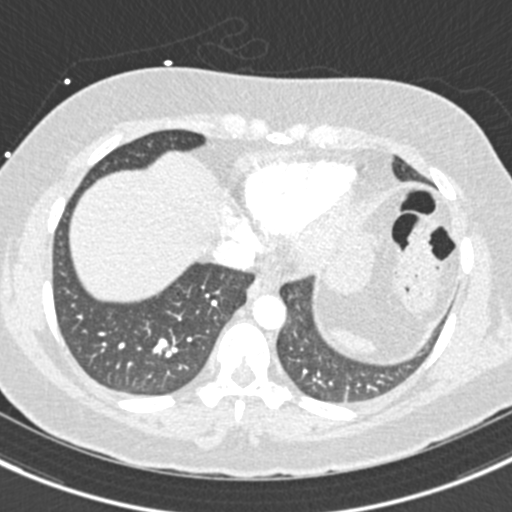
[im 110/260  mediastinal]
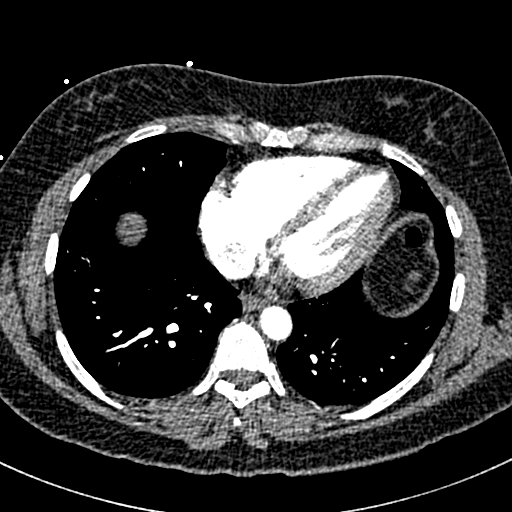
[im 123/260  lung]
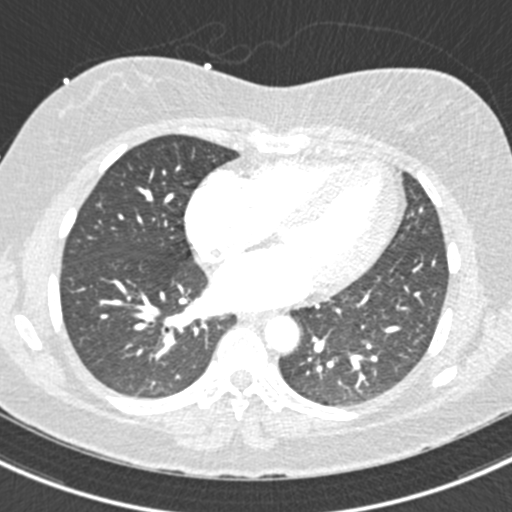
[im 137/260  mediastinal]
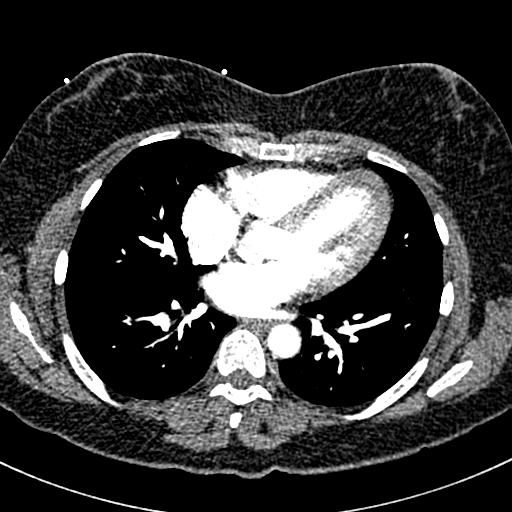
[im 150/260  lung]
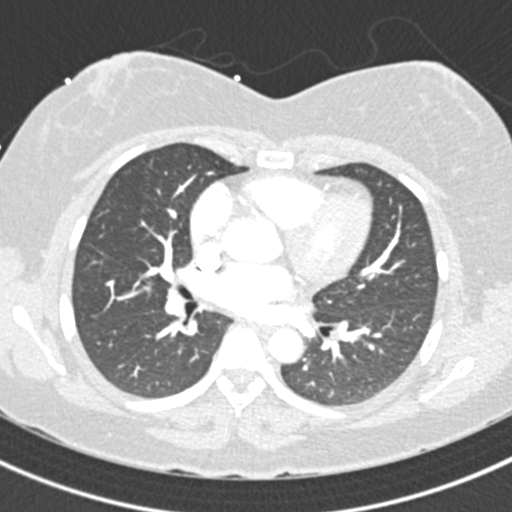
[im 164/260  mediastinal]
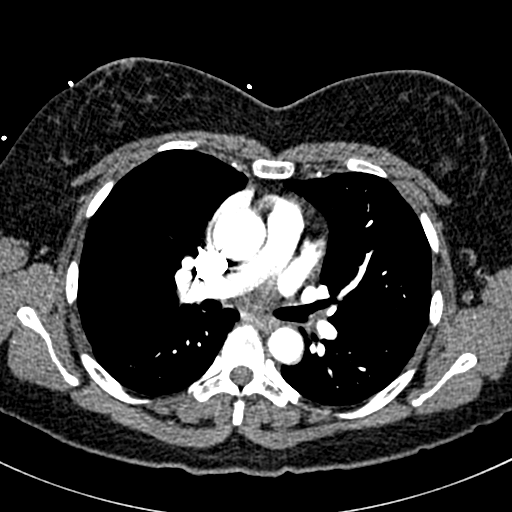
[im 178/260  lung]
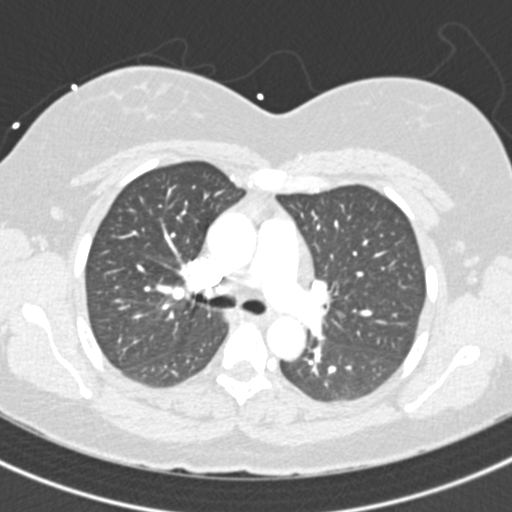
[im 191/260  mediastinal]
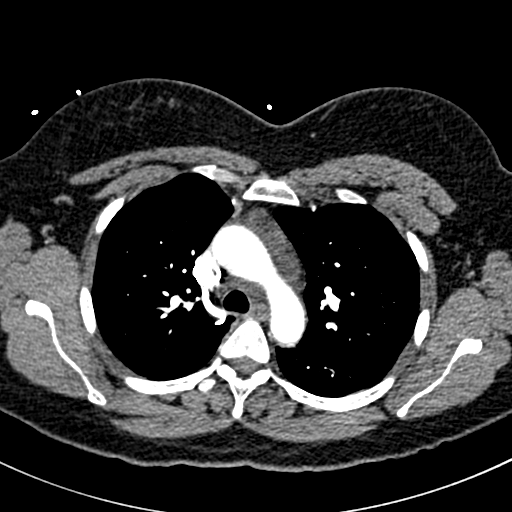
[im 205/260  lung]
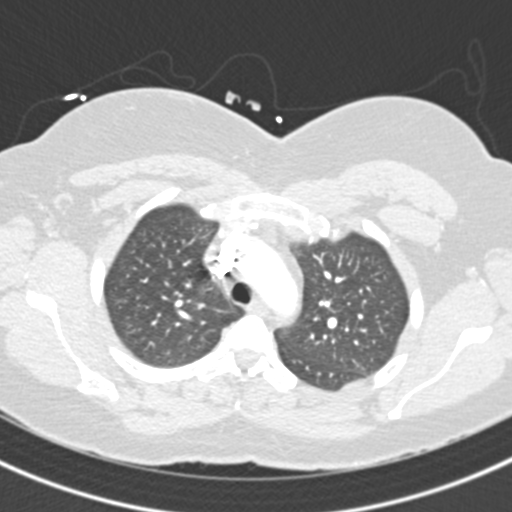
[im 219/260  mediastinal]
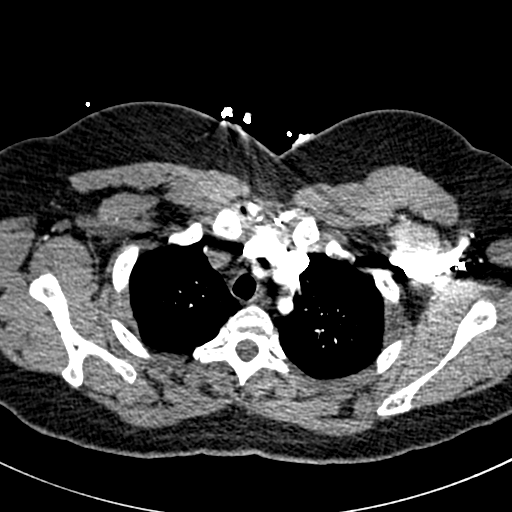
[im 232/260  lung]
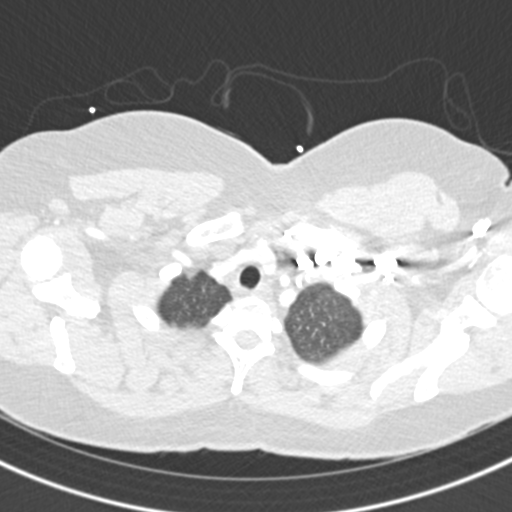
[im 246/260  mediastinal]
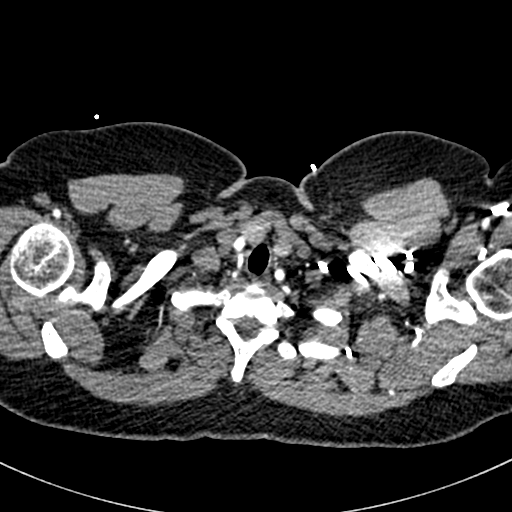

[Series 7: pe coronal mpr · coronal · 0.53mm/px · 1 of 110 slices shown]
[im 55/110  mediastinal]
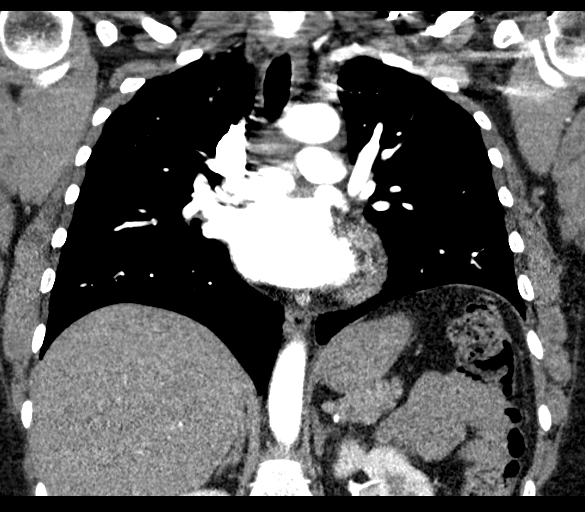

[19 of 36 positions shown; findings below may reference images not displayed]

RADIATION DOSE REDUCTION: This exam was performed according to the
departmental dose-optimization program which includes automated
exposure control, adjustment of the mA and/or kV according to
patient size and/or use of iterative reconstruction technique.

CONTRAST:  75mL OMNIPAQUE IOHEXOL 350 MG/ML SOLN
FINDINGS: Cardiovascular: Pulmonary arteries appear patent and normal in
caliber. Negative for significant acute filling defect or pulmonary
embolus by CTA.

Intact thoracic aorta. Patent 3 vessel arch anatomy. No aneurysm or
dissection. No mediastinal hemorrhage or hematoma. Normal heart
size. No pericardial effusion.

Central venous structures are patent.  No Sazo process.

Mediastinum/Nodes: No enlarged mediastinal, hilar, or axillary lymph
nodes. Thyroid gland, trachea, and esophagus demonstrate no
significant findings.

Lungs/Pleura: Lungs are clear. No pleural effusion or pneumothorax.

Upper Abdomen: No acute abnormality.

Musculoskeletal: No chest wall abnormality. No acute or significant
osseous findings.

Review of the MIP images confirms the above findings.
IMPRESSION: Negative for significant acute pulmonary embolus by CTA.

No other acute intrathoracic finding.

## 2023-06-24 SURGERY — LAPAROTOMY, EXPLORATORY
Anesthesia: Choice

## 2023-06-24 SURGERY — EVACUATION HEMATOMA
Anesthesia: General

## 2023-06-24 MED ORDER — ALBUMIN HUMAN 5 % IV SOLN
INTRAVENOUS | Status: DC | PRN
Start: 2023-06-24 — End: 2023-06-24

## 2023-06-24 MED ORDER — CHLORHEXIDINE GLUCONATE 0.12 % MT SOLN
15.0000 mL | Freq: Once | OROMUCOSAL | Status: AC
  Administered 2023-06-24: 15 mL via OROMUCOSAL
  Filled 2023-06-24: qty 15

## 2023-06-24 MED ORDER — ACETAMINOPHEN 10 MG/ML IV SOLN
1000.0000 mg | Freq: Once | INTRAVENOUS | Status: AC
  Administered 2023-06-24: 1000 mg via INTRAVENOUS

## 2023-06-24 MED ORDER — ROCURONIUM BROMIDE 10 MG/ML (PF) SYRINGE
PREFILLED_SYRINGE | INTRAVENOUS | Status: DC | PRN
  Administered 2023-06-24: 70 mg via INTRAVENOUS

## 2023-06-24 MED ORDER — HYDROMORPHONE HCL 1 MG/ML IJ SOLN
INTRAMUSCULAR | Status: AC
  Filled 2023-06-24: qty 1

## 2023-06-24 MED ORDER — SUGAMMADEX SODIUM 200 MG/2ML IV SOLN
INTRAVENOUS | Status: DC | PRN
  Administered 2023-06-24: 237.6 mg via INTRAVENOUS

## 2023-06-24 MED ORDER — ONDANSETRON HCL 4 MG/2ML IJ SOLN: INTRAMUSCULAR | Status: DC | PRN

## 2023-06-24 MED ORDER — FENTANYL CITRATE (PF) 250 MCG/5ML IJ SOLN
INTRAMUSCULAR | Status: AC
  Filled 2023-06-24: qty 5

## 2023-06-24 MED ORDER — LACTATED RINGERS IV SOLN: INTRAVENOUS | Status: DC

## 2023-06-24 MED ORDER — LABETALOL HCL 200 MG PO TABS
200.0000 mg | ORAL_TABLET | Freq: Three times a day (TID) | ORAL | Status: DC
  Filled 2023-06-24 (×4): qty 1

## 2023-06-24 MED ORDER — LIDOCAINE 2% (20 MG/ML) 5 ML SYRINGE
INTRAMUSCULAR | Status: DC | PRN
  Administered 2023-06-24: 60 mg via INTRAVENOUS

## 2023-06-24 MED ORDER — PHENYLEPHRINE HCL-NACL 20-0.9 MG/250ML-% IV SOLN
INTRAVENOUS | Status: AC
  Filled 2023-06-24: qty 250

## 2023-06-24 MED ORDER — CEFAZOLIN SODIUM-DEXTROSE 2-3 GM-%(50ML) IV SOLR
INTRAVENOUS | Status: DC | PRN
  Administered 2023-06-24: 2 g via INTRAVENOUS

## 2023-06-24 MED ORDER — PROPOFOL 10 MG/ML IV BOLUS
INTRAVENOUS | Status: DC | PRN
  Administered 2023-06-24: 150 mg via INTRAVENOUS
  Administered 2023-06-24: 50 mg via INTRAVENOUS

## 2023-06-24 MED ORDER — FENTANYL CITRATE (PF) 250 MCG/5ML IJ SOLN
INTRAMUSCULAR | Status: DC | PRN
  Administered 2023-06-24: 50 ug via INTRAVENOUS
  Administered 2023-06-24: 100 ug via INTRAVENOUS
  Administered 2023-06-24 (×2): 50 ug via INTRAVENOUS

## 2023-06-24 MED ORDER — HYDROMORPHONE HCL 1 MG/ML IJ SOLN
0.2500 mg | INTRAMUSCULAR | Status: DC | PRN
  Administered 2023-06-24 (×3): 0.5 mg via INTRAVENOUS

## 2023-06-24 MED ORDER — LIDOCAINE 2% (20 MG/ML) 5 ML SYRINGE
INTRAMUSCULAR | Status: AC
  Filled 2023-06-24: qty 5

## 2023-06-24 MED ORDER — ACETAMINOPHEN 10 MG/ML IV SOLN
INTRAVENOUS | Status: AC
  Filled 2023-06-24: qty 100

## 2023-06-24 MED ORDER — BUPIVACAINE LIPOSOME 1.3 % IJ SUSP
INTRAMUSCULAR | Status: DC | PRN
  Administered 2023-06-24: 20 mL

## 2023-06-24 MED ORDER — ONDANSETRON HCL 4 MG/2ML IJ SOLN
INTRAMUSCULAR | Status: DC | PRN
  Administered 2023-06-24: 4 mg via INTRAVENOUS

## 2023-06-24 MED ORDER — LACTATED RINGERS IV SOLN
INTRAVENOUS | Status: DC
Start: 2023-06-24 — End: 2023-06-24

## 2023-06-24 MED ORDER — PROPOFOL 10 MG/ML IV BOLUS
INTRAVENOUS | Status: AC
  Filled 2023-06-24: qty 20

## 2023-06-24 MED ORDER — MIDAZOLAM HCL 2 MG/2ML IJ SOLN
INTRAMUSCULAR | Status: AC
  Filled 2023-06-24: qty 2

## 2023-06-24 MED ORDER — ORAL CARE MOUTH RINSE: 15.0000 mL | Freq: Once | OROMUCOSAL | Status: AC

## 2023-06-24 MED ORDER — MIDAZOLAM HCL 2 MG/2ML IJ SOLN
INTRAMUSCULAR | Status: DC | PRN
  Administered 2023-06-24: 2 mg via INTRAVENOUS

## 2023-06-24 MED ORDER — BUPIVACAINE LIPOSOME 1.3 % IJ SUSP
INTRAMUSCULAR | Status: AC
  Filled 2023-06-24: qty 20

## 2023-06-24 MED ORDER — 0.9 % SODIUM CHLORIDE (POUR BTL) OPTIME
TOPICAL | Status: DC | PRN
  Administered 2023-06-24: 1000 mL

## 2023-06-24 MED ORDER — ROCURONIUM BROMIDE 10 MG/ML (PF) SYRINGE
PREFILLED_SYRINGE | INTRAVENOUS | Status: AC
  Filled 2023-06-24: qty 10

## 2023-06-24 SURGICAL SUPPLY — 37 items
BAG COUNTER SPONGE SURGICOUNT (BAG) ×1 IMPLANT
CANISTER SUCT 3000ML PPV (MISCELLANEOUS) ×1 IMPLANT
DERMABOND ADVANCED .7 DNX12 (GAUZE/BANDAGES/DRESSINGS) ×1 IMPLANT
DRAPE CESAREAN BIRTH W POUCH (DRAPES) ×1 IMPLANT
DRAPE WARM FLUID 44X44 (DRAPES) IMPLANT
DRSG OPSITE POSTOP 4X10 (GAUZE/BANDAGES/DRESSINGS) ×1 IMPLANT
DRSG TELFA 3X8 NADH STRL (GAUZE/BANDAGES/DRESSINGS) IMPLANT
DURAPREP 26ML APPLICATOR (WOUND CARE) ×1 IMPLANT
GAUZE 4X4 16PLY ~~LOC~~+RFID DBL (SPONGE) IMPLANT
GAUZE PAD ABD 7.5X8 STRL (GAUZE/BANDAGES/DRESSINGS) IMPLANT
GAUZE PAD ABD 8X10 STRL (GAUZE/BANDAGES/DRESSINGS) IMPLANT
GAUZE SPONGE 4X4 12PLY STRL (GAUZE/BANDAGES/DRESSINGS) IMPLANT
GLOVE BIO SURGEON STRL SZ 6.5 (GLOVE) ×1 IMPLANT
GLOVE BIOGEL PI IND STRL 6.5 (GLOVE) ×1 IMPLANT
GLOVE BIOGEL PI IND STRL 7.0 (GLOVE) ×2 IMPLANT
GOWN STRL REUS W/ TWL LRG LVL3 (GOWN DISPOSABLE) ×3 IMPLANT
HIBICLENS CHG 4% 4OZ BTL (MISCELLANEOUS) ×1 IMPLANT
KIT TURNOVER KIT B (KITS) ×1 IMPLANT
NDL HYPO 22X1.5 SAFETY MO (MISCELLANEOUS) ×1 IMPLANT
NEEDLE HYPO 22X1.5 SAFETY MO (MISCELLANEOUS) ×1 IMPLANT
NS IRRIG 1000ML POUR BTL (IV SOLUTION) ×1 IMPLANT
PACK ABDOMINAL GYN (CUSTOM PROCEDURE TRAY) ×1 IMPLANT
PAD OB MATERNITY 4.3X12.25 (PERSONAL CARE ITEMS) ×1 IMPLANT
PENCIL BUTTON HOLSTER BLD 10FT (ELECTRODE) ×1 IMPLANT
PROTECTOR NERVE ULNAR (MISCELLANEOUS) ×2 IMPLANT
SPIKE FLUID TRANSFER (MISCELLANEOUS) IMPLANT
SPONGE T-LAP 18X18 ~~LOC~~+RFID (SPONGE) IMPLANT
STAPLER SKIN PROX WIDE 3.9 (STAPLE) IMPLANT
SUT PLAIN ABS 2-0 CT1 27XMFL (SUTURE) IMPLANT
SUT VIC AB 0 CT1 18XCR BRD8 (SUTURE) ×2 IMPLANT
SUT VIC AB 0 CT1 27XBRD ANBCTR (SUTURE) ×2 IMPLANT
SYR 10ML LL (SYRINGE) ×1 IMPLANT
SYR BULB IRRIG 60ML STRL (SYRINGE) IMPLANT
SYR CONTROL 10ML LL (SYRINGE) IMPLANT
TAPE CLOTH SURG 4X10 WHT LF (GAUZE/BANDAGES/DRESSINGS) IMPLANT
TOWEL GREEN STERILE FF (TOWEL DISPOSABLE) ×2 IMPLANT
TRAY FOLEY W/BAG SLVR 14FR (SET/KITS/TRAYS/PACK) ×1 IMPLANT

## 2023-06-24 NOTE — Lactation Note (Signed)
This note was copied from a baby's chart.  NICU Lactation Consultation Note  Patient Name: Alexandra Escobar [id number] Date: 06/24/2023 Age:40 days  Reason for consult: Follow-up assessment; Primapara; 1st time breastfeeding; NICU baby; Late-preterm 34-36.6wks; Infant < 6lbs; Maternal endocrine disorder Type of Endocrine Disorder?: PCOS; Thyroid (hypothyroidism on Synthroid, AMA, IVF pregnancy)  SUBJECTIVE  LC in to visit with P1 Mom of baby "Alexandra Escobar" in the NICU.  Baby is currently ad lib feeding donor breast milk and mother's own milk by paced bottle.  Mom is currently waiting to be taken to OR for hematoma of incision repair.  Mom exhausted and closing her eyes.  GMOB in room as well.    Mom shared that 15 ml of EBM was taken to baby from last pumping.  Encouraged Mom to continue her consistent pumping, but to do her best considering her having to go to OR.    OBJECTIVE Infant data: No data recorded O2 Device: Room Air  Infant feeding assessment Scale for Readiness: 2 Scale for Quality: 3   Maternal data: [id number] C-Section, Low Transverse Significant Breast History:: ++ breast changes Current breast feeding challenges:: infant separation Pumping frequency: 6 times per 24 hrs Pumped volume: 15 mL Flange Size: 18 Hands-free pumping top sizes: Small/Medium (Blue) Risk factor for low/delayed milk supply:: Infant admitted to NICU, separation  Pump: Hands Free, Copywriter, advertising, Refer for rental [employer])  Feeding Status: Ad lib Feeding method: Bottle Nipple Type: Dr. Levert Feinstein Preemie  Maternal: Milk volume: Low  INTERVENTIONS/PLAN Interventions: Interventions: Skin to skin; Breast massage; Hand express; DEBP; Education Discharge Education: Engorgement and breast care Tools: Pump; Flanges; Hands-free pumping top Pump Education: Setup, frequency, and cleaning; Milk Storage  Plan: Consult Status: NICU follow-up NICU Follow-up type: Verify absence of engorgement; Verify  onset of copious milk; Maternal D/C visit   Judee Clara 06/24/2023, 12:20 PM

## 2023-06-24 NOTE — Progress Notes (Addendum)
Seen in the PACU for post op examination.  Doing well. Exam benign - dressing in place. Appropriately tender. CBC in am (hgb 7.3 this am - so stably low - currently asymptomatic) CRT slightly bumped this am 1.10 - repeat in AM.  Will hold further abx given no s/s infection at this time.    Rosie Fate MD

## 2023-06-24 NOTE — Progress Notes (Signed)
She continues to have pain in the left aspect on incision.  Kerlex removed. No active bleeding noted.  Ecchymosis of skin on left aspect - tight and c/w underlying blood clot.  I suspect a superficial incisional hematoma.  We discussed bedside evacuationvs OR and she prefers to go to the OR.  She has been NPO. I anticipate opening of skin, evacuation of subcutaneous hematoma, and re-closure vs open packing. However, an ex-lap with any additional procedures if possible if it is felt that bleeding is intraabdominal. I have low suspicion for that. We discussed the possibility of leaving the OR with an open wound if unable to safely re-approximate healthy edges of tissue. I discussed using staples instead of sutures. I discussed the possibility of leaving a drain in place. Risks discussed including infection, bleeding, damage to surrounding structures, the need for additional procedures including hysterectomy, scarring, etc. Patient agrees to proceed. She has been on IV Cefotan q 6 hours - last dose at 1037am which will cover surgery as long as not > 3 hours.    Rosie Fate MD

## 2023-06-24 NOTE — Transfer of Care (Signed)
Immediate Anesthesia Transfer of Care Note  Patient: Alexandra Escobar  Procedure(s) Performed: EVACUATION HEMATOMA INCISIONAL  Patient Location: PACU  Anesthesia Type:General  Level of Consciousness: awake, alert , and oriented  Airway & Oxygen Therapy: Patient Spontanous Breathing and Patient connected to face mask oxygen  Post-op Assessment: Report given to RN and Post -op Vital signs reviewed and stable  Post vital signs: Reviewed and stable  Last Vitals:  Vitals Value Taken Time  BP 133/65 06/24/23 1537  Temp    Pulse 97 06/24/23 1538  Resp 20 06/24/23 1538  SpO2 99 % 06/24/23 1538  Vitals shown include unfiled device data.  Last Pain:  Vitals:   06/24/23 1322  TempSrc: Oral  PainSc: 7       Patients Stated Pain Goal: 3 (06/19/23 0815)  Complications: No notable events documented.

## 2023-06-24 NOTE — Anesthesia Procedure Notes (Signed)
Procedure Name: Intubation Date/Time: 06/24/2023 2:31 PM  Performed by: Ammie Dalton, CRNAPre-anesthesia Checklist: Patient identified, Emergency Drugs available, Suction available and Patient being monitored Patient Re-evaluated:Patient Re-evaluated prior to induction Oxygen Delivery Method: Circle System Utilized Preoxygenation: Pre-oxygenation with 100% oxygen Induction Type: IV induction Ventilation: Two handed mask ventilation required and Oral airway inserted - appropriate to patient size Laryngoscope Size: Mac and 4 Grade View: Grade III Tube type: Oral Tube size: 7.0 mm Number of attempts: 1 Airway Equipment and Method: Stylet, Oral airway and Bougie stylet Placement Confirmation: ETT inserted through vocal cords under direct vision, positive ETCO2 and breath sounds checked- equal and bilateral Secured at: 21 cm Tube secured with: Tape Dental Injury: Teeth and Oropharynx as per pre-operative assessment

## 2023-06-24 NOTE — Anesthesia Preprocedure Evaluation (Addendum)
Anesthesia Evaluation  Patient identified by MRN, date of birth, ID band Patient awake    Reviewed: Allergy & Precautions, H&P , NPO status , Patient's Chart, lab work & pertinent test results  Airway Mallampati: III  TM Distance: >3 FB Neck ROM: Full    Dental no notable dental hx. (+) Teeth Intact, Dental Advisory Given   Pulmonary neg pulmonary ROS   Pulmonary exam normal breath sounds clear to auscultation       Cardiovascular hypertension, Pt. on medications  Rhythm:Regular Rate:Normal     Neuro/Psych   Anxiety Depression    negative neurological ROS     GI/Hepatic negative GI ROS, Neg liver ROS,,,  Endo/Other  Hypothyroidism  Class 3 obesity  Renal/GU negative Renal ROS  negative genitourinary   Musculoskeletal   Abdominal   Peds  Hematology negative hematology ROS (+)   Anesthesia Other Findings   Reproductive/Obstetrics negative OB ROS                             Anesthesia Physical Anesthesia Plan  ASA: 3  Anesthesia Plan: General   Post-op Pain Management: Tylenol PO (pre-op)*   Induction: Intravenous  PONV Risk Score and Plan: 4 or greater and Ondansetron, Dexamethasone and Midazolam  Airway Management Planned: Oral ETT  Additional Equipment:   Intra-op Plan:   Post-operative Plan: Extubation in OR  Informed Consent: I have reviewed the patients History and Physical, chart, labs and discussed the procedure including the risks, benefits and alternatives for the proposed anesthesia with the patient or authorized representative who has indicated his/her understanding and acceptance.     Dental advisory given  Plan Discussed with: CRNA  Anesthesia Plan Comments:        Anesthesia Quick Evaluation

## 2023-06-24 NOTE — Anesthesia Preprocedure Evaluation (Signed)
Anesthesia Evaluation    Airway        Dental   Pulmonary           Cardiovascular hypertension,   TTE 06/18/2023: IMPRESSIONS    1. Limited Echo to evaluate for LVEF and pericardial effusion.   2. Left ventricular ejection fraction, by estimation, is 60 to 65%. The  left ventricle has normal function. The left ventricle has no regional  wall motion abnormalities. Left ventricular diastolic function could not  be evaluated.   3. Right ventricular systolic function is normal. The right ventricular  size is normal.   4. The inferior vena cava is normal in size with greater than 50%  respiratory variability, suggesting right atrial pressure of 3 mmHg.   5. No pericardial effusion.     Neuro/Psych  PSYCHIATRIC DISORDERS Anxiety Depression       GI/Hepatic Fatty liver   Endo/Other  Hypothyroidism  Class 3 obesity  Renal/GU      Musculoskeletal   Abdominal   Peds  Hematology Lab Results      Component                Value               Date                      WBC                      12.7 (H)            06/23/2023                HGB                      7.4 (L)             06/23/2023                HCT                      22.1 (L)            06/23/2023                MCV                      100.0               06/23/2023                PLT                      372                 06/23/2023              Anesthesia Other Findings 42 y.o. female with PMH hypertension, severe preeclampsia presented with DOE and weight gain, underwent c-section, now with persistent edema undergoing diuresis. Potential for discharge home tomorrow.  Reproductive/Obstetrics Endometriosis                               Anesthesia Physical Anesthesia Plan Anesthesia Quick Evaluation

## 2023-06-24 NOTE — Progress Notes (Signed)
Brief cardiology progress note:  Patient returned to OR today for clot evacuation. BP 105/58 this AM, decreased labetalol dose. We will follow up with final recs tomorrow AM.   Jodelle Red, MD, PhD, [facility]  [facility]  [place]  [facility] at [facility] [address] [place], [place] [address] [phone]

## 2023-06-24 NOTE — Op Note (Addendum)
DATE OF OPERATION:  03/20/16 PREOPERATIVE DIAGNOSES: 1. Incisional hematoma  POSTOPERATIVE DIAGNOSIS: 1. same  OPERATION PERFORMED:  SURGEON:  Belva Agee, MD  ASSISTANT: Dr. Wylene Simmer  ANESTHESIA:  General.  OPERATIVE FINDINGS: 1.  300cc old clot in subcutaneous tissue  SPECIMEN SENT: None   ESTIMATED BLOOD LOSS: 200cc (500cc with old clot)  INDICATIONS FOR OPERATION: The patient is a 42 yo POD#7 s/p CS. She was found to have an incisional hematoma. She was at first managed conservatively with packing. However, it became evident that she had continued clot formation vs an unevacuated hematoma. All risks and benefits of the surgery had been discussed with the patient. All her questions were answered. The patient was taken to the operating room in stable condition.  DESCRIPTION OF OPERATION: The patient was taken to the OR where she was placed under general anesthesia without difficulty. She was then prepped and draped in the usual sterile fashion and placed in the dorsal lithotomy position. Foley placed sterily. Incision was gently opened by cutting old suture. Immediately, ~300cc of old clot was expelled. Some generalized oozing noted. Copiously irrigated. No pus or signs of infection.  Fascia probbed thoroughly - completely intact. No welling up of blood from underneath - I watched the bottom of the incision for several minutes. Most tissue looked very healthy. I used a sponge to create a clean layer to help granulate. The edges of the clean subcutaneous tissue were brought together in two separate layers with interrupted stitches with plain gut suture. Resaparel was poured into the area and injected along the skin layer. Of note, there was still some firmness on the incision edge which seems to be edema. No other s/s of infection. No active bleeding. The edges of the skin were brought together with staples. NO active bleeding noted. A pressure dressing was placed.   The patient  tolerated the operation nicely and was then taken to the Recovery Room in good condition.    Belva Agee MD

## 2023-06-24 NOTE — Anesthesia Postprocedure Evaluation (Signed)
Anesthesia Post Note  Patient: Alexandra Escobar  Procedure(s) Performed: EVACUATION HEMATOMA INCISIONAL     Patient location during evaluation: PACU Anesthesia Type: General Level of consciousness: awake and alert Pain management: pain level controlled Vital Signs Assessment: post-procedure vital signs reviewed and stable Respiratory status: spontaneous breathing, nonlabored ventilation and respiratory function stable Cardiovascular status: blood pressure returned to baseline and stable Postop Assessment: no apparent nausea or vomiting Anesthetic complications: no  No notable events documented.  Last Vitals:  Vitals:   06/24/23 1615 06/24/23 1630  BP: 118/77 127/77  Pulse: 88 95  Resp: 15 16  Temp:  36.7 C  SpO2: 94% 95%    Last Pain:  Vitals:   06/24/23 1630  TempSrc:   PainSc: Asleep                 Azizi Bally,W. EDMOND

## 2023-06-25 ENCOUNTER — Inpatient Hospital Stay (HOSPITAL_COMMUNITY): Payer: BC Managed Care – PPO

## 2023-06-25 ENCOUNTER — Encounter (HOSPITAL_COMMUNITY): Payer: Self-pay | Admitting: Obstetrics and Gynecology

## 2023-06-25 LAB — URINALYSIS, ROUTINE W REFLEX MICROSCOPIC
Bilirubin Urine: NEGATIVE
Glucose, UA: NEGATIVE mg/dL
Hgb urine dipstick: NEGATIVE
Ketones, ur: NEGATIVE mg/dL
Leukocytes,Ua: NEGATIVE
Nitrite: NEGATIVE
Protein, ur: 100 mg/dL — AB
Specific Gravity, Urine: 1.025 (ref 1.005–1.030)
pH: 6 (ref 5.0–8.0)

## 2023-06-25 LAB — CBC
HCT: 22.4 % — ABNORMAL LOW (ref 36.0–46.0)
Hemoglobin: 7.4 g/dL — ABNORMAL LOW (ref 12.0–15.0)
MCH: 33 pg (ref 26.0–34.0)
MCHC: 33 g/dL (ref 30.0–36.0)
MCV: 100 fL (ref 80.0–100.0)
Platelets: 417 10*3/uL — ABNORMAL HIGH (ref 150–400)
RBC: 2.24 MIL/uL — ABNORMAL LOW (ref 3.87–5.11)
RDW: 16.2 % — ABNORMAL HIGH (ref 11.5–15.5)
WBC: 14.2 10*3/uL — ABNORMAL HIGH (ref 4.0–10.5)
nRBC: 0.7 % — ABNORMAL HIGH (ref 0.0–0.2)

## 2023-06-25 LAB — COMPREHENSIVE METABOLIC PANEL
ALT: 21 U/L (ref 0–44)
AST: 30 U/L (ref 15–41)
Albumin: 2.2 g/dL — ABNORMAL LOW (ref 3.5–5.0)
Alkaline Phosphatase: 80 U/L (ref 38–126)
Anion gap: 8 (ref 5–15)
BUN: 9 mg/dL (ref 6–20)
CO2: 25 mmol/L (ref 22–32)
Calcium: 7.9 mg/dL — ABNORMAL LOW (ref 8.9–10.3)
Chloride: 102 mmol/L (ref 98–111)
Creatinine, Ser: 0.84 mg/dL (ref 0.44–1.00)
GFR, Estimated: >60 mL/min (ref >60)
Glucose, Bld: 107 mg/dL — ABNORMAL HIGH (ref 70–99)
Potassium: 4.1 mmol/L (ref 3.5–5.1)
Sodium: 135 mmol/L (ref 135–145)
Total Bilirubin: 0.3 mg/dL (ref <1.2)
Total Protein: 5.3 g/dL — ABNORMAL LOW (ref 6.5–8.1)

## 2023-06-25 MED ORDER — ACETAMINOPHEN 500 MG PO TABS
1000.0000 mg | ORAL_TABLET | Freq: Four times a day (QID) | ORAL | Status: DC
  Administered 2023-06-25 – 2023-06-27 (×8): 1000 mg via ORAL
  Filled 2023-06-25 (×8): qty 2

## 2023-06-25 MED ORDER — CLINDAMYCIN PHOSPHATE 900 MG/50ML IV SOLN: 900.0000 mg | Freq: Three times a day (TID) | INTRAVENOUS | Status: DC

## 2023-06-25 MED ORDER — ALBUTEROL SULFATE (2.5 MG/3ML) 0.083% IN NEBU: 2.5000 mg | INHALATION_SOLUTION | Freq: Once | RESPIRATORY_TRACT | Status: DC | PRN

## 2023-06-25 MED ORDER — PIPERACILLIN-TAZOBACTAM 3.375 G IVPB
3.3750 g | Freq: Three times a day (TID) | INTRAVENOUS | Status: DC
  Administered 2023-06-25 – 2023-06-26 (×3): 3.375 g via INTRAVENOUS
  Filled 2023-06-25 (×3): qty 50

## 2023-06-25 MED ORDER — ACETAMINOPHEN 500 MG PO TABS
1000.0000 mg | ORAL_TABLET | Freq: Four times a day (QID) | ORAL | Status: DC | PRN
  Administered 2023-06-25: 1000 mg via ORAL
  Filled 2023-06-25: qty 2

## 2023-06-25 MED ORDER — SODIUM CHLORIDE 0.9 % IV BOLUS
500.0000 mL | Freq: Once | INTRAVENOUS | Status: DC | PRN
Start: 2023-06-25 — End: 2023-06-27

## 2023-06-25 MED ORDER — IBUPROFEN 600 MG PO TABS
600.0000 mg | ORAL_TABLET | Freq: Four times a day (QID) | ORAL | Status: DC
  Administered 2023-06-25 – 2023-06-27 (×8): 600 mg via ORAL
  Filled 2023-06-25 (×8): qty 1

## 2023-06-25 MED ORDER — POLYETHYLENE GLYCOL 3350 17 G PO PACK
17.0000 g | PACK | Freq: Every day | ORAL | Status: DC
  Administered 2023-06-25 – 2023-06-26 (×2): 17 g via ORAL
  Filled 2023-06-25 (×2): qty 1

## 2023-06-25 MED ORDER — GENTAMICIN SULFATE 40 MG/ML IJ SOLN: 5.0000 mg/kg | INTRAVENOUS | Status: DC

## 2023-06-25 MED ORDER — EPINEPHRINE PF 1 MG/ML IJ SOLN: 0.3000 mg | Freq: Once | INTRAMUSCULAR | Status: DC | PRN

## 2023-06-25 MED ORDER — IBUPROFEN 600 MG PO TABS
600.0000 mg | ORAL_TABLET | Freq: Four times a day (QID) | ORAL | Status: DC | PRN
  Administered 2023-06-25: 600 mg via ORAL
  Filled 2023-06-25: qty 1

## 2023-06-25 MED ORDER — CYCLOBENZAPRINE HCL 10 MG PO TABS
10.0000 mg | ORAL_TABLET | Freq: Three times a day (TID) | ORAL | Status: DC | PRN
  Administered 2023-06-25 – 2023-06-26 (×3): 10 mg via ORAL
  Filled 2023-06-25 (×3): qty 1

## 2023-06-25 MED ORDER — SODIUM CHLORIDE 0.9 % IV SOLN
500.0000 mg | Freq: Once | INTRAVENOUS | Status: AC
  Administered 2023-06-25: 500 mg via INTRAVENOUS
  Filled 2023-06-25: qty 25

## 2023-06-25 MED ORDER — SODIUM CHLORIDE 0.9 % IV SOLN
INTRAVENOUS | Status: AC | PRN
Start: 2023-06-25 — End: 2023-06-26

## 2023-06-25 MED ORDER — DIPHENHYDRAMINE HCL 50 MG/ML IJ SOLN
25.0000 mg | Freq: Once | INTRAMUSCULAR | Status: DC | PRN
Start: 2023-06-25 — End: 2023-06-27

## 2023-06-25 MED ORDER — IOHEXOL 350 MG/ML SOLN
75.0000 mL | Freq: Once | INTRAVENOUS | Status: AC | PRN
  Administered 2023-06-25: 75 mL via INTRAVENOUS

## 2023-06-25 MED ORDER — METHYLPREDNISOLONE SODIUM SUCC 125 MG IJ SOLR: 125.0000 mg | Freq: Once | INTRAMUSCULAR | Status: DC | PRN

## 2023-06-25 NOTE — Consult Note (Signed)
[facility] Liaison Consult Note    06/25/2023  Alexandra Escobar Sep 14, 1980 [id number]  *Value[facility]Based Care Institute [VBCI] remote coverage review for patient admitted to [facility].  Primary Care Provider: Etta Grandchild, MD with [facility] which is listed to provide transition of care follow up.  Insurance:  [employer], patient with [employer]  Referral base: Inpatient [facility] RNCM received for patient community follow up for hypertension and cardiac follow up.   We have reviewed your referral request and assigned this patient for outreach. Referred to:  VBCI [id number]      Notes on Referral:  Received a secure chat from inpatient [facility] RNCM regarding post partum patient.   Called and spoke with patient at bedside phone [facility] operator assisted call], HIPAA verified. Explained referral received and for post hospital monitoring follow up can be provided regarding blood pressure and cardiac needs.  Patient confirms PCP, and explained she can determine if follow up calls should be ongoing and not obligated.  Patient voiced understanding.  Plan: will make referral to referral  For questions,  Charlesetta Shanks, RN, BSN, CCM [place], Population Health, [facility] Liaison Direct Dial: [phone] or secure chat Email: [email or web address]        .

## 2023-06-25 NOTE — Progress Notes (Deleted)
Will also send UA and UCX to ensure no uti as she has been categorized. No s/s of uti. However, will ensure this is not a contributing cause.

## 2023-06-25 NOTE — Progress Notes (Addendum)
Pt reports pain is well controlled. She denies chills. She has been ambulating but did report dizziness. She has her Foley on still. She is passing flatus. She is tolerating a general diet. Last ate at 10pm last night.  Her pressure dressing remains in place without any strike through.  Give new onset fever (even after several doses of cephalosporins), new leukocytosis, etc, I recommend initiating IV gent and clinda and doing a CT scan.  CRT has normalized. Uop excellent.  Send UA and UCX. If everything else normal, can consider CXR to exclude pneumonia.  Plan was dw pt in detail. Questions answered.  Rosie Fate MD

## 2023-06-25 NOTE — Progress Notes (Signed)
Progress Note  At bedside to discuss CT and XR results.  Patient feeling well, VSS - mild tachycardia, satting well on room air. Pain well-controlled. We discussed normal CXR and normal postoperative CT A/P without evidence of intraabdominal infection.  Pressure dressing was removed - incision appears well-approximated, staples in place. No surrounding drainage. Some residual induration and ecchymosis. Patient reports significant decrease in tenderness.  I discussed with her my suspicion for mild developing cellulitis as source of her postop fever. Will plan to continue IV zosyn x24 hours given her complicated postoperative course and likely transition to PO antibiotic tomorrow AM if remains afebrile and with continued clinical improvement. Will remove foley, change diet to regular, and dc IV fluids.  Given postop anemia, will give IV iron dose. Continue close monitoring. Clinical status improved.  Jule Economy, MD

## 2023-06-25 NOTE — Lactation Note (Signed)
This note was copied from a baby's chart.  NICU Lactation Consultation Note  Patient Name: Alexandra Escobar [id number] Date: 06/25/2023 Age:40 days  Reason for consult: Follow-up assessment; Primapara; 1st time breastfeeding; NICU baby; Late-preterm 34-36.6wks; Maternal endocrine disorder; Infant < 6lbs; Other (Comment) (AMA, IVF) Type of Endocrine Disorder?: PCOS; Diabetes (hypothyroid (synthroid))  SUBJECTIVE Visited with family of 58 75/23 weeks old AGA NICU female; Ms. Maden is a P1 and reported that pumping has not been consistent due to having surgery yesterday for an hematoma on her incision. She had questions regarding her medications and breastmilk but all medications are compatible with breastfeeding at this time. Baby "Alexandra Escobar" already on ad lib status with donor milk feedings; she said she would like to take baby back to breast once she's ready, she latched once with [facility]. Revised pumping schedule, pump settings and anticipatory guidelines.  OBJECTIVE Infant data: Mother's Current Feeding Choice: Breast Milk and Donor Milk  O2 Device: Room Air  Infant feeding assessment Scale for Readiness: 2 Scale for Quality: 3   Maternal data: [id number] C-Section, Low Transverse Pumping frequency: 1 time/24 hours due to having surgery yesterday Pumped volume: 15 mL Flange Size: 18 Hands-free pumping top sizes: Small/Medium (Blue)  Pump: Hands Free, Personal, Refer for rental ([employer])  ASSESSMENT Infant: Feeding Status: Ad lib Feeding method: Bottle Nipple Type: Dr. Levert Feinstein Preemie  Maternal: Milk volume: Low  INTERVENTIONS/PLAN Interventions: Interventions: Breast feeding basics reviewed; Coconut oil; DEBP; Education Discharge Education: Engorgement and breast care Tools: Pump; Flanges; Hands-free pumping top; Coconut oil Pump Education: Setup, frequency, and cleaning; Milk Storage  Plan: Encouraged pumping on maintain every 3 hours, ideally 8 pumping sessions/24  hours; she might go at her own pace as she's recovering from surgery She'll call for assistance when she's ready to take baby "Alexandra Escobar" to breast  No other support person at this time. All questions and concerns answered, family to contact [facility] services PRN.  Consult Status: NICU follow-up NICU Follow-up type: Verify absence of engorgement; Verify onset of copious milk; Maternal D/C visit   Oshay Stranahan Venetia Constable 06/25/2023, 11:35 AM

## 2023-06-25 NOTE — Progress Notes (Addendum)
Brief Progress Note  Last few Bps have been 110s-120s/50s. Will hold labetalol for now with close BP monitoring. Spoke with pharmacy re antibiotics. Given she was last on IV cefotetan and febrile immediately after, will continue zosyn. We discussed that given suspicion for cellulitis, could switch to vancomycin if clinically worsens or becomes febrile again for MRSA coverage. Will continue zosyn for now.  Jule Economy, MD

## 2023-06-25 NOTE — TOC Progression Note (Addendum)
Transition of Care [facility]) - Progression Note    Patient Details  Name: Alexandra Escobar MRN: [id number] Date of Birth: Apr 06, 1981  Transition of Care [facility]) CM/SW Contact  Geoffery Lyons, RN Phone Number: 06/25/2023, 2:15 PM  Clinical Narrative:      CM continues to follow. Patient to the OR and incision is now closed. CM followed up with MD. Home Health services will not be needed per MD. [facility] notified.   [facility] liasion notified that patient is in house to follow                                                     Social Determinants of Health (SDOH) Interventions SDOH Screenings   Food Insecurity: No Food Insecurity (06/17/2023)  Housing: Low Risk  (06/17/2023)  Transportation Needs: No Transportation Needs (06/17/2023)  Utilities: Not At Risk (06/17/2023)  Depression (PHQ2-9): Low Risk  (05/01/2023)  Financial Resource Strain: Low Risk  (04/30/2023)  Physical Activity: Unknown (04/30/2023)  Social Connections: Moderately Integrated (04/30/2023)  Stress: No Stress Concern Present (04/30/2023)  Tobacco Use: Low Risk  (06/24/2023)    Readmission Risk Interventions     No data to display        Gretchen Short RNC-MNN, BSN Transitions of Care [facility]

## 2023-06-25 NOTE — Progress Notes (Addendum)
Per pharmacy, they recommend pseudomonas coverage as well given fever of unknown etiology.  They recommend zosyn instead of gent/clinda. I support this choice.   Rosie Fate MD

## 2023-06-25 NOTE — Progress Notes (Signed)
Rounding Note    Patient Name: Alexandra Escobar Date of Encounter: 06/25/2023  [facility] Cardiologist: Thomasene Ripple, DO   Subjective   Has had active 24-48 hours with OR for clot evacuation, then spiked a fever, has been imaged and treated with IV antibiotics. Labetalol stopped given well controlled BP and concern for infection resulting in hypotension. Remains on torsemide with good urine output.  Inpatient Medications    Scheduled Meds:  acetaminophen  1,000 mg Oral Q6H   ibuprofen  600 mg Oral Q6H   levothyroxine  50 mcg Oral QAC breakfast   mirtazapine  15 mg Oral QHS   polyethylene glycol  17 g Oral Daily   prenatal multivitamin  1 tablet Oral Q1200   senna-docusate  2 tablet Oral Daily   sertraline  150 mg Oral Daily   simethicone  80 mg Oral TID PC   torsemide  20 mg Oral Daily   Continuous Infusions:  sodium chloride     piperacillin-tazobactam (ZOSYN)  IV 3.375 g (06/25/23 1825)   sodium chloride     PRN Meds: sodium chloride, albuterol, coconut oil, cyclobenzaprine, witch hazel-glycerin **AND** dibucaine, diphenhydrAMINE, diphenhydrAMINE, EPINEPHrine, HYDROmorphone (DILAUDID) injection, menthol-cetylpyridinium, methylPREDNISolone (SOLU-MEDROL) injection, oxyCODONE, simethicone, sodium chloride, zolpidem   Vital Signs    Vitals:   06/25/23 0936 06/25/23 0941 06/25/23 1111 06/25/23 1549  BP:   (!) 119/51 138/66  Pulse:   (!) 103 (!) 107  Resp:    20  Temp:   98.7 F (37.1 C) 98.7 F (37.1 C)  TempSrc:   Oral Oral  SpO2: 92% 94% 95% 95%  Weight:      Height:        Intake/Output Summary (Last 24 hours) at 06/25/2023 1856 Last data filed at 06/25/2023 1730 Gross per 24 hour  Intake 0 ml  Output 2480 ml  Net -2480 ml      06/25/2023    5:00 AM 06/24/2023    1:22 PM 06/24/2023    6:20 AM  Last 3 Weights  Weight (lbs) 258 lb 12.8 oz 262 lb 259 lb 6.4 oz  Weight (kg) 117.391 kg 118.842 kg 117.663 kg      Telemetry    Not on  telemetry  Physical Exam   GEN: No acute distress.   Neck: No JVD Cardiac: RRR, no murmurs, rubs, or gallops.  Respiratory: Clear to auscultation bilaterally. GI: Soft, nontender, non-distended, +BS, bandaged c section incision site MS: bilateral 1+ LE edema; improving Neuro:  Nonfocal  Psych: Normal affect   New pertinent results (labs, ECG, imaging, cardiac studies)     Patient Profile     42 y.o. female with PMH hypertension, severe preeclampsia presented with DOE and weight gain, underwent c-section, now with persistent edema consistent with acute diastolic heart failure  Assessment & Plan    Severe pre-eclampsia s/p c section 06/18/23 at 35 weeks Chronic hypertension -gained total of 114 lbs with pregnancy per notes. Weight on admission (before delivery) 128.9 kg. -weight today 117.4 kg, down 11.5 kg (25 lbs) -I/O inaccurate but reports brisk urine output -limited echo 11/28 with normal LVEF 60-65%, RAP 3 mmHg (IVC normal and collapses). RV normal.  -echo 06/05/23 with normal diastology -overall likely fluid retention from pregnancy now releasing. Not consistent with diastolic heart failure. Lungs are clear. -blood pressure much improved, within normal range currently. Labetalol on hold today given concern for infection that might precipitate hypotension -much improved urine output on torsemide compared to lasix -Cr within  recent range -Hgb drifting down, from 12.2 preop to 7.4 today. Management per primary team  Her fever/infection today and clot evacuation in the OR yesterday are main issues needing monitoring. From a cardiac standpoint, we will follow her BP tomorrow to determine if labetalol needs to be re-added. Would continue torsemide at discharge until her edema resolves--may take up to several weeks. She will miss her appointment with Dr. Servando Salina 12/6 but we will reschedule for her.    Signed, Jodelle Red, MD  06/25/2023,

## 2023-06-25 NOTE — Progress Notes (Signed)
Postpartum Progress Note  S: Feeling much improved. Pain well-controlled. On the way to CT/XR. No chills. Was placed on 1L [place] overnight during pain episode, now satting 96-97%. No chest pain, SOB. Febrile overnight to 100.7, started on IV zosyn. Imaging pending.  O:     06/25/2023    7:45 AM 06/25/2023    5:48 AM 06/25/2023    5:00 AM  Vitals with BMI  Weight   258 lbs 13 oz  BMI   47.32  Systolic 129 128   Diastolic 56 55   Pulse 106 118    Gen: NAD, A&O Pulm: NWOB Abd: soft, appropriately ttp, fundus firm and below Umb. Pressure dressing in place. Ext: No evidence of DVT, 1+ pitting edema b/l  UOP appropriate  Labs Recent Results (from the past 2160 hour(s))  Protein / creatinine ratio, urine     Status: None   Collection Time: 03/28/23 11:24 PM  Result Value Ref Range   Creatinine, Urine 70 mg/dL   Total Protein, Urine 7 mg/dL    Comment: NO NORMAL RANGE ESTABLISHED FOR THIS TEST   Protein Creatinine Ratio 0.10 0.00 - 0.15 mg/mg[Cre]    Comment: Performed at [facility], [address] [place], [place] [address]  Urinalysis, Routine w reflex microscopic -Urine, Clean Catch     Status: Abnormal   Collection Time: 03/28/23 11:35 PM  Result Value Ref Range   Color, Urine YELLOW YELLOW   APPearance HAZY (A) CLEAR   Specific Gravity, Urine 1.013 1.005 - 1.030   pH 7.0 5.0 - 8.0   Glucose, UA NEGATIVE NEGATIVE mg/dL   Hgb urine dipstick NEGATIVE NEGATIVE   Bilirubin Urine NEGATIVE NEGATIVE   Ketones, ur NEGATIVE NEGATIVE mg/dL   Protein, ur NEGATIVE NEGATIVE mg/dL   Nitrite NEGATIVE NEGATIVE   Leukocytes,Ua NEGATIVE NEGATIVE    Comment: Performed at [facility], [address] [place], [place] [address]  CBC     Status: Abnormal   Collection Time: 03/28/23 11:36 PM  Result Value Ref Range   WBC 15.9 (H) 4.0 - 10.5 K/uL   RBC 3.60 (L) 3.87 - 5.11 MIL/uL   Hemoglobin 11.7 (L) 12.0 - 15.0 g/dL   HCT 01.0 (L) 27.2 - 53.6 %   MCV 92.8 80.0 - 100.0 fL    MCH 32.5 26.0 - 34.0 pg   MCHC 35.0 30.0 - 36.0 g/dL   RDW 64.4 03.4 - 74.2 %   Platelets 319 150 - 400 K/uL   nRBC 0.0 0.0 - 0.2 %    Comment: Performed at [facility], [address] [place], [place] [address]  Comprehensive metabolic panel     Status: Abnormal   Collection Time: 03/28/23 11:36 PM  Result Value Ref Range   Sodium 132 (L) 135 - 145 mmol/L   Potassium 3.9 3.5 - 5.1 mmol/L   Chloride 105 98 - 111 mmol/L   CO2 19 (L) 22 - 32 mmol/L   Glucose, Bld 108 (H) 70 - 99 mg/dL    Comment: Glucose reference range applies only to samples taken after fasting for at least 8 hours.   BUN 8 6 - 20 mg/dL   Creatinine, Ser 8.75 0.44 - 1.00 mg/dL   Calcium 8.3 (L) 8.9 - 10.3 mg/dL   Total Protein 5.8 (L) 6.5 - 8.1 g/dL   Albumin 2.6 (L) 3.5 - 5.0 g/dL   AST 24 15 - 41 U/L   ALT 21 0 - 44 U/L  Alkaline Phosphatase 75 38 - 126 U/L   Total Bilirubin <0.1 (L) 0.3 - 1.2 mg/dL   GFR, Estimated >16 >10 mL/min    Comment: (NOTE) Calculated using the CKD-EPI Creatinine Equation (2021)    Anion gap 8 5 - 15    Comment: Performed at [facility], [address], [place], [place] [address]  POC COVID-19 BinaxNow     Status: None   Collection Time: 05/01/23  9:40 AM  Result Value Ref Range   SARS Coronavirus 2 Ag Negative Negative  POCT rapid strep A     Status: None   Collection Time: 05/01/23 10:40 AM  Result Value Ref Range   Rapid Strep A Screen Negative Negative  Resp panel by RT-PCR (RSV, Flu A&B, Covid) Anterior Nasal Swab     Status: None   Collection Time: 05/15/23  3:00 AM   Specimen: Anterior Nasal Swab  Result Value Ref Range   SARS Coronavirus 2 by RT PCR NEGATIVE NEGATIVE   Influenza A by PCR NEGATIVE NEGATIVE   Influenza B by PCR NEGATIVE NEGATIVE    Comment: (NOTE) The Xpert Xpress SARS-CoV-2/FLU/RSV plus assay is intended as an aid in the diagnosis of influenza from Nasopharyngeal swab specimens and should not be used as a sole basis for treatment.  Nasal washings and aspirates are unacceptable for Xpert Xpress SARS-CoV-2/FLU/RSV testing.  Fact Sheet for Patients: BloggerCourse.com  Fact Sheet for Healthcare Providers: SeriousBroker.it  This test is not yet approved or cleared by the [place] FDA and has been authorized for detection and/or diagnosis of SARS-CoV-2 by FDA under an Emergency Use Authorization (EUA). This EUA will remain in effect (meaning this test can be used) for the duration of the COVID-19 declaration under Section 564(b)(1) of the Act, 21 U.S.C. section 360bbb-3(b)(1), unless the authorization is terminated or revoked.     Resp Syncytial Virus by PCR NEGATIVE NEGATIVE    Comment: (NOTE) Fact Sheet for Patients: BloggerCourse.com  Fact Sheet for Healthcare Providers: SeriousBroker.it  This test is not yet approved or cleared by the [place] FDA and has been authorized for detection and/or diagnosis of SARS-CoV-2 by FDA under an Emergency Use Authorization (EUA). This EUA will remain in effect (meaning this test can be used) for the duration of the COVID-19 declaration under Section 564(b)(1) of the Act, 21 U.S.C. section 360bbb-3(b)(1), unless the authorization is terminated or revoked.  Performed at [facility], [address] [place], [place] [address]   Urinalysis, Routine w reflex microscopic -Urine, Clean Catch     Status: Abnormal   Collection Time: 05/15/23  3:00 AM  Result Value Ref Range   Color, Urine YELLOW YELLOW   APPearance HAZY (A) CLEAR   Specific Gravity, Urine 1.018 1.005 - 1.030   pH 5.0 5.0 - 8.0   Glucose, UA NEGATIVE NEGATIVE mg/dL   Hgb urine dipstick NEGATIVE NEGATIVE   Bilirubin Urine NEGATIVE NEGATIVE   Ketones, ur NEGATIVE NEGATIVE mg/dL   Protein, ur NEGATIVE NEGATIVE mg/dL   Nitrite NEGATIVE NEGATIVE   Leukocytes,Ua NEGATIVE NEGATIVE    Comment:  Performed at [facility], [address], [place], [place] [address]  CBC     Status: Abnormal   Collection Time: 05/15/23  3:56 AM  Result Value Ref Range   WBC 17.6 (H) 4.0 - 10.5 K/uL   RBC 3.54 (L) 3.87 - 5.11 MIL/uL   Hemoglobin 11.3 (L) 12.0 - 15.0 g/dL   HCT 82.9 (L) 56.2 -  46.0 %   MCV 94.4 80.0 - 100.0 fL   MCH 31.9 26.0 - 34.0 pg   MCHC 33.8 30.0 - 36.0 g/dL   RDW 47.4 25.9 - 56.3 %   Platelets 341 150 - 400 K/uL   nRBC 0.0 0.0 - 0.2 %    Comment: Performed at [facility], [address], [place], [place] [address]  Basic metabolic panel     Status: Abnormal   Collection Time: 05/15/23  3:56 AM  Result Value Ref Range   Sodium 135 135 - 145 mmol/L   Potassium 4.1 3.5 - 5.1 mmol/L   Chloride 105 98 - 111 mmol/L   CO2 18 (L) 22 - 32 mmol/L   Glucose, Bld 103 (H) 70 - 99 mg/dL    Comment: Glucose reference range applies only to samples taken after fasting for at least 8 hours.   BUN 11 6 - 20 mg/dL   Creatinine, Ser 3.32 0.44 - 1.00 mg/dL   Calcium 8.6 (L) 8.9 - 10.3 mg/dL   GFR, Estimated >95 >18 mL/min    Comment: (NOTE) Calculated using the CKD-EPI Creatinine Equation (2021)    Anion gap 12 5 - 15    Comment: Performed at [facility], [address], [place], [place] [address]  Troponin I (High Sensitivity)     Status: None   Collection Time: 05/15/23  3:56 AM  Result Value Ref Range   Troponin I (High Sensitivity) 16 <18 ng/L    Comment: (NOTE) Elevated high sensitivity troponin I (hsTnI) values and significant  changes across serial measurements may suggest ACS but many other  chronic and acute conditions are known to elevate hsTnI results.  Refer to the "Links" section for chest pain algorithms and additional  guidance. Performed at [facility], [address], [place], [place] [address]   Urinalysis, Routine w reflex microscopic -Urine, Clean Catch     Status: Abnormal   Collection Time: 05/16/23  9:01 PM  Result Value Ref  Range   Color, Urine YELLOW YELLOW   APPearance HAZY (A) CLEAR   Specific Gravity, Urine 1.017 1.005 - 1.030   pH 5.0 5.0 - 8.0   Glucose, UA NEGATIVE NEGATIVE mg/dL   Hgb urine dipstick NEGATIVE NEGATIVE   Bilirubin Urine NEGATIVE NEGATIVE   Ketones, ur NEGATIVE NEGATIVE mg/dL   Protein, ur NEGATIVE NEGATIVE mg/dL   Nitrite NEGATIVE NEGATIVE   Leukocytes,Ua NEGATIVE NEGATIVE    Comment: Performed at [facility], [address] [place], [place] [address]  Protein / creatinine ratio, urine     Status: None   Collection Time: 05/16/23  9:01 PM  Result Value Ref Range   Creatinine, Urine 93 mg/dL   Total Protein, Urine 8 mg/dL    Comment: NO NORMAL RANGE ESTABLISHED FOR THIS TEST   Protein Creatinine Ratio 0.09 0.00 - 0.15 mg/mg[Cre]    Comment: Performed at [facility], [address], [place], [place] [address]  Troponin I (High Sensitivity)     Status: None   Collection Time: 05/16/23 10:04 PM  Result Value Ref Range   Troponin I (High Sensitivity) 11 <18 ng/L    Comment: (NOTE) Elevated high sensitivity troponin I (hsTnI) values and significant  changes across serial measurements may suggest ACS but many other  chronic and acute conditions are known to elevate hsTnI results.  Refer to the "Links" section for chest pain algorithms and additional  guidance. Performed at [facility], [address] [place], [place] [address]   CBC     Status: Abnormal   Collection Time: 05/16/23 10:04 PM  Result Value Ref Range   WBC 15.1 (H) 4.0 - 10.5 K/uL   RBC 3.66 (L) 3.87 - 5.11 MIL/uL   Hemoglobin 12.0 12.0 - 15.0 g/dL   HCT 84.1 (L) 32.4 - 40.1 %   MCV 95.1 80.0 - 100.0 fL   MCH 32.8 26.0 - 34.0 pg   MCHC 34.5 30.0 - 36.0 g/dL   RDW 02.7 25.3 - 66.4 %   Platelets 340 150 - 400 K/uL   nRBC 0.0 0.0 - 0.2 %    Comment: Performed at [facility], [address], [place], [place] [address]  Comprehensive metabolic panel     Status: Abnormal   Collection  Time: 05/16/23 10:04 PM  Result Value Ref Range   Sodium 137 135 - 145 mmol/L   Potassium 4.0 3.5 - 5.1 mmol/L   Chloride 107 98 - 111 mmol/L   CO2 21 (L) 22 - 32 mmol/L   Glucose, Bld 116 (H) 70 - 99 mg/dL    Comment: Glucose reference range applies only to samples taken after fasting for at least 8 hours.   BUN 10 6 - 20 mg/dL   Creatinine, Ser 4.25 0.44 - 1.00 mg/dL   Calcium 9.0 8.9 - 95.6 mg/dL   Total Protein 5.8 (L) 6.5 - 8.1 g/dL   Albumin 2.5 (L) 3.5 - 5.0 g/dL   AST 26 15 - 41 U/L   ALT 22 0 - 44 U/L   Alkaline Phosphatase 79 38 - 126 U/L   Total Bilirubin 0.4 0.3 - 1.2 mg/dL   GFR, Estimated >38 >75 mL/min    Comment: (NOTE) Calculated using the CKD-EPI Creatinine Equation (2021)    Anion gap 9 5 - 15    Comment: Performed at [facility], [address] [place], [place] [address]  Urinalysis, Routine w reflex microscopic -Urine, Clean Catch     Status: Abnormal   Collection Time: 05/23/23  6:08 AM  Result Value Ref Range   Color, Urine YELLOW YELLOW   APPearance HAZY (A) CLEAR   Specific Gravity, Urine 1.018 1.005 - 1.030   pH 5.0 5.0 - 8.0   Glucose, UA NEGATIVE NEGATIVE mg/dL   Hgb urine dipstick NEGATIVE NEGATIVE   Bilirubin Urine NEGATIVE NEGATIVE   Ketones, ur NEGATIVE NEGATIVE mg/dL   Protein, ur NEGATIVE NEGATIVE mg/dL   Nitrite NEGATIVE NEGATIVE   Leukocytes,Ua NEGATIVE NEGATIVE    Comment: Performed at [facility], [address], [place], [place] [address]  Protein / creatinine ratio, urine     Status: None   Collection Time: 05/23/23  6:08 AM  Result Value Ref Range   Creatinine, Urine 118 mg/dL   Total Protein, Urine 12 mg/dL    Comment: NO NORMAL RANGE ESTABLISHED FOR THIS TEST   Protein Creatinine Ratio 0.10 0.00 - 0.15 mg/mg[Cre]    Comment: Performed at [facility], [address], [place], [place] [address]  CBC     Status: Abnormal   Collection Time: 05/23/23  6:49 AM  Result Value Ref Range   WBC 15.1 (H) 4.0 - 10.5  K/uL   RBC 3.66 (L) 3.87 - 5.11 MIL/uL   Hemoglobin 12.0 12.0 - 15.0 g/dL   HCT 63.0 (L) 16.0 - 10.9 %   MCV 94.8 80.0 - 100.0 fL   MCH 32.8  26.0 - 34.0 pg   MCHC 34.6 30.0 - 36.0 g/dL   RDW 16.1 09.6 - 04.5 %   Platelets 317 150 - 400 K/uL   nRBC 0.1 0.0 - 0.2 %    Comment: Performed at [facility], [address] [place], [place] [address]  Comprehensive metabolic panel     Status: Abnormal   Collection Time: 05/23/23  6:49 AM  Result Value Ref Range   Sodium 134 (L) 135 - 145 mmol/L   Potassium 3.9 3.5 - 5.1 mmol/L   Chloride 106 98 - 111 mmol/L   CO2 18 (L) 22 - 32 mmol/L   Glucose, Bld 89 70 - 99 mg/dL    Comment: Glucose reference range applies only to samples taken after fasting for at least 8 hours.   BUN 9 6 - 20 mg/dL   Creatinine, Ser 1.91 0.44 - 1.00 mg/dL   Calcium 8.5 (L) 8.9 - 10.3 mg/dL   Total Protein 5.5 (L) 6.5 - 8.1 g/dL   Albumin 2.3 (L) 3.5 - 5.0 g/dL   AST 21 15 - 41 U/L   ALT 18 0 - 44 U/L   Alkaline Phosphatase 71 38 - 126 U/L   Total Bilirubin 0.2 (L) 0.3 - 1.2 mg/dL   GFR, Estimated >47 >82 mL/min    Comment: (NOTE) Calculated using the CKD-EPI Creatinine Equation (2021)    Anion gap 10 5 - 15    Comment: Performed at [facility], [address], [place], [place] [address]  Resp panel by RT-PCR (RSV, Flu A&B, Covid) Anterior Nasal Swab     Status: None   Collection Time: 06/04/23  6:26 AM   Specimen: Anterior Nasal Swab  Result Value Ref Range   SARS Coronavirus 2 by RT PCR NEGATIVE NEGATIVE   Influenza A by PCR NEGATIVE NEGATIVE   Influenza B by PCR NEGATIVE NEGATIVE    Comment: (NOTE) The Xpert Xpress SARS-CoV-2/FLU/RSV plus assay is intended as an aid in the diagnosis of influenza from Nasopharyngeal swab specimens and should not be used as a sole basis for treatment. Nasal washings and aspirates are unacceptable for Xpert Xpress SARS-CoV-2/FLU/RSV testing.  Fact Sheet for  Patients: BloggerCourse.com  Fact Sheet for Healthcare Providers: SeriousBroker.it  This test is not yet approved or cleared by the [place] FDA and has been authorized for detection and/or diagnosis of SARS-CoV-2 by FDA under an Emergency Use Authorization (EUA). This EUA will remain in effect (meaning this test can be used) for the duration of the COVID-19 declaration under Section 564(b)(1) of the Act, 21 U.S.C. section 360bbb-3(b)(1), unless the authorization is terminated or revoked.     Resp Syncytial Virus by PCR NEGATIVE NEGATIVE    Comment: (NOTE) Fact Sheet for Patients: BloggerCourse.com  Fact Sheet for Healthcare Providers: SeriousBroker.it  This test is not yet approved or cleared by the [place] FDA and has been authorized for detection and/or diagnosis of SARS-CoV-2 by FDA under an Emergency Use Authorization (EUA). This EUA will remain in effect (meaning this test can be used) for the duration of the COVID-19 declaration under Section 564(b)(1) of the Act, 21 U.S.C. section 360bbb-3(b)(1), unless the authorization is terminated or revoked.  Performed at [facility], [address] [place], [place] [address]   Urinalysis, Routine w reflex microscopic -Urine, Clean Catch     Status: Abnormal   Collection Time: 06/04/23  6:35 AM  Result Value Ref Range   Color, Urine YELLOW YELLOW  APPearance CLOUDY (A) CLEAR   Specific Gravity, Urine 1.011 1.005 - 1.030   pH 6.0 5.0 - 8.0   Glucose, UA NEGATIVE NEGATIVE mg/dL   Hgb urine dipstick NEGATIVE NEGATIVE   Bilirubin Urine NEGATIVE NEGATIVE   Ketones, ur NEGATIVE NEGATIVE mg/dL   Protein, ur NEGATIVE NEGATIVE mg/dL   Nitrite NEGATIVE NEGATIVE   Leukocytes,Ua MODERATE (A) NEGATIVE   RBC / HPF 0-5 0 - 5 RBC/hpf   WBC, UA 11-20 0 - 5 WBC/hpf   Bacteria, UA RARE (A) NONE SEEN   Squamous Epithelial /  HPF 21-50 0 - 5 /HPF   Mucus PRESENT     Comment: Performed at [facility], [address], [place], [place] [address]  Protein / creatinine ratio, urine     Status: Abnormal   Collection Time: 06/04/23  6:35 AM  Result Value Ref Range   Creatinine, Urine 57 mg/dL   Total Protein, Urine 15 mg/dL    Comment: NO NORMAL RANGE ESTABLISHED FOR THIS TEST   Protein Creatinine Ratio 0.26 (H) 0.00 - 0.15 mg/mg[Cre]    Comment: Performed at [facility], [address] [place], [place] [address]  CBC     Status: Abnormal   Collection Time: 06/04/23  6:51 AM  Result Value Ref Range   WBC 14.8 (H) 4.0 - 10.5 K/uL   RBC 3.58 (L) 3.87 - 5.11 MIL/uL   Hemoglobin 11.5 (L) 12.0 - 15.0 g/dL   HCT 57.8 (L) 46.9 - 62.9 %   MCV 94.1 80.0 - 100.0 fL   MCH 32.1 26.0 - 34.0 pg   MCHC 34.1 30.0 - 36.0 g/dL   RDW 52.8 41.3 - 24.4 %   Platelets 309 150 - 400 K/uL   nRBC 0.2 0.0 - 0.2 %    Comment: Performed at [facility], [address] [place], [place] [address]  Comprehensive metabolic panel     Status: Abnormal   Collection Time: 06/04/23  6:51 AM  Result Value Ref Range   Sodium 131 (L) 135 - 145 mmol/L   Potassium 3.8 3.5 - 5.1 mmol/L   Chloride 105 98 - 111 mmol/L   CO2 19 (L) 22 - 32 mmol/L   Glucose, Bld 94 70 - 99 mg/dL    Comment: Glucose reference range applies only to samples taken after fasting for at least 8 hours.   BUN 11 6 - 20 mg/dL   Creatinine, Ser 2.53 0.44 - 1.00 mg/dL   Calcium 8.4 (L) 8.9 - 10.3 mg/dL   Total Protein 5.4 (L) 6.5 - 8.1 g/dL   Albumin 2.2 (L) 3.5 - 5.0 g/dL   AST 23 15 - 41 U/L   ALT 18 0 - 44 U/L   Alkaline Phosphatase 79 38 - 126 U/L   Total Bilirubin 0.4 <1.2 mg/dL   GFR, Estimated >66 >44 mL/min    Comment: (NOTE) Calculated using the CKD-EPI Creatinine Equation (2021)    Anion gap 7 5 - 15    Comment: Performed at [facility], [address] [place], [place] [address]  Troponin I (High Sensitivity)     Status: Abnormal    Collection Time: 06/04/23  6:51 AM  Result Value Ref Range   Troponin I (High Sensitivity) 52 (H) <18 ng/L    Comment: (NOTE) Elevated high sensitivity troponin I (hsTnI) values and significant  changes across serial measurements may suggest ACS but many other  chronic and acute conditions are known to  elevate hsTnI results.  Refer to the "Links" section for chest pain algorithms and additional  guidance. Performed at [facility], [address], [place], [place] [address]   Brain natriuretic peptide     Status: None   Collection Time: 06/04/23  6:51 AM  Result Value Ref Range   B Natriuretic Peptide 52.4 0.0 - 100.0 pg/mL    Comment: Performed at [facility], [address], [place], [place] [address]  D-dimer, quantitative     Status: Abnormal   Collection Time: 06/04/23  7:36 AM  Result Value Ref Range   D-Dimer, Quant 1.97 (H) 0.00 - 0.50 ug/mL-FEU    Comment: (NOTE) At the manufacturer cut-off value of 0.5 g/mL FEU, this assay has a negative predictive value of 95-100%.This assay is intended for use in conjunction with a clinical pretest probability (PTP) assessment model to exclude pulmonary embolism (PE) and deep venous thrombosis (DVT) in outpatients suspected of PE or DVT. Results should be correlated with clinical presentation. Performed at [facility], [address], [place], [place] [address]   Type and screen [facility]     Status: None   Collection Time: 06/04/23  8:23 AM  Result Value Ref Range   ABO/RH(D) O POS    Antibody Screen NEG    Sample Expiration      06/07/2023,2359 Performed at [facility], [address], [place], [place] [address]   Troponin I (High Sensitivity)     Status: Abnormal   Collection Time: 06/04/23  8:28 AM  Result Value Ref Range   Troponin I (High Sensitivity) 58 (H) <18 ng/L    Comment: (NOTE) Elevated high sensitivity troponin I (hsTnI) values and significant  changes across  serial measurements may suggest ACS but many other  chronic and acute conditions are known to elevate hsTnI results.  Refer to the "Links" section for chest pain algorithms and additional  guidance. Performed at [facility], [address], [place], [place] [address]   Troponin I (High Sensitivity)     Status: Abnormal   Collection Time: 06/04/23 10:43 AM  Result Value Ref Range   Troponin I (High Sensitivity) 57 (H) <18 ng/L    Comment: (NOTE) Elevated high sensitivity troponin I (hsTnI) values and significant  changes across serial measurements may suggest ACS but many other  chronic and acute conditions are known to elevate hsTnI results.  Refer to the "Links" section for chest pain algorithms and additional  guidance. Performed at [facility], [address], [place], [place] [address]   ECHOCARDIOGRAM COMPLETE     Status: None   Collection Time: 06/05/23  9:40 AM  Result Value Ref Range   Weight 4,326.4 oz   Height 62 in   BP 154/93 mmHg   S' Lateral 2.40 cm   AR max vel 1.80 cm2   AV Area VTI 1.91 cm2   AV Mean grad 7.3 mmHg   AV Peak grad 13.8 mmHg   Ao pk vel 1.86 m/s   Area-P 1/2 3.91 cm2   MV M vel 4.37 m/s   AV Area mean vel 1.82 cm2   MV Peak grad 76.4 mmHg   Est EF 65 - 70%   Resp panel by RT-PCR (RSV, Flu A&B, Covid)     Status: None   Collection Time: 06/12/23  3:13 PM   Specimen: Nasal Swab  Result Value Ref Range   SARS Coronavirus 2  by RT PCR NEGATIVE NEGATIVE   Influenza A by PCR NEGATIVE NEGATIVE   Influenza B by PCR NEGATIVE NEGATIVE    Comment: (NOTE) The Xpert Xpress SARS-CoV-2/FLU/RSV plus assay is intended as an aid in the diagnosis of influenza from Nasopharyngeal swab specimens and should not be used as a sole basis for treatment. Nasal washings and aspirates are unacceptable for Xpert Xpress SARS-CoV-2/FLU/RSV testing.  Fact Sheet for Patients: BloggerCourse.com  Fact Sheet for Healthcare  Providers: SeriousBroker.it  This test is not yet approved or cleared by the [place] FDA and has been authorized for detection and/or diagnosis of SARS-CoV-2 by FDA under an Emergency Use Authorization (EUA). This EUA will remain in effect (meaning this test can be used) for the duration of the COVID-19 declaration under Section 564(b)(1) of the Act, 21 U.S.C. section 360bbb-3(b)(1), unless the authorization is terminated or revoked.     Resp Syncytial Virus by PCR NEGATIVE NEGATIVE    Comment: (NOTE) Fact Sheet for Patients: BloggerCourse.com  Fact Sheet for Healthcare Providers: SeriousBroker.it  This test is not yet approved or cleared by the [place] FDA and has been authorized for detection and/or diagnosis of SARS-CoV-2 by FDA under an Emergency Use Authorization (EUA). This EUA will remain in effect (meaning this test can be used) for the duration of the COVID-19 declaration under Section 564(b)(1) of the Act, 21 U.S.C. section 360bbb-3(b)(1), unless the authorization is terminated or revoked.  Performed at [facility], [address], [place], [place] [address]   Urinalysis, Routine w reflex microscopic -Urine, Clean Catch     Status: Abnormal   Collection Time: 06/12/23  3:35 PM  Result Value Ref Range   Color, Urine YELLOW YELLOW   APPearance HAZY (A) CLEAR   Specific Gravity, Urine 1.020 1.005 - 1.030   pH 6.0 5.0 - 8.0   Glucose, UA NEGATIVE NEGATIVE mg/dL   Hgb urine dipstick NEGATIVE NEGATIVE   Bilirubin Urine NEGATIVE NEGATIVE   Ketones, ur NEGATIVE NEGATIVE mg/dL   Protein, ur 119 (A) NEGATIVE mg/dL   Nitrite NEGATIVE NEGATIVE   Leukocytes,Ua NEGATIVE NEGATIVE   RBC / HPF 0-5 0 - 5 RBC/hpf   WBC, UA 0-5 0 - 5 WBC/hpf   Bacteria, UA RARE (A) NONE SEEN   Squamous Epithelial / HPF 11-20 0 - 5 /HPF   Mucus PRESENT     Comment: Performed at [facility],  [address], [place], [place] [address]  CBC     Status: Abnormal   Collection Time: 06/12/23  4:28 PM  Result Value Ref Range   WBC 15.4 (H) 4.0 - 10.5 K/uL   RBC 3.78 (L) 3.87 - 5.11 MIL/uL   Hemoglobin 12.3 12.0 - 15.0 g/dL   HCT 95.6 21.3 - 08.6 %   MCV 95.8 80.0 - 100.0 fL   MCH 32.5 26.0 - 34.0 pg   MCHC 34.0 30.0 - 36.0 g/dL   RDW 57.8 46.9 - 62.9 %   Platelets 323 150 - 400 K/uL   nRBC 0.1 0.0 - 0.2 %    Comment: Performed at [facility], [address] [place], [place] [address]  Comprehensive metabolic panel     Status: Abnormal   Collection Time: 06/12/23  4:28 PM  Result Value Ref Range   Sodium 134 (L) 135 - 145 mmol/L   Potassium 4.3 3.5 - 5.1 mmol/L   Chloride 105 98 - 111 mmol/L   CO2 19 (L) 22 - 32 mmol/L  Glucose, Bld 79 70 - 99 mg/dL    Comment: Glucose reference range applies only to samples taken after fasting for at least 8 hours.   BUN 13 6 - 20 mg/dL   Creatinine, Ser 4.09 0.44 - 1.00 mg/dL   Calcium 8.7 (L) 8.9 - 10.3 mg/dL   Total Protein 5.5 (L) 6.5 - 8.1 g/dL   Albumin 2.3 (L) 3.5 - 5.0 g/dL   AST 24 15 - 41 U/L   ALT 17 0 - 44 U/L   Alkaline Phosphatase 85 38 - 126 U/L   Total Bilirubin 0.5 <1.2 mg/dL   GFR, Estimated >81 >19 mL/min    Comment: (NOTE) Calculated using the CKD-EPI Creatinine Equation (2021)    Anion gap 10 5 - 15    Comment: Performed at [facility], [address] [place], [place] [address]  HIV Antibody (routine testing w rflx)     Status: None   Collection Time: 06/17/23 11:42 AM  Result Value Ref Range   HIV Screen 4th Generation wRfx Non Reactive Non Reactive    Comment: Performed at [facility], [address] [place], [place] [address]  Comprehensive metabolic panel     Status: Abnormal   Collection Time: 06/17/23 11:58 AM  Result Value Ref Range   Sodium 138 135 - 145 mmol/L   Potassium 4.0 3.5 - 5.1 mmol/L   Chloride 110 98 - 111 mmol/L   CO2 19 (L) 22 - 32 mmol/L   Glucose, Bld 102 (H) 70  - 99 mg/dL    Comment: Glucose reference range applies only to samples taken after fasting for at least 8 hours.   BUN 11 6 - 20 mg/dL   Creatinine, Ser 3.08 0.44 - 1.00 mg/dL   Calcium 8.7 (L) 8.9 - 10.3 mg/dL   Total Protein 5.1 (L) 6.5 - 8.1 g/dL   Albumin 2.1 (L) 3.5 - 5.0 g/dL   AST 22 15 - 41 U/L   ALT 16 0 - 44 U/L   Alkaline Phosphatase 82 38 - 126 U/L   Total Bilirubin 0.3 <1.2 mg/dL   GFR, Estimated >65 >78 mL/min    Comment: (NOTE) Calculated using the CKD-EPI Creatinine Equation (2021)    Anion gap 9 5 - 15    Comment: Performed at [facility], [address] [place], [place] [address]  CBC     Status: Abnormal   Collection Time: 06/17/23 12:06 PM  Result Value Ref Range   WBC 16.2 (H) 4.0 - 10.5 K/uL   RBC 3.43 (L) 3.87 - 5.11 MIL/uL   Hemoglobin 11.1 (L) 12.0 - 15.0 g/dL   HCT 95.2 (L) 84.1 - 32.4 %   MCV 93.3 80.0 - 100.0 fL   MCH 32.4 26.0 - 34.0 pg   MCHC 34.7 30.0 - 36.0 g/dL   RDW 40.1 02.7 - 25.3 %   Platelets 285 150 - 400 K/uL   nRBC 0.2 0.0 - 0.2 %    Comment: Performed at [facility], [address] [place], [place] [address]  Protein / creatinine ratio, urine     Status: Abnormal   Collection Time: 06/17/23 12:18 PM  Result Value Ref Range   Creatinine, Urine 191 mg/dL   Total Protein, Urine 885 mg/dL    Comment: RESULTS CONFIRMED BY MANUAL DILUTION NO NORMAL RANGE ESTABLISHED FOR THIS TEST    Protein Creatinine Ratio 4.63 (H) 0.00 - 0.15 mg/mg[Cre]    Comment: Performed at  [facility], [address] [place], [place], [place] [address]  Type and screen [facility]     Status: None   Collection Time: 06/17/23 12:18 PM  Result Value Ref Range   ABO/RH(D) O POS    Antibody Screen NEG    Sample Expiration      06/20/2023,2359 Performed at [facility], [address] [place] [address], [place], [place] [address]   RPR     Status: None   Collection Time: 06/17/23 12:18 PM  Result Value Ref Range   RPR Ser Ql NON REACTIVE  NON REACTIVE    Comment: Performed at [facility], [address] [place], [place] [address]  Culture, beta strep (group b only)     Status: Abnormal   Collection Time: 06/17/23  1:31 PM   Specimen: Vaginal/Rectal; Genital  Result Value Ref Range   Specimen Description VAGINAL/RECTAL    Special Requests NONE    Culture (A)     GROUP B STREP(S.AGALACTIAE)ISOLATED TESTING AGAINST S. AGALACTIAE NOT ROUTINELY PERFORMED DUE TO PREDICTABILITY OF AMP/PEN/VAN SUSCEPTIBILITY. Performed at [facility], [address], [place], [place] [address]    Report Status 06/19/2023 FINAL   Magnesium     Status: Abnormal   Collection Time: 06/17/23  9:43 PM  Result Value Ref Range   Magnesium 4.1 (H) 1.7 - 2.4 mg/dL    Comment: Performed at [facility], [address] [place], [place] [address]  CBC     Status: Abnormal   Collection Time: 06/18/23  7:48 AM  Result Value Ref Range   WBC 15.6 (H) 4.0 - 10.5 K/uL   RBC 3.79 (L) 3.87 - 5.11 MIL/uL   Hemoglobin 12.2 12.0 - 15.0 g/dL   HCT 20.2 (L) 54.2 - 70.6 %   MCV 93.4 80.0 - 100.0 fL   MCH 32.2 26.0 - 34.0 pg   MCHC 34.5 30.0 - 36.0 g/dL   RDW 23.7 62.8 - 31.5 %   Platelets 308 150 - 400 K/uL   nRBC 0.0 0.0 - 0.2 %    Comment: Performed at [facility], [address], [place], [place] [address]  Comprehensive metabolic panel     Status: Abnormal   Collection Time: 06/18/23  7:48 AM  Result Value Ref Range   Sodium 128 (L) 135 - 145 mmol/L    Comment: DELTA CHECK NOTED   Potassium 4.4 3.5 - 5.1 mmol/L   Chloride 101 98 - 111 mmol/L   CO2 19 (L) 22 - 32 mmol/L   Glucose, Bld 89 70 - 99 mg/dL    Comment: Glucose reference range applies only to samples taken after fasting for at least 8 hours.   BUN 9 6 - 20 mg/dL   Creatinine, Ser 0.73 0.44 - 1.00 mg/dL   Calcium 8.1 (L) 8.9 - 10.3 mg/dL   Total Protein 5.4 (L) 6.5 - 8.1 g/dL   Albumin 2.2 (L) 3.5 - 5.0 g/dL   AST 25 15 - 41 U/L   ALT 17 0 - 44 U/L   Alkaline  Phosphatase 79 38 - 126 U/L   Total Bilirubin 0.5 <1.2 mg/dL   GFR, Estimated >71 >06 mL/min    Comment: (NOTE) Calculated using the CKD-EPI Creatinine Equation (2021)    Anion gap 8 5 - 15    Comment: Performed at [facility], [address], [place], [place] [address]  Surgical pathology     Status: None  Collection Time: 06/18/23  9:16 AM  Result Value Ref Range   SURGICAL PATHOLOGY      SURGICAL PATHOLOGY CASE: [id number] PATIENT: Alexandra Escobar Surgical Pathology Report     Clinical History: Unscheduled cesarean section, [redacted]w[redacted]d (las)     FINAL MICROSCOPIC DIAGNOSIS:  A. PLACENTA, DELIVERY: -  Unremarkable three-vessel cord and membranes, negative for acute chorioamnionitis and funisitis. -  Placental disc with mature chorionic villi, otherwise no significant pathology.    GROSS DESCRIPTION:  A. Specimen received: fresh and subsequently placed in formalin labeled with the patient's name and DOB is a singleton placenta. Size and shape: ovoid, 21.9 x 18.0 x 2.3 cm Umbilical cord: centrally inserted, 39.8 x 0.9 cm, white-tan, and contains 3 vessels grossly. Membranes: red-tan, thin, and translucent. Marginally inserted. Weight: 374 g Fetal surface: blue-tan with characteristic vasculature. Maternal surface: intact with normal cotyledons. Cut surfaces: red-brown, spongy, and without a discrete mass or lesion to include hemorrhage or infar ct. Block summary: 1: proximal and distal cord and membrane roll 2: central disc near cord insertion 3: peripheral disc  (LEF 06/19/2023)   Final Diagnosis performed by Orene Desanctis DO.   Electronically signed 06/22/2023 Technical and / or Professional components performed at [facility], [address] [place], [place] [address].  Immunohistochemistry Technical component (if applicable) was performed at [facility] [address], [place], [place] [address].    IMMUNOHISTOCHEMISTRY DISCLAIMER (if applicable): Some of these immunohistochemical stains may have been developed and the performance characteristics determine by [facility]. Some may not have been cleared or approved by the U.S. Food and Drug Administration. The FDA has determined that such clearance or approval is not necessary. This test is used for clinical purposes. It should not be regarded as investigational or for research. This laborato ry is certified under the Clinical Laboratory Improvement Amendments of 1988 (CLIA-88) as qualified to perform high complexity clinical laboratory testing.  The controls stained appropriately.   IHC stains are performed on formalin fixed, paraffin embedded tissue using a 3,3"diaminobenzidine (DAB) chromogen and Leica Bond Autostainer System. The staining intensity of the nucleus is score manually and is reported as the percentage of tumor cell nuclei demonstrating specific nuclear staining. The specimens are fixed in 10% Neutral Formalin for at least 6 hours and up to 72hrs. These tests are validated on decalcified tissue. Results should be interpreted with caution given the possibility of false negative results on decalcified specimens. Antibody Clones are as follows ER-clone 16F, PR-clone 16, Ki67- clone MM1. Some of these immunohistochemical stains may have been developed and the performance characteristics determined by [facility].   ECHOCARDIOGRAM LIMITED     Status: None   Collection Time: 06/18/23 11:54 AM  Result Value Ref Range   Weight 4,547.2 oz   BP 137/88 mmHg   Est EF 60 - 65%   CBC     Status: Abnormal   Collection Time: 06/18/23  5:32 PM  Result Value Ref Range   WBC 19.2 (H) 4.0 - 10.5 K/uL   RBC 3.00 (L) 3.87 - 5.11 MIL/uL   Hemoglobin 9.8 (L) 12.0 - 15.0 g/dL   HCT 95.6 (L) 21.3 - 08.6 %   MCV 93.7 80.0 - 100.0 fL   MCH 32.7 26.0 - 34.0 pg   MCHC 34.9 30.0 - 36.0 g/dL   RDW 57.8 46.9 - 62.9 %    Platelets 287 150 - 400 K/uL   nRBC 0.0 0.0 - 0.2 %  Comment: Performed at [facility], [address] [place], [place] [address]  Comprehensive metabolic panel     Status: Abnormal   Collection Time: 06/18/23  5:32 PM  Result Value Ref Range   Sodium 131 (L) 135 - 145 mmol/L   Potassium 4.8 3.5 - 5.1 mmol/L   Chloride 104 98 - 111 mmol/L   CO2 18 (L) 22 - 32 mmol/L   Glucose, Bld 162 (H) 70 - 99 mg/dL    Comment: Glucose reference range applies only to samples taken after fasting for at least 8 hours.   BUN 11 6 - 20 mg/dL   Creatinine, Ser 3.47 0.44 - 1.00 mg/dL   Calcium 7.8 (L) 8.9 - 10.3 mg/dL   Total Protein 4.9 (L) 6.5 - 8.1 g/dL   Albumin 1.9 (L) 3.5 - 5.0 g/dL   AST 27 15 - 41 U/L   ALT 15 0 - 44 U/L   Alkaline Phosphatase 72 38 - 126 U/L   Total Bilirubin 0.2 <1.2 mg/dL   GFR, Estimated >42 >59 mL/min    Comment: (NOTE) Calculated using the CKD-EPI Creatinine Equation (2021)    Anion gap 9 5 - 15    Comment: Performed at [facility], [address], [place], [place] [address]  Magnesium     Status: Abnormal   Collection Time: 06/18/23  5:32 PM  Result Value Ref Range   Magnesium 4.3 (H) 1.7 - 2.4 mg/dL    Comment: Performed at [facility], [address], [place], [place] [address]  CBC     Status: Abnormal   Collection Time: 06/19/23  4:38 AM  Result Value Ref Range   WBC 15.6 (H) 4.0 - 10.5 K/uL   RBC 2.43 (L) 3.87 - 5.11 MIL/uL   Hemoglobin 7.9 (L) 12.0 - 15.0 g/dL   HCT 29.5 (L) 18.8 - 41.6 %   MCV 95.1 80.0 - 100.0 fL   MCH 32.5 26.0 - 34.0 pg   MCHC 34.2 30.0 - 36.0 g/dL   RDW 60.6 30.1 - 60.1 %   Platelets 230 150 - 400 K/uL   nRBC 0.0 0.0 - 0.2 %    Comment: Performed at [facility], [address], [place], [place] [address]  Comprehensive metabolic panel     Status: Abnormal   Collection Time: 06/19/23  4:38 AM  Result Value Ref Range   Sodium 130 (L) 135 - 145 mmol/L   Potassium 4.5 3.5 - 5.1 mmol/L   Chloride 103  98 - 111 mmol/L   CO2 21 (L) 22 - 32 mmol/L   Glucose, Bld 105 (H) 70 - 99 mg/dL    Comment: Glucose reference range applies only to samples taken after fasting for at least 8 hours.   BUN 13 6 - 20 mg/dL   Creatinine, Ser 5.57 0.44 - 1.00 mg/dL   Calcium 7.3 (L) 8.9 - 10.3 mg/dL   Total Protein 4.4 (L) 6.5 - 8.1 g/dL   Albumin 1.7 (L) 3.5 - 5.0 g/dL   AST 24 15 - 41 U/L   ALT 13 0 - 44 U/L   Alkaline Phosphatase 60 38 - 126 U/L   Total Bilirubin 0.2 <1.2 mg/dL   GFR, Estimated >32 >20 mL/min    Comment: (NOTE) Calculated using the CKD-EPI Creatinine Equation (2021)    Anion gap 6 5 - 15    Comment: Performed at [facility], [address] [place], [place] [address]  Magnesium     Status: Abnormal   Collection Time: 06/19/23  4:38 AM  Result Value Ref Range   Magnesium 3.9 (H) 1.7 - 2.4 mg/dL    Comment: Performed at [facility], [address], [place], [place] [address]  CBC     Status: Abnormal   Collection Time: 06/20/23  4:21 AM  Result Value Ref Range   WBC 15.9 (H) 4.0 - 10.5 K/uL   RBC 2.35 (L) 3.87 - 5.11 MIL/uL   Hemoglobin 7.6 (L) 12.0 - 15.0 g/dL   HCT 91.4 (L) 78.2 - 95.6 %   MCV 96.2 80.0 - 100.0 fL   MCH 32.3 26.0 - 34.0 pg   MCHC 33.6 30.0 - 36.0 g/dL   RDW 21.3 08.6 - 57.8 %   Platelets 270 150 - 400 K/uL   nRBC 0.4 (H) 0.0 - 0.2 %    Comment: Performed at [facility], [address] [place], [place] [address]  Comprehensive metabolic panel     Status: Abnormal   Collection Time: 06/20/23  4:21 AM  Result Value Ref Range   Sodium 138 135 - 145 mmol/L    Comment: DELTA CHECK NOTED   Potassium 4.2 3.5 - 5.1 mmol/L   Chloride 110 98 - 111 mmol/L   CO2 22 22 - 32 mmol/L   Glucose, Bld 91 70 - 99 mg/dL    Comment: Glucose reference range applies only to samples taken after fasting for at least 8 hours.   BUN 11 6 - 20 mg/dL   Creatinine, Ser 9.52 0.44 - 1.00 mg/dL   Calcium 8.3 (L) 8.9 - 10.3 mg/dL   Total Protein 4.8 (L) 6.5 - 8.1  g/dL   Albumin 1.9 (L) 3.5 - 5.0 g/dL   AST 23 15 - 41 U/L   ALT 14 0 - 44 U/L   Alkaline Phosphatase 63 38 - 126 U/L   Total Bilirubin 0.3 <1.2 mg/dL   GFR, Estimated >84 >13 mL/min    Comment: (NOTE) Calculated using the CKD-EPI Creatinine Equation (2021)    Anion gap 6 5 - 15    Comment: Performed at [facility], [address], [place], [place] [address]  CBC     Status: Abnormal   Collection Time: 06/22/23  5:15 AM  Result Value Ref Range   WBC 13.3 (H) 4.0 - 10.5 K/uL   RBC 2.29 (L) 3.87 - 5.11 MIL/uL   Hemoglobin 7.5 (L) 12.0 - 15.0 g/dL   HCT 02.7 (L) 25.3 - 66.4 %   MCV 98.3 80.0 - 100.0 fL   MCH 32.8 26.0 - 34.0 pg   MCHC 33.3 30.0 - 36.0 g/dL   RDW 40.3 47.4 - 25.9 %   Platelets 338 150 - 400 K/uL   nRBC 1.6 (H) 0.0 - 0.2 %    Comment: Performed at [facility], [address], [place], [place] [address]  Comprehensive metabolic panel     Status: Abnormal   Collection Time: 06/22/23  5:15 AM  Result Value Ref Range   Sodium 138 135 - 145 mmol/L   Potassium 3.8 3.5 - 5.1 mmol/L   Chloride 108 98 - 111 mmol/L   CO2 22 22 - 32 mmol/L   Glucose, Bld 88 70 - 99 mg/dL    Comment: Glucose reference range applies only to samples taken after fasting for at least 8 hours.   BUN 10 6 - 20 mg/dL   Creatinine, Ser  0.63 0.44 - 1.00 mg/dL   Calcium 8.2 (L) 8.9 - 10.3 mg/dL   Total Protein 4.9 (L) 6.5 - 8.1 g/dL   Albumin 2.0 (L) 3.5 - 5.0 g/dL   AST 35 15 - 41 U/L   ALT 21 0 - 44 U/L   Alkaline Phosphatase 72 38 - 126 U/L   Total Bilirubin 0.4 <1.2 mg/dL   GFR, Estimated >45 >40 mL/min    Comment: (NOTE) Calculated using the CKD-EPI Creatinine Equation (2021)    Anion gap 8 5 - 15    Comment: Performed at [facility], [address], [place], [place] [address]  CBC     Status: Abnormal   Collection Time: 06/23/23  6:21 AM  Result Value Ref Range   WBC 12.7 (H) 4.0 - 10.5 K/uL   RBC 2.21 (L) 3.87 - 5.11 MIL/uL   Hemoglobin 7.4 (L) 12.0 - 15.0 g/dL    HCT 14.7 (L) 82.9 - 46.0 %   MCV 100.0 80.0 - 100.0 fL   MCH 33.5 26.0 - 34.0 pg   MCHC 33.5 30.0 - 36.0 g/dL   RDW 56.2 13.0 - 86.5 %   Platelets 372 150 - 400 K/uL   nRBC 1.5 (H) 0.0 - 0.2 %    Comment: Performed at [facility], [address], [place], [place] [address]  CBC     Status: Abnormal   Collection Time: 06/24/23  8:38 AM  Result Value Ref Range   WBC 12.9 (H) 4.0 - 10.5 K/uL   RBC 2.29 (L) 3.87 - 5.11 MIL/uL   Hemoglobin 7.3 (L) 12.0 - 15.0 g/dL   HCT 62.9 (L) 52.8 - 41.3 %   MCV 99.6 80.0 - 100.0 fL   MCH 31.9 26.0 - 34.0 pg   MCHC 32.0 30.0 - 36.0 g/dL   RDW 24.4 (H) 01.0 - 27.2 %   Platelets 395 150 - 400 K/uL   nRBC 1.1 (H) 0.0 - 0.2 %    Comment: Performed at [facility], [address], [place], [place] [address]  Basic metabolic panel     Status: Abnormal   Collection Time: 06/24/23  8:38 AM  Result Value Ref Range   Sodium 138 135 - 145 mmol/L   Potassium 3.7 3.5 - 5.1 mmol/L   Chloride 105 98 - 111 mmol/L   CO2 23 22 - 32 mmol/L   Glucose, Bld 95 70 - 99 mg/dL    Comment: Glucose reference range applies only to samples taken after fasting for at least 8 hours.   BUN 17 6 - 20 mg/dL   Creatinine, Ser 4.03 (H) 0.44 - 1.00 mg/dL   Calcium 8.2 (L) 8.9 - 10.3 mg/dL   GFR, Estimated >47 >42 mL/min    Comment: (NOTE) Calculated using the CKD-EPI Creatinine Equation (2021)    Anion gap 10 5 - 15    Comment: Performed at [facility], [address] [place], [place] [address]  Type and screen [facility]     Status: None   Collection Time: 06/24/23 10:25 AM  Result Value Ref Range   ABO/RH(D) O POS    Antibody Screen NEG    Sample Expiration      06/27/2023,2359 Performed at [facility], [address], [place], [place] [address]   CBC     Status: Abnormal   Collection Time: 06/25/23  4:29 AM  Result Value Ref Range   WBC 14.2 (  H) 4.0 - 10.5 K/uL   RBC 2.24 (L) 3.87 - 5.11 MIL/uL   Hemoglobin 7.4 (L)  12.0 - 15.0 g/dL   HCT 29.5 (L) 62.1 - 30.8 %   MCV 100.0 80.0 - 100.0 fL   MCH 33.0 26.0 - 34.0 pg   MCHC 33.0 30.0 - 36.0 g/dL   RDW 65.7 (H) 84.6 - 96.2 %   Platelets 417 (H) 150 - 400 K/uL   nRBC 0.7 (H) 0.0 - 0.2 %    Comment: Performed at [facility], [address] [place], [place] [address]  Comprehensive metabolic panel     Status: Abnormal   Collection Time: 06/25/23  4:29 AM  Result Value Ref Range   Sodium 135 135 - 145 mmol/L   Potassium 4.1 3.5 - 5.1 mmol/L   Chloride 102 98 - 111 mmol/L   CO2 25 22 - 32 mmol/L   Glucose, Bld 107 (H) 70 - 99 mg/dL    Comment: Glucose reference range applies only to samples taken after fasting for at least 8 hours.   BUN 9 6 - 20 mg/dL   Creatinine, Ser 1.32 0.44 - 1.00 mg/dL   Calcium 7.9 (L) 8.9 - 10.3 mg/dL   Total Protein 5.3 (L) 6.5 - 8.1 g/dL   Albumin 2.2 (L) 3.5 - 5.0 g/dL   AST 30 15 - 41 U/L   ALT 21 0 - 44 U/L   Alkaline Phosphatase 80 38 - 126 U/L   Total Bilirubin 0.3 <1.2 mg/dL   GFR, Estimated >44 >01 mL/min    Comment: (NOTE) Calculated using the CKD-EPI Creatinine Equation (2021)    Anion gap 8 5 - 15    Comment: Performed at [facility], [address], [place], [place] [address]     A/P:  POD7 s/p pCS and POD1 s/p wound hematoma evacuation. Fascia was probed and closely inspected, intact. Benign exam this AM - abdomen soft, nontender, nondistended. Pressure dressing in place. Overnight, febrile to 100.7 - Started this AM on zosyn per pharmacy recommendations. No afebrile. UA/UCx pending. On her way to radiology for CT, I added on a CXR given new O2 requirement overnight, although per nursing suspect secondary to pain. Now satting normally. Lungs clear. cHTN with superimposed severe preeclampsia- s/p mag, CBC/CMP wnl. Continued on labetalol 200mg  TID. Cardiology following, appreciate continued recommendations. Hypothyroid - continue synthroid  Jule Economy, MD

## 2023-06-26 ENCOUNTER — Ambulatory Visit: Payer: BC Managed Care – PPO | Admitting: Cardiology

## 2023-06-26 LAB — CBC WITH DIFFERENTIAL/PLATELET
Abs Immature Granulocytes: 0.26 10*3/uL — ABNORMAL HIGH (ref 0.00–0.07)
Basophils Absolute: 0 10*3/uL (ref 0.0–0.1)
Basophils Relative: 0 %
Eosinophils Absolute: 0.3 10*3/uL (ref 0.0–0.5)
Eosinophils Relative: 3 %
HCT: 21.8 % — ABNORMAL LOW (ref 36.0–46.0)
Hemoglobin: 7.2 g/dL — ABNORMAL LOW (ref 12.0–15.0)
Immature Granulocytes: 2 %
Lymphocytes Relative: 21 %
Lymphs Abs: 2.4 10*3/uL (ref 0.7–4.0)
MCH: 32.4 pg (ref 26.0–34.0)
MCHC: 33 g/dL (ref 30.0–36.0)
MCV: 98.2 fL (ref 80.0–100.0)
Monocytes Absolute: 0.7 10*3/uL (ref 0.1–1.0)
Monocytes Relative: 6 %
Neutro Abs: 8 10*3/uL — ABNORMAL HIGH (ref 1.7–7.7)
Neutrophils Relative %: 68 %
Platelets: 429 10*3/uL — ABNORMAL HIGH (ref 150–400)
RBC: 2.22 MIL/uL — ABNORMAL LOW (ref 3.87–5.11)
RDW: 16.1 % — ABNORMAL HIGH (ref 11.5–15.5)
WBC: 11.8 10*3/uL — ABNORMAL HIGH (ref 4.0–10.5)
nRBC: 1 % — ABNORMAL HIGH (ref 0.0–0.2)

## 2023-06-26 MED ORDER — LABETALOL HCL 200 MG PO TABS
200.0000 mg | ORAL_TABLET | Freq: Two times a day (BID) | ORAL | Status: DC
  Administered 2023-06-26 – 2023-06-27 (×3): 200 mg via ORAL
  Filled 2023-06-26 (×3): qty 1

## 2023-06-26 MED ORDER — AMOXICILLIN-POT CLAVULANATE 875-125 MG PO TABS
1.0000 | ORAL_TABLET | Freq: Two times a day (BID) | ORAL | Status: DC
  Administered 2023-06-26 – 2023-06-27 (×3): 1 via ORAL
  Filled 2023-06-26 (×3): qty 1

## 2023-06-26 MED ORDER — DOCUSATE SODIUM 100 MG PO CAPS
100.0000 mg | ORAL_CAPSULE | Freq: Every day | ORAL | Status: DC
  Administered 2023-06-26: 100 mg via ORAL
  Filled 2023-06-26: qty 1

## 2023-06-26 NOTE — Progress Notes (Addendum)
Rounding Note    Patient Name: Alexandra Escobar Date of Encounter: 06/26/2023  [facility] Cardiologist: Thomasene Ripple, DO   Subjective   No acute events overnight. No further fevers. Continues to have excellent urine output. Blood pressure up today off labetalol. Hgb down only slightly to 7.2. She actually slept well last night for the first time. Hoping to go home tomorrow.  Inpatient Medications    Scheduled Meds:  acetaminophen  1,000 mg Oral Q6H   amoxicillin-clavulanate  1 tablet Oral Q12H   ibuprofen  600 mg Oral Q6H   labetalol  200 mg Oral BID   levothyroxine  50 mcg Oral QAC breakfast   mirtazapine  15 mg Oral QHS   polyethylene glycol  17 g Oral Daily   prenatal multivitamin  1 tablet Oral Q1200   senna-docusate  2 tablet Oral Daily   sertraline  150 mg Oral Daily   simethicone  80 mg Oral TID PC   torsemide  20 mg Oral Daily   Continuous Infusions:  sodium chloride     sodium chloride     PRN Meds: sodium chloride, albuterol, coconut oil, cyclobenzaprine, witch hazel-glycerin **AND** dibucaine, diphenhydrAMINE, diphenhydrAMINE, EPINEPHrine, HYDROmorphone (DILAUDID) injection, menthol-cetylpyridinium, methylPREDNISolone (SOLU-MEDROL) injection, oxyCODONE, simethicone, sodium chloride, zolpidem   Vital Signs    Vitals:   06/26/23 0405 06/26/23 0435 06/26/23 0705 06/26/23 0847  BP: 124/64   (!) 140/77  Pulse: 99   93  Resp: 16   18  Temp: 98.1 F (36.7 C)  97.7 F (36.5 C) 98 F (36.7 C)  TempSrc: Oral  Oral Oral  SpO2: 94%   96%  Weight:  115.8 kg    Height:        Intake/Output Summary (Last 24 hours) at 06/26/2023 1105 Last data filed at 06/25/2023 1730 Gross per 24 hour  Intake 0 ml  Output 1350 ml  Net -1350 ml      06/26/2023    4:35 AM 06/25/2023    5:00 AM 06/24/2023    1:22 PM  Last 3 Weights  Weight (lbs) 255 lb 6.4 oz 258 lb 12.8 oz 262 lb  Weight (kg) 115.849 kg 117.391 kg 118.842 kg      Telemetry    Not on  telemetry  Physical Exam   GEN: Well nourished, well developed in no acute distress NECK: No JVD CARDIAC: regular rhythm, normal S1 and S2, no rubs or gallops. No murmur. VASCULAR: Radial pulses 2+ bilaterally.  RESPIRATORY:  Clear to auscultation without rales, wheezing or rhonchi  ABDOMEN: Soft, non-tender, non-distended MUSCULOSKELETAL:  Moves all 4 limbs independently SKIN: Warm and dry, bilateral 1+ edema to calves but otherwise trace edema NEUROLOGIC:  No focal neuro deficits noted. PSYCHIATRIC:  Normal affect    New pertinent results (labs, ECG, imaging, cardiac studies)     Patient Profile     42 y.o. female with PMH hypertension, severe preeclampsia presented with DOE and weight gain, underwent c-section, now with persistent edema consistent with volume overload.  Assessment & Plan    Severe pre-eclampsia s/p c section 06/18/23 at 35 weeks Chronic hypertension -gained total of 114 lbs with pregnancy per notes. Weight on admission (before delivery) 128.9 kg. -weight today 115.8 kg, down 13.1 kg  -weight at her visit with Dr. Yetta Barre on 11/04/22 was 72.6 Kg. She is still >40 kg over her weight 8 months ago. Unclear how much of this is fluid. -I/O inaccurate but reports brisk urine output -limited echo 11/28 with  normal LVEF 60-65%, RAP 3 mmHg (IVC normal and collapses). RV normal.  -echo 06/05/23 with normal diastology -overall likely fluid retention from pregnancy now releasing. Not consistent with diastolic heart failure. Lungs are clear. -blood pressure much improved, labetalol stopped yesterday due to concern for infection, but BP rising today. Will restart labetalol at 200 mg BID -much improved urine output on torsemide compared to lasix, would continue torsemide at discharge -Hgb drifting down, from 12.2 preop to 7.2 today. Management per primary team -discussed compression stockings, ambulation, elevation of legs when sitting.  Overall she is stable from a cardiac  perspective. I have discussed with Dr. Servando Salina, she will have appt from today rescheduled to next week. Would discharge patient on torsemide 20 mg daily, instructed her to weigh herself and keep a log. Torsemide replaces her prior lasix. Restarted her prior to pregnancy dose of labetalol 200 mg BID today. Would not restart nifedipine at this time.   Cardiology will sign off, but please contact us with any new questions or concerns.    Signed, Jodelle Red, MD  06/26/2023,

## 2023-06-26 NOTE — Progress Notes (Signed)
Postpartum Progress Note  Postpartum Day 8 s/p primary Cesarean section, and POD#2 after wound hematoma evacuation.  Subjective:  Patient reports no overnight events. Her pain continues to improve and she was able to rest well. She reports well controlled pain, ambulating without difficulty, voiding spontaneously, tolerating PO.  She reports Positive flatus, Negative BM.  Vaginal bleeding is minimal.  Objective: Blood pressure 124/64, pulse 99, temperature 97.7 F (36.5 C), temperature source Oral, resp. rate 16, height 5\' 2"  (1.575 m), weight 115.8 kg, last menstrual period 10/11/2022, SpO2 94%, unknown if currently breastfeeding.  Physical Exam:  General: alert and no distress Lochia: appropriate Abdomen: soft, ATTP Uterine Fundus: firm Incision: dressing in place DVT Evaluation: No evidence of DVT seen on physical exam.  Recent Labs    06/25/23 0429 06/26/23 0430  HGB 7.4* 7.2*  HCT 22.4* 21.8*    Assessment/Plan: Postpartum Day 8, s/p C-section, POD#2 following evacuation of wound hematoma Isolated low grade fever of 100.35F 12/5 at 0548. Cefotan switched to Zosyn yesterday.  Given no obvious source of infection and >24 hrs without fever, will trial PO augmentin WBC improved 14.2 > 11.8 this AM CTAP 12/5 without apparent source of fever.  Findings reassuring.  UA reassuring and urine culture pending.  CXR 12/5 wnl cHTN - with superimposed preeclampsia, s/p magnesium.   Labetalol 200 mg TID discontinued for normotensive blood pressures Cardiology following, appreciate recommendations.  Continue torsemide. Limited echocardiogram 11/28 reassuring Required 1L [place] yesterday, likely due to pain.  Satting well on RA this AM.  Anemia - clinically significant acute blood loss anemia. S/p Fe infusion 12/05. No symptoms of anemia.  Hypothyroid - continue synthroid Pressure dressing in place, will remove and re-dress this afternoon to assess incision Patient is clinically improving.  She desires discharge as soon as cleared. Discussed recommendation for 24 hrs of PO antibiotics without fever or clinical changes. Discussed continued diuresis. If she continues to improve, may consider discharge home tomorrow with follow up in office early in the week.    LOS: 9 days   Lyn Henri 06/26/2023, 7:52 AM

## 2023-06-26 NOTE — Progress Notes (Signed)
At bedside to check on patient and incision  Pressure dressing removed without difficulty.    Existing bruising noted.  No purulent discharge but serosang drainage saturating half of the dressing is noted.  Skin edges well approximated with staples. Induration present.  Honeycomb dressing applied.   She continues to feel well today.  Will reassess in AM with labs.  Labetalol restarted with cardiology recs for uptrending BP.  Nilda Simmer

## 2023-06-26 NOTE — Lactation Note (Signed)
This note was copied from a baby's chart.  NICU Lactation Consultation Note  Patient Name: Alexandra Escobar Date: 06/26/2023 Age:40 days  Reason for consult: Follow-up assessment; Primapara; 1st time breastfeeding; NICU baby; Late-preterm 34-36.6wks; Maternal endocrine disorder; Infant < 6lbs; Other (Comment) (AMA, IVF) Type of Endocrine Disorder?: Diabetes; PCOS (hypothyroid (synthroid))  SUBJECTIVE Visited with family of 40 76/69 weeks old AGA NICU female; Alexandra Escobar is a P1 and reports her supply continues to dwindle, she feels a bit better than yesterday but not pumping every 3 hours yet due to recovering from her recent surgery. Baby "Alexandra Escobar" is getting discharged today. Reviewed discharge education and the importance of consistent pumping after feedings/attempts at the breast. She understands that any feedings at the breast will be just "extra" for baby, while we are still working on supply, and she'll need to get all her volumes through formula in the meantime after going home. She requested another NS # 16, referral to [facility] sent for [facility] OP F/U. Encouraged to try pumping every 3 hours to protect/increase her supply. No other support person at this time. All questions and concerns answered, family to contact [facility] services PRN.  OBJECTIVE Infant data: Mother's Current Feeding Choice: Breast Milk and Formula  O2 Device: Room Air  Infant feeding assessment Scale for Readiness: 1 Scale for Quality: 2   Maternal data: [id number] C-Section, Low Transverse Pumping frequency: 1 time/24 hours Pumped volume: 10 mL Flange Size: 18 Hands-free pumping top sizes: Small/Medium (Blue)  Pump: Hands Free, Personal, Refer for rental ([employer])  ASSESSMENT Infant: Feeding Status: Ad lib Feeding method: Bottle Nipple Type: Dr. Levert Feinstein Preemie  Maternal: Milk volume: Low  INTERVENTIONS/PLAN Interventions: Interventions: Breast feeding basics reviewed; DEBP; Education; Coconut  oil Discharge Education: Outpatient recommendation; Outpatient Epic message sent Tools: Pump; Flanges; Coconut oil; Hands-free pumping top Pump Education: Setup, frequency, and cleaning; Milk Storage  Plan: Consult Status: Complete NICU Follow-up type: Verify absence of engorgement; Verify onset of copious milk; Maternal D/C visit   Alexandra Escobar 06/26/2023, 3:35 PM

## 2023-06-27 LAB — CBC WITH DIFFERENTIAL/PLATELET
Abs Immature Granulocytes: 0.23 10*3/uL — ABNORMAL HIGH (ref 0.00–0.07)
Basophils Absolute: 0 10*3/uL (ref 0.0–0.1)
Basophils Relative: 0 %
Eosinophils Absolute: 0.3 10*3/uL (ref 0.0–0.5)
Eosinophils Relative: 3 %
HCT: 20.9 % — ABNORMAL LOW (ref 36.0–46.0)
Hemoglobin: 7 g/dL — ABNORMAL LOW (ref 12.0–15.0)
Immature Granulocytes: 2 %
Lymphocytes Relative: 31 %
Lymphs Abs: 3.8 10*3/uL (ref 0.7–4.0)
MCH: 33.5 pg (ref 26.0–34.0)
MCHC: 33.5 g/dL (ref 30.0–36.0)
MCV: 100 fL (ref 80.0–100.0)
Monocytes Absolute: 0.8 10*3/uL (ref 0.1–1.0)
Monocytes Relative: 6 %
Neutro Abs: 7.1 10*3/uL (ref 1.7–7.7)
Neutrophils Relative %: 58 %
Platelets: 452 10*3/uL — ABNORMAL HIGH (ref 150–400)
RBC: 2.09 MIL/uL — ABNORMAL LOW (ref 3.87–5.11)
RDW: 15.9 % — ABNORMAL HIGH (ref 11.5–15.5)
WBC: 12.2 10*3/uL — ABNORMAL HIGH (ref 4.0–10.5)
nRBC: 0.9 % — ABNORMAL HIGH (ref 0.0–0.2)

## 2023-06-27 LAB — PREPARE RBC (CROSSMATCH)

## 2023-06-27 MED ORDER — DIPHENHYDRAMINE HCL 25 MG PO CAPS
25.0000 mg | ORAL_CAPSULE | Freq: Once | ORAL | Status: AC
  Administered 2023-06-27: 25 mg via ORAL
  Filled 2023-06-27: qty 1

## 2023-06-27 MED ORDER — LABETALOL HCL 200 MG PO TABS: 200.0000 mg | ORAL_TABLET | Freq: Two times a day (BID) | ORAL | 0 refills | Status: DC

## 2023-06-27 MED ORDER — SODIUM CHLORIDE 0.9% IV SOLUTION: Freq: Once | INTRAVENOUS | Status: AC

## 2023-06-27 MED ORDER — IBUPROFEN 600 MG PO TABS: 600.0000 mg | ORAL_TABLET | Freq: Four times a day (QID) | ORAL | 0 refills | Status: DC

## 2023-06-27 MED ORDER — OXYCODONE HCL 5 MG PO TABS: 5.0000 mg | ORAL_TABLET | ORAL | 0 refills | Status: DC | PRN

## 2023-06-27 MED ORDER — AMOXICILLIN-POT CLAVULANATE 875-125 MG PO TABS: 1.0000 | ORAL_TABLET | Freq: Two times a day (BID) | ORAL | 0 refills | Status: AC

## 2023-06-27 MED ORDER — ACETAMINOPHEN 325 MG PO TABS: 650.0000 mg | ORAL_TABLET | Freq: Four times a day (QID) | ORAL | 0 refills | Status: DC | PRN

## 2023-06-27 MED ORDER — TORSEMIDE 20 MG PO TABS: 20.0000 mg | ORAL_TABLET | Freq: Every day | ORAL | 0 refills | Status: DC

## 2023-06-27 MED ORDER — DOCUSATE SODIUM 100 MG PO CAPS: 100.0000 mg | ORAL_CAPSULE | Freq: Every day | ORAL | 0 refills | Status: DC

## 2023-06-27 NOTE — Plan of Care (Signed)
  Problem: Education: Goal: Knowledge of disease or condition will improve Outcome: Adequate for Discharge Goal: Knowledge of the prescribed therapeutic regimen will improve Outcome: Adequate for Discharge   Problem: Fluid Volume: Goal: Peripheral tissue perfusion will improve Outcome: Adequate for Discharge   Problem: Clinical Measurements: Goal: Complications related to disease process, condition or treatment will be avoided or minimized Outcome: Adequate for Discharge   Problem: Health Behavior/Discharge Planning: Goal: Ability to manage health-related needs will improve Outcome: Adequate for Discharge   Problem: Clinical Measurements: Goal: Ability to maintain clinical measurements within normal limits will improve Outcome: Adequate for Discharge Goal: Will remain free from infection Outcome: Adequate for Discharge Goal: Diagnostic test results will improve Outcome: Adequate for Discharge   Problem: Elimination: Goal: Will not experience complications related to bowel motility Outcome: Adequate for Discharge   Problem: Pain Management: Goal: General experience of comfort will improve Outcome: Adequate for Discharge   Problem: Safety: Goal: Ability to remain free from injury will improve Outcome: Adequate for Discharge   Problem: Skin Integrity: Goal: Risk for impaired skin integrity will decrease Outcome: Adequate for Discharge   Problem: Education: Goal: Knowledge of the prescribed therapeutic regimen will improve Outcome: Adequate for Discharge Goal: Understanding of sexual limitations or changes related to disease process or condition will improve Outcome: Adequate for Discharge   Problem: Self-Concept: Goal: Communication of feelings regarding changes in body function or appearance will improve Outcome: Adequate for Discharge   Problem: Skin Integrity: Goal: Demonstration of wound healing without infection will improve Outcome: Adequate for Discharge    Problem: Education: Goal: Knowledge of condition will improve Outcome: Adequate for Discharge   Problem: Activity: Goal: Will verbalize the importance of balancing activity with adequate rest periods Outcome: Adequate for Discharge Goal: Ability to tolerate increased activity will improve Outcome: Adequate for Discharge   Problem: Coping: Goal: Ability to identify and utilize available resources and services will improve Outcome: Adequate for Discharge   Problem: Life Cycle: Goal: Chance of risk for complications during the postpartum period will decrease Outcome: Adequate for Discharge   Problem: Role Relationship: Goal: Ability to demonstrate positive interaction with newborn will improve Outcome: Adequate for Discharge   Problem: Skin Integrity: Goal: Demonstration of wound healing without infection will improve Outcome: Adequate for Discharge

## 2023-06-27 NOTE — Progress Notes (Signed)
Postpartum Progress Note  Postpartum Day 9 s/p primary Cesarean section, and POD#3 after wound hematoma evacuation.  Subjective:  Patient reports no overnight events. Her pain continues to improve and she was able to rest well. She reports well controlled pain, ambulating without difficulty, voiding spontaneously, tolerating PO.  She reports Positive flatus, Negative BM.  Vaginal bleeding is minimal.  Objective: Blood pressure 126/78, pulse 98, temperature 98.2 F (36.8 C), temperature source Oral, resp. rate 18, height 5\' 2"  (1.575 m), weight 114.9 kg, last menstrual period 10/11/2022, SpO2 96%, unknown if currently breastfeeding.  Physical Exam:  General: alert and no distress Lochia: appropriate Abdomen: soft, ATTP Uterine Fundus: firm Incision: dressing in place, small amount of shadowing present DVT Evaluation: No evidence of DVT seen on physical exam.  Recent Labs    06/26/23 0430 06/27/23 0453  HGB 7.2* 7.0*  HCT 21.8* 20.9*    Assessment/Plan: Postpartum Day 9, s/p C-section, POD#3 following evacuation of wound hematoma Isolated low grade fever of 100.43F 12/5 at 0548. Afebrile for 48 hrs.  Currently on PO Augmentin since 12/6 AM WBC improved 14.2 > 11.8 > 12.2 this AM CTAP 12/5 without apparent source of fever.  Findings reassuring.  UA reassuring and urine culture pending.  CXR 12/5 wnl cHTN - with superimposed preeclampsia, s/p magnesium.   Labetalol 200 mg TID Cardiology following, appreciate recommendations, now signed off.  Continue torsemide. Limited echocardiogram 11/28 reassuring Anemia - clinically significant acute blood loss anemia. S/p Fe infusion 12/05. No symptoms of anemia.  Hypothyroid - continue synthroid Dispo: Patient is clinically improving. She desires discharge as soon as cleared. Discussed recommendation for 24 hrs of PO antibiotics without fever or clinical changes. Discussed continued diuresis.  Will plan for in-office follow up Mon or  Tues.   LOS: 10 days   Lyn Henri 06/27/2023, 7:38 AM

## 2023-06-27 NOTE — Discharge Summary (Signed)
Obstetric Discharge Summary  Alexandra Escobar is a 42 y.o. female that presented on 06/17/2023 for chronic hypertension with superimposed preeclampsia with severe features at [redacted]w[redacted]d.  Pregnancy course also notable for AMA, hypothyroidism, IVF pregnancy.  She was followed by cardiology.   She underwent induction of labor for severe preeclampsia, including headache, vision changes, severe range blood pressures.  She had a total weight gain of 114 lbs since initial prenatal visit. Throughout third trimester, she has also experienced intermittent chest pain and SOB. She was admitted 2 weeks ago and underwent CT, echocardiogram, and cardiology consultation.   She was admitted to labor and delivery for induction. She underwent primary C section for failed induction and delivered a viable female infant on 06/18/2023.  Cardiology was consulted for postpartum management of blood pressure and diuresis.  On POD#3 she was noted to have incisional drainage and on POD#4 a wound hematoma was noted and drained with bedside pressure.  Packing was placed. She underwent packing changes but determined that she would benefit from opening of the incision, removal of hematoma.  On 06/24/2023 this was completed and the wound was closed.  On 06/25/2023, a low grade fever was noted and empiric IV antibiotics were begun.  CTAP was reassuring, as was CXR.  Her condition continued to improve and after 24 hours without a fever, she was transitioned to oral augmentin.  Dressing changes were done in order to continue wound assessment. She was requiring no narcotic medication and feeling well.  On 12/7, she desired discharge home. She was notably anemic but without symptoms. She received venofer days prior, but with HGB 7.0 and possibility of recurrence of wound hematoma, a unit of pRBC was transfused.   She reported well controlled pain, spontaneous voiding, ambulating without difficulty, and tolerating PO.  She was stable for discharge home  on 06/27/2023 with plans for in-office follow up early in the week.   Hemoglobin  Date Value Ref Range Status  06/27/2023 7.0 (L) 12.0 - 15.0 g/dL Final   HCT  Date Value Ref Range Status  06/27/2023 20.9 (L) 36.0 - 46.0 % Final    Physical Exam:  General: alert and no distress Lochia: appropriate Uterine Fundus: firm Incision: Skin edges intact, staples in place, some shadowing on dressing DVT Evaluation: No evidence of DVT seen on physical exam.  Discharge Diagnoses: cHTN with superimposed preeclampsia with severe features, wound hematoma, 35 week delivery via C section  Discharge Information: Date: 06/27/2023 Activity: Pelvic rest, as tolerated Diet: routine Medications: Tylenol, motrin, oxycodone, augmentin, torsemide Condition: stable Instructions: Refer to practice specific booklet.  Discussed prior to discharge.  Discharge to: Home  Follow-up Information     [place] Follow up.   Why: Please follow up for an incision and blood pressure check, the office will reach out to schedule this for a Monday or Tuesday appointment. Contact information: [address] [place] [address] [phone]                 Newborn Data: Live born female  Birth Weight: 4 lb 15 oz (2240 g) APGAR: 4, 9  Newborn Delivery   Birth date/time: 06/18/2023 09:14:00 Delivery type: C-Section, Low Transverse Trial of labor: Yes C-section categorization: Primary     Home with mother.  Lyn Henri 06/27/2023, 4:30 PM

## 2023-06-28 LAB — TYPE AND SCREEN
ABO/RH(D): O POS
Antibody Screen: NEGATIVE
Unit division: 0

## 2023-06-28 LAB — BPAM RBC
Blood Product Expiration Date: 202412182359
ISSUE DATE / TIME: 202412071335
Unit Type and Rh: 5100

## 2023-06-28 LAB — CULTURE, OB URINE: Culture: NO GROWTH

## 2023-06-29 ENCOUNTER — Telehealth (HOSPITAL_COMMUNITY): Payer: Self-pay | Admitting: *Deleted

## 2023-06-29 ENCOUNTER — Telehealth: Payer: Self-pay

## 2023-06-29 NOTE — Telephone Encounter (Signed)
Called pt to set up appt form Friday with Dr. Servando Salina. No answer, left message for pt to return call.

## 2023-06-29 NOTE — Telephone Encounter (Signed)
Attempted hospital discharge follow-up call. Left message for patient to return RN call with any questions or concerns. Deforest Hoyles, RN, 06/29/23, [phone]

## 2023-06-29 NOTE — Telephone Encounter (Signed)
Hospital inpatient EPDS=14. Patient answered "never" to question #10. CSW consult completed during stay. EPDS results faxed to Dr. Lorane Gell for review. Paulene Floor, RN, 06/29/23, [phone]

## 2023-06-30 ENCOUNTER — Telehealth: Payer: Self-pay | Admitting: *Deleted

## 2023-06-30 NOTE — Progress Notes (Unsigned)
  Care Coordination  Outreach Note  06/30/2023 Name: Alexandra Escobar MRN: [id number] DOB: 13-Nov-1980   Care Coordination Outreach Attempts: An unsuccessful telephone outreach was attempted today to offer the patient information about available care coordination services.  Follow Up Plan:  Additional outreach attempts will be made to offer the patient care coordination information and services.   Encounter Outcome:  No Answer  Burman Nieves, CCMA Care Coordination Care Guide Direct Dial: [phone]

## 2023-07-01 ENCOUNTER — Telehealth: Payer: Self-pay

## 2023-07-01 ENCOUNTER — Inpatient Hospital Stay (HOSPITAL_COMMUNITY)
Admission: RE | Admit: 2023-07-01 | Payer: BC Managed Care – PPO | Source: Home / Self Care | Admitting: Obstetrics and Gynecology

## 2023-07-01 ENCOUNTER — Inpatient Hospital Stay (HOSPITAL_COMMUNITY): Payer: BC Managed Care – PPO

## 2023-07-01 NOTE — Telephone Encounter (Signed)
Called pt to set up appointment either Friday or next week. No answer, left message for her to return call. Will send a MyChart message as well.

## 2023-07-01 NOTE — Progress Notes (Unsigned)
  Care Coordination  Outreach Note  07/01/2023 Name: Alexandra Escobar MRN: [id number] DOB: 04-28-1981   Care Coordination Outreach Attempts: A second unsuccessful outreach was attempted today to offer the patient with information about available care coordination services.  Follow Up Plan:  Additional outreach attempts will be made to offer the patient care coordination information and services.   Encounter Outcome:  No Answer  Burman Nieves, CCMA Care Coordination Care Guide Direct Dial: [phone]

## 2023-07-02 NOTE — Progress Notes (Signed)
  Care Coordination  Outreach Note  07/02/2023 Name: Alexandra Escobar MRN: [id number] DOB: 04/10/1981   Care Coordination Outreach Attempts: A third unsuccessful outreach was attempted today to offer the patient with information about available care coordination services.  Follow Up Plan:  No further outreach attempts will be made at this time. We have been unable to contact the patient to offer or enroll patient in care coordination services  Encounter Outcome:  No Answer  Burman Nieves, [facility] Care Coordination Care Guide Direct Dial: [phone]

## 2023-07-03 ENCOUNTER — Ambulatory Visit (INDEPENDENT_AMBULATORY_CARE_PROVIDER_SITE_OTHER): Payer: Managed Care, Other (non HMO) | Admitting: Cardiology

## 2023-07-03 ENCOUNTER — Encounter: Payer: Self-pay | Admitting: Cardiology

## 2023-07-03 VITALS — BP 142/94 | HR 99 | Ht 62.0 in | Wt 234.8 lb

## 2023-07-03 DIAGNOSIS — R0789 Other chest pain: Secondary | ICD-10-CM

## 2023-07-03 DIAGNOSIS — Z3A35 35 weeks gestation of pregnancy: Secondary | ICD-10-CM

## 2023-07-03 DIAGNOSIS — I5083 High output heart failure: Secondary | ICD-10-CM

## 2023-07-03 DIAGNOSIS — E669 Obesity, unspecified: Secondary | ICD-10-CM

## 2023-07-03 DIAGNOSIS — O165 Unspecified maternal hypertension, complicating the puerperium: Secondary | ICD-10-CM | POA: Diagnosis not present

## 2023-07-03 NOTE — Patient Instructions (Signed)
Medication Instructions:  Your physician recommends that you continue on your current medications as directed. Please refer to the Current Medication list given to you today.  *If you need a refill on your cardiac medications before your next appointment, please call your pharmacy*   Follow-Up: At [facility], you and your health needs are our priority.  As part of our continuing mission to provide you with exceptional heart care, we have created designated Provider Care Teams.  These Care Teams include your primary Cardiologist (physician) and Advanced Practice Providers (APPs -  Physician Assistants and Nurse Practitioners) who all work together to provide you with the care you need, when you need it.   Your next appointment:   8 week(s)  Provider:   Thomasene Ripple, DO [address], [place], [place] [address]

## 2023-07-05 NOTE — Progress Notes (Signed)
Cardio-Obstetrics Clinic  Follow Up Note   Date:  07/05/2023   ID:  [name], [name] 23-Nov-1980, MRN [id number]  PCP:  Etta Grandchild, MD   [facility] Providers Cardiologist:  Thomasene Ripple, DO  Electrophysiologist:  None        Referring MD: Etta Grandchild, MD   Chief Complaint: " I am ok"   History of Present Illness:    Alexandra Escobar is a 42 y.o. female [G3P0121] who returns for follow up of  chronic hypertension with superimposed preeclampsia with severe features at 109w4d., underwent induction due to preeclampsia and status post c-section, hypothyroidism, IVF pregnancy.    During her postpartum stay she went into heart failure exacerbation which I suspect was high output heart failure due to anemia. She was diuresed.   Her hospital stay postpartum was also complicated on POD #3 by incisional drainage and on POD#4 a wound hematoma was noted and drained with bedside pressure.  Packing was placed. She underwent packing changes but determined that she would benefit from opening of the incision, removal of hematoma.  On 06/24/2023 this was completed and the wound was closed.   On 06/25/2023, a low grade fever was noted and empiric IV antibiotics were begun.  CTAP was reassuring, as was CXR.  Her condition continued to improve and after 24 hours without a fever, she was transitioned to oral augmentin.  Dressing changes were done in order to continue wound assessment.   She was discharged to home after being placed on oral Abx and oral diuretics.    She is presents with chest pain and fluctuating blood pressure. The chest pain is described as pressure on the left side, which has been intermittent and not continuous. The patient has noticed blood pressure spikes, with readings as low as 91/77 and higher readings leading to early administration of blood pressure medication. The patient has been feeling overwhelmed and has had difficulty keeping up with blood pressure  monitoring.  The patient also reports a significant reduction in swelling, having lost fifty pounds of water weight. She is currently breastfeeding, though production is low due to Lasix medication. The patient hopes to boost milk production once the Lasix is stopped.  In addition to these issues, the patient has been experiencing pain when taking deep breaths, which started again on the day of the appointment. This pain is exacerbated when the patient's baby is placed on her chest. The patient has not been taking ibuprofen for this pain.   Prior CV Studies Reviewed: The following studies were reviewed today: Echo   Past Medical History:  Diagnosis Date   Anxiety    Depression    Endometriosis    Fatty liver    Hypertension    Hypothyroidism    PCOS (polycystic ovarian syndrome)     Past Surgical History:  Procedure Laterality Date   CESAREAN SECTION N/A 06/18/2023   Procedure: CESAREAN SECTION;  Surgeon: Harold Hedge, MD;  Location: [facility] LD ORS;  Service: Obstetrics;  Laterality: N/A;   DILATION AND CURETTAGE OF UTERUS  06/2021   HEMATOMA EVACUATION N/A 06/24/2023   Procedure: EVACUATION HEMATOMA INCISIONAL;  Surgeon: Ranae Pila, MD;  Location: [facility] OR;  Service: Gynecology;  Laterality: N/A;   LAPAROSCOPY     for endometriosis   NASAL SEPTUM SURGERY     OTHER SURGICAL HISTORY     fibroid removal   OTHER SURGICAL HISTORY     fistula drainage      OB History  Gravida  3   Para  1   Term      Preterm  1   AB  2   Living  1      SAB  2   IAB      Ectopic      Multiple  0   Live Births  1               Current Medications: Current Meds  Medication Sig   labetalol (NORMODYNE) 200 MG tablet Take 1 tablet (200 mg total) by mouth 2 (two) times daily.   levothyroxine (SYNTHROID) 50 MCG tablet Take 1 tablet (50 mcg total) by mouth daily before breakfast.   mirtazapine (REMERON) 15 MG tablet Take 15 mg by mouth at bedtime.   sertraline  (ZOLOFT) 100 MG tablet Take 150 mg by mouth daily.   torsemide (DEMADEX) 20 MG tablet Take 1 tablet (20 mg total) by mouth daily for 7 days.     Allergies:   Patient has no known allergies.   Social History   Socioeconomic History   Marital status: Married    Spouse name: Not on file   Number of children: Not on file   Years of education: Not on file   Highest education level: Some college, no degree  Occupational History   Not on file  Tobacco Use   Smoking status: Never    Passive exposure: Never   Smokeless tobacco: Never  Vaping Use   Vaping status: Never Used  Substance and Sexual Activity   Alcohol use: Never   Drug use: Never   Sexual activity: Not Currently    Partners: Male    Birth control/protection: None  Other Topics Concern   Not on file  Social History Narrative   Not on file   Social Drivers of Health   Financial Resource Strain: Low Risk  (04/30/2023)   Overall Financial Resource Strain (CARDIA)    Difficulty of Paying Living Expenses: Not very hard  Food Insecurity: No Food Insecurity (06/17/2023)   Hunger Vital Sign    Worried About Running Out of Food in the Last Year: Never true    Ran Out of Food in the Last Year: Never true  Transportation Needs: No Transportation Needs (06/17/2023)   PRAPARE - Administrator, Civil Service (Medical): No    Lack of Transportation (Non-Medical): No  Physical Activity: Unknown (04/30/2023)   Exercise Vital Sign    Days of Exercise per Week: 0 days    Minutes of Exercise per Session: Not on file  Stress: No Stress Concern Present (04/30/2023)   [employer] of Occupational Health - Occupational Stress Questionnaire    Feeling of Stress : Not at all  Social Connections: Moderately Integrated (04/30/2023)   Social Connection and Isolation Panel [NHANES]    Frequency of Communication with Friends and Family: More than three times a week    Frequency of Social Gatherings with Friends and Family:  Twice a week    Attends Religious Services: 1 to 4 times per year    Active Member of [employer] or Organizations: No    Attends Engineer, structural: Not on file    Marital Status: Married      Family History  Problem Relation Age of Onset   Hypertension Father    Depression Father    Hypertension Sister    Cancer Maternal Grandmother        breast   Hypertension Paternal Grandmother  ROS:   Please see the history of present illness.     All other systems reviewed and are negative.   Labs/EKG Reviewed:    EKG:   EKG was no  ordered today.    Recent Labs: 11/04/2022: TSH 1.11 06/04/2023: B Natriuretic Peptide 52.4 06/19/2023: Magnesium 3.9 06/25/2023: ALT 21; BUN 9; Creatinine, Ser 0.84; Potassium 4.1; Sodium 135 06/27/2023: Hemoglobin 7.0; Platelets 452   Recent Lipid Panel No results found for: "CHOL", "TRIG", "HDL", "CHOLHDL", "LDLCALC", "LDLDIRECT"  Physical Exam:    VS:  BP (!) 142/94 (BP Location: Right Arm, Patient Position: Sitting, Cuff Size: Normal)   Pulse 99   Ht 5\' 2"  (1.575 m)   Wt 234 lb 12.8 oz (106.5 kg)   LMP 10/11/2022 (Approximate)   SpO2 (!) 86%   BMI 42.95 kg/m     Wt Readings from Last 3 Encounters:  07/03/23 234 lb 12.8 oz (106.5 kg)  06/27/23 253 lb 4.9 oz (114.9 kg)  06/16/23 284 lb (128.8 kg)     GEN:  Well nourished, well developed in no acute distress HEENT: Normal NECK: No JVD; No carotid bruits LYMPHATICS: No lymphadenopathy CARDIAC: RRR, no murmurs, rubs, gallops RESPIRATORY:  Clear to auscultation without rales, wheezing or rhonchi  ABDOMEN: Soft, non-tender, non-distended MUSCULOSKELETAL:  No edema; No deformity  SKIN: Warm and dry NEUROLOGIC:  Alert and oriented x 3 PSYCHIATRIC:  Normal affect    Risk Assessment/Risk Calculators:                 ASSESSMENT & PLAN:     Chest Pain Recurrent left-sided chest pain, possibly related to anxiety. Previous EKGs normal. -Plan for coronary CTA once  breastfeeding is completed to assess coronary anatomy. -Take ibuprofen 600mg  as needed for chest pain.  Postpartum Edema Significant improvement with 50lbs of water weight loss. Suspect this was high output heart failure in the setting of anemia postpartum.  -Continue Lasix as prescribed.  Breastfeeding Currently struggling with milk production, possibly due to Lasix. -Continue breastfeeding and stimulation as tolerated. -Expect improvement in milk production once Lasix is discontinued.  Anemia Recent blood transfusions due to low hemoglobin. Current hemoglobin improved to 9. -No further action needed at this time.  Follow-up in 2-3 weeks with Thayer Ohm and in 8 weeks with the doctor.  Patient Instructions  Medication Instructions:  Your physician recommends that you continue on your current medications as directed. Please refer to the Current Medication list given to you today.  *If you need a refill on your cardiac medications before your next appointment, please call your pharmacy*   Follow-Up: At [facility], you and your health needs are our priority.  As part of our continuing mission to provide you with exceptional heart care, we have created designated Provider Care Teams.  These Care Teams include your primary Cardiologist (physician) and Advanced Practice Providers (APPs -  Physician Assistants and Nurse Practitioners) who all work together to provide you with the care you need, when you need it.   Your next appointment:   8 week(s)  Provider:   Thomasene Ripple, DO [address], [place], [place] [address]    Dispo:  No follow-ups on file.   Medication Adjustments/Labs and Tests Ordered: Current medicines are reviewed at length with the patient today.  Concerns regarding medicines are outlined above.  Tests Ordered: No orders of the defined types were placed in this encounter.  Medication Changes: No orders of the defined types were placed in this  encounter.

## 2023-08-03 ENCOUNTER — Telehealth: Payer: Self-pay | Admitting: Pharmacist

## 2023-08-03 ENCOUNTER — Encounter: Payer: Self-pay | Admitting: Cardiology

## 2023-08-03 DIAGNOSIS — I10 Essential (primary) hypertension: Secondary | ICD-10-CM

## 2023-08-03 MED ORDER — CARVEDILOL 6.25 MG PO TABS: 6.2500 mg | ORAL_TABLET | Freq: Two times a day (BID) | ORAL | 3 refills | Status: DC

## 2023-08-03 NOTE — Telephone Encounter (Signed)
 Called and spoke with patient. Having headaches when she takes labetalol. Has noticed if she skips a dose then she does not get a headache. BP this morning was 138/88. Patient is not breastfeeding. Will have patient d/c labetalol and will start carvedilol.

## 2023-08-03 NOTE — Telephone Encounter (Signed)
 Please see 1/13 TE for documentation

## 2023-08-26 ENCOUNTER — Ambulatory Visit: Payer: Managed Care, Other (non HMO) | Attending: Cardiology | Admitting: Cardiology

## 2023-10-12 ENCOUNTER — Other Ambulatory Visit: Payer: Self-pay | Admitting: Internal Medicine

## 2023-10-12 DIAGNOSIS — E039 Hypothyroidism, unspecified: Secondary | ICD-10-CM

## 2023-10-19 ENCOUNTER — Telehealth: Payer: Self-pay | Admitting: Cardiology

## 2023-10-19 ENCOUNTER — Other Ambulatory Visit: Payer: Self-pay | Admitting: Internal Medicine

## 2023-10-19 DIAGNOSIS — E039 Hypothyroidism, unspecified: Secondary | ICD-10-CM

## 2023-10-19 NOTE — Telephone Encounter (Signed)
 Called patient left message on personal voice mail to call back.

## 2023-10-19 NOTE — Telephone Encounter (Signed)
 Pt c/o BP issue: STAT if pt c/o blurred vision, one-sided weakness or slurred speech.  STAT if BP is GREATER than 180/120 TODAY.  STAT if BP is LESS than 90/60 and SYMPTOMATIC TODAY  1. What is your BP concern?   Patient stated her BP has been extremely elevated  2. Have you taken any BP medication today?  Yes  3. What are your last 5 BP readings?  Not available at time of call  4. Are you having any other symptoms (ex. Dizziness, headache, blurred vision, passed out)?   Drowsy, lightheaded   Patient stated her BP medication has been making her drowsy and lightheaded.

## 2023-10-20 NOTE — Telephone Encounter (Signed)
 Patient identification verified by 2 forms. Marilynn Rail, RN    Called and spoke to patient  Patient states:   -BP is going up again   -3/29: 158/110, 162/111  -3/31: 148/90 before Rx   -she stopped taking carvedilol in February due to side effects and BP improving   -Carvedilol makes her lightheaded and drowsy   -restarted carvedilol on 3/29, takes it once daily, concerned about symptoms   -she gets headaches daily  Patient denies:   -chest pain   -SOB/difficulty breathing  Informed patient message sent to Dr. Servando Salina for input/advisement  Advise patient to monitor BP and keep log  Reviewed ED warning signs/precautions  Patient verbalized understanding, no questions at this time

## 2023-10-20 NOTE — Telephone Encounter (Signed)
 Tobb, Kardie, DO  You; Newby, Jasmine M, [place] minutes ago (10:04 AM)    Please bring the patient into see our pharmacy team for cardio OB Thayer Ohm or Belenda Cruise) this is a role   Patient identification verified by 2 forms. Marilynn Rail, RN    Called and spoke to patient  Patient agrees with 4/4 OV at 1:35pm  Patient has no further questions at this time

## 2023-10-23 ENCOUNTER — Encounter: Payer: Self-pay | Admitting: Pharmacist

## 2023-10-23 ENCOUNTER — Ambulatory Visit: Admitting: Pharmacist

## 2023-10-23 VITALS — BP 153/124 | HR 72 | Wt 215.9 lb

## 2023-10-23 DIAGNOSIS — R072 Precordial pain: Secondary | ICD-10-CM | POA: Diagnosis not present

## 2023-10-23 DIAGNOSIS — R0789 Other chest pain: Secondary | ICD-10-CM

## 2023-10-23 DIAGNOSIS — I10 Essential (primary) hypertension: Secondary | ICD-10-CM | POA: Diagnosis not present

## 2023-10-23 MED ORDER — NITROGLYCERIN 0.4 MG SL SUBL: 0.4000 mg | SUBLINGUAL_TABLET | SUBLINGUAL | 3 refills | Status: DC | PRN

## 2023-10-23 MED ORDER — METOPROLOL TARTRATE 100 MG PO TABS: ORAL_TABLET | ORAL | 0 refills | Status: DC

## 2023-10-23 MED ORDER — AMLODIPINE BESYLATE 5 MG PO TABS: 5.0000 mg | ORAL_TABLET | Freq: Every day | ORAL | 3 refills | Status: DC

## 2023-10-23 NOTE — Progress Notes (Addendum)
 Patient ID: Alexandra Escobar                 DOB: 04/14/1981                      MRN: [id number]     HPI: Alexandra Escobar is a 43 y.o. female referred to cardio-OB clinic.  Patient gave birth by c section on 06/18/23. Her pregnancy was complicated by preeclampsia, HTN, swelling, edema, HF, and SOB. Developed hematoma with drainage and was hospitalized for 10 days.  Patient called into clinic and reported increased blood pressure with headaches. Had previously been switched to carvedilol however she self tapered this due to feeling drowsy and lightheaded. Now on carvedilol 6.25mg  once a day. Has not yet today.  Not sleeping well. Continues to live at her parents house but sleeps on a recliner. She still has pain from wound/surgery and the recliner helps keep her on her back.  Reports chest pressure on upper left and hand numbness/tingling. Has trouble holding her baby on her left side.  Has been checking BP at home and noticed diastolic readings consistently over 100 with headaches. Last home BP 158/111.  Current HTN meds:  Carvedilol 6.25mg  once daily    Wt Readings from Last 3 Encounters:  10/23/23 215 lb 14.4 oz (97.9 kg)  07/03/23 234 lb 12.8 oz (106.5 kg)  06/27/23 253 lb 4.9 oz (114.9 kg)   BP Readings from Last 3 Encounters:  10/23/23 (!) 153/124  07/03/23 (!) 142/94  06/27/23 135/71   Pulse Readings from Last 3 Encounters:  10/23/23 72  07/03/23 99  06/27/23 (!) 103    Renal function: CrCl cannot be calculated (Patient's most recent lab result is older than the maximum 21 days allowed.).  Past Medical History:  Diagnosis Date   Anxiety    Depression    Endometriosis    Fatty liver    Hypertension    Hypothyroidism    PCOS (polycystic ovarian syndrome)     Current Outpatient Medications on File Prior to Visit  Medication Sig Dispense Refill   levothyroxine (SYNTHROID) 50 MCG tablet Take 1 tablet (50 mcg total) by mouth daily before breakfast. Schedule an appt for  further refills 60 tablet 0   mirtazapine (REMERON) 15 MG tablet Take 15 mg by mouth at bedtime.     oxyCODONE (ROXICODONE) 5 MG immediate release tablet Take 1 tablet (5 mg total) by mouth every 4 (four) hours as needed for severe pain (pain score 7-10). (Patient not taking: Reported on 07/03/2023) 8 tablet 0   sertraline (ZOLOFT) 100 MG tablet Take 150 mg by mouth daily.     torsemide (DEMADEX) 20 MG tablet Take 1 tablet (20 mg total) by mouth daily for 7 days. 7 tablet 0   No current facility-administered medications on file prior to visit.    No Known Allergies   Assessment/Plan:  1. Hypertension -  HYPERTENSION CONTROL Vitals:   10/23/23 1340 10/23/23 1413  BP: (!) 161/106 (!) 153/124    The patient's blood pressure is elevated above target today.  In order to address the patient's elevated BP: A current anti-hypertensive medication was adjusted today.; A new medication was prescribed today.   Patient BP elevated in room today with chest pressure and headaches. Patient had already reduced carvedilol to once daily and has not taken yet today. Will discontinue at this time and order EKG.  Will start amlodipine 5mg  once daily in the evening. Discussed mechanism of action  and possible adverse effects. Patient voiced understanding. Patient to monitor home BP and reports readings back to clinic. Will contact patient in 1 week for f/u.  D/c carvedilol Start amlodipine 5mg  once daily  Laural Golden, PharmD, BCACP, CDCES, CPP [address] [place], [place], [address] Phone: [phone], Fax: [phone]   Addendum: Patient seen and examined, note reviewed with the signed the pharmacisit. I personally reviewed laboratory data, imaging studies and relevant notes. I independently examined the patient and formulated the important aspects of the plan. I have personally discussed the plan with the patient and/or family. Comments or changes to the note/plan are indicated  below.  She reported concerning chest discomfort.  In office ecg normal but she has risk factors.  Share with the patient that it will be best she get a CCTA - she is in agreement.  CCTA odered   Thomasene Ripple DO, MS [employer] Attending Cardiologist [facility]  [address] [place], [place] [address] [phone] Website: [email or web address]

## 2023-10-23 NOTE — Patient Instructions (Addendum)
 Medication Instructions:  Your physician has recommended you make the following change in your medication:  START: Amlodipine 5 mg once daily in the evening *If you need a refill on your cardiac medications before your next appointment, please call your pharmacy*  Lab Work: CMET, Mag, CBC If you have labs (blood work) drawn today and your tests are completely normal, you will receive your results only by: MyChart Message (if you have MyChart) OR A paper copy in the mail If you have any lab test that is abnormal or we need to change your treatment, we will call you to review the results.  Testing/Procedures:   Your cardiac CT will be scheduled at one of the below locations:   [facility] [address] [place], [place] [address] [phone]  If scheduled at [facility], please arrive at the [facility] and Children's Entrance (Entrance C2) of [facility] 30 minutes prior to test start time. You can use the FREE valet parking offered at entrance C (encouraged to control the heart rate for the test)  Proceed to the [facility] Radiology Department (first floor) to check-in and test prep.  All radiology patients and guests should use entrance C2 at [facility], accessed from [facility], even though the hospital's physical address listed is [address]    Please follow these instructions carefully (unless otherwise directed):  An IV will be required for this test and Nitroglycerin will be given.   On the Night Before the Test: Be sure to Drink plenty of water. Do not consume any caffeinated/decaffeinated beverages or chocolate 12 hours prior to your test. Do not take any antihistamines 12 hours prior to your test.  On the Day of the Test: Drink plenty of water until 1 hour prior to the test. Do not eat any food 1 hour prior to test. You may take your regular medications prior to the test.  Take metoprolol (Lopressor) two  hours prior to test. If you take Furosemide/Hydrochlorothiazide/Spironolactone/Chlorthalidone, please HOLD on the morning of the test. Patients who wear a continuous glucose monitor MUST remove the device prior to scanning. FEMALES- please wear underwire-free bra if available, avoid dresses & tight clothing  After the Test: Drink plenty of water. After receiving IV contrast, you may experience a mild flushed feeling. This is normal. On occasion, you may experience a mild rash up to 24 hours after the test. This is not dangerous. If this occurs, you can take Benadryl 25 mg, Zyrtec, Claritin, or Allegra and increase your fluid intake. (Patients taking Tikosyn should avoid Benadryl, and may take Zyrtec, Claritin, or Allegra) If you experience trouble breathing, this can be serious. If it is severe call 911 IMMEDIATELY. If it is mild, please call our office.  We will call to schedule your test 2-4 weeks out understanding that some insurance companies will need an authorization prior to the service being performed.   For more information and frequently asked questions, please visit our website : [email or web address]  For non-scheduling related questions, please contact the cardiac imaging nurse navigator should you have any questions/concerns: Cardiac Imaging Nurse Navigators Direct Office Dial: [phone]   For scheduling needs, including cancellations and rescheduling, please call [place], [phone].   Follow-Up: At [facility], you and your health needs are our priority.  As part of our continuing mission to provide you with exceptional heart care, our providers are all part of one team.  This team includes your primary Cardiologist (  physician) and Advanced Practice Providers or APPs (Physician Assistants and Nurse Practitioners) who all work together to provide you with the care you need, when you need it.  Your next appointment:   4-6 week(s)  Provider:    Thomasene Ripple, DO    Other Instructions   1st Floor: - Lobby - Registration  - Pharmacy  - Lab - Cafe  2nd Floor: - PV Lab - Diagnostic Testing (echo, CT, nuclear med)  3rd Floor: - Vacant  4th Floor: - TCTS (cardiothoracic surgery) - AFib Clinic - Structural Heart Clinic - Vascular Surgery  - Vascular Ultrasound  5th Floor: - HeartCare Cardiology (general and EP) - Clinical Pharmacy for coumadin, hypertension, lipid, weight-loss medications, and med management appointments    Valet parking services will be available as well.

## 2023-11-02 ENCOUNTER — Encounter (HOSPITAL_COMMUNITY): Payer: Self-pay

## 2023-11-03 ENCOUNTER — Encounter: Payer: Self-pay | Admitting: Pharmacist

## 2023-11-03 DIAGNOSIS — I10 Essential (primary) hypertension: Secondary | ICD-10-CM

## 2023-11-03 DIAGNOSIS — R0789 Other chest pain: Secondary | ICD-10-CM

## 2023-11-04 NOTE — Addendum Note (Signed)
 Addended by: Sunny English on: 11/04/2023 04:20 PM   Modules accepted: Orders

## 2023-11-05 ENCOUNTER — Ambulatory Visit (HOSPITAL_COMMUNITY)
Admission: RE | Admit: 2023-11-05 | Discharge: 2023-11-05 | Disposition: A | Discharge status: Home / Self Care | Source: Ambulatory Visit | Attending: Cardiology | Admitting: Cardiology

## 2023-11-05 DIAGNOSIS — R072 Precordial pain: Secondary | ICD-10-CM | POA: Insufficient documentation

## 2023-11-05 MED ORDER — DILTIAZEM HCL 25 MG/5ML IV SOLN: 10.0000 mg | INTRAVENOUS | Status: DC | PRN

## 2023-11-05 MED ORDER — IOHEXOL 350 MG/ML SOLN
95.0000 mL | Freq: Once | INTRAVENOUS | Status: AC | PRN
  Administered 2023-11-05: 95 mL via INTRAVENOUS

## 2023-11-05 MED ORDER — METOPROLOL TARTRATE 5 MG/5ML IV SOLN: 10.0000 mg | INTRAVENOUS | Status: DC | PRN

## 2023-11-05 MED ORDER — NITROGLYCERIN 0.4 MG SL SUBL
0.8000 mg | SUBLINGUAL_TABLET | Freq: Once | SUBLINGUAL | Status: AC
  Administered 2023-11-05: 0.8 mg via SUBLINGUAL

## 2023-11-05 MED ORDER — NITROGLYCERIN 0.4 MG SL SUBL
SUBLINGUAL_TABLET | SUBLINGUAL | Status: AC
  Filled 2023-11-05: qty 2

## 2023-11-05 MED ORDER — METOPROLOL TARTRATE 5 MG/5ML IV SOLN
INTRAVENOUS | Status: AC
  Filled 2023-11-05: qty 10

## 2023-11-08 ENCOUNTER — Encounter: Payer: Self-pay | Admitting: Cardiology

## 2023-12-04 ENCOUNTER — Other Ambulatory Visit: Payer: Self-pay | Admitting: Pharmacist

## 2023-12-04 ENCOUNTER — Telehealth: Payer: Self-pay | Admitting: Pharmacist

## 2023-12-04 DIAGNOSIS — I10 Essential (primary) hypertension: Secondary | ICD-10-CM

## 2023-12-04 MED ORDER — VALSARTAN 80 MG PO TABS: 80.0000 mg | ORAL_TABLET | Freq: Every day | ORAL | 5 refills | Status: DC

## 2023-12-04 NOTE — Telephone Encounter (Signed)
 Received mychart message from patient that she is hypertensive again. Higher doses of amlodipine  caused swelling.   Contacted patient. No answer, lmom. Will start valsartan 80mg  once daily since patient not breastfeeding. Check BMP in 1-2 weeks

## 2024-01-05 ENCOUNTER — Other Ambulatory Visit: Payer: Self-pay | Admitting: Internal Medicine

## 2024-01-05 DIAGNOSIS — E039 Hypothyroidism, unspecified: Secondary | ICD-10-CM

## 2024-01-11 ENCOUNTER — Ambulatory Visit (INDEPENDENT_AMBULATORY_CARE_PROVIDER_SITE_OTHER): Admitting: Family Medicine

## 2024-01-11 ENCOUNTER — Encounter: Payer: Self-pay | Admitting: Family Medicine

## 2024-01-11 VITALS — BP 130/88 | HR 83 | Temp 98.0°F | Ht 62.0 in

## 2024-01-11 DIAGNOSIS — D2272 Melanocytic nevi of left lower limb, including hip: Secondary | ICD-10-CM | POA: Diagnosis not present

## 2024-01-11 DIAGNOSIS — E039 Hypothyroidism, unspecified: Secondary | ICD-10-CM

## 2024-01-11 DIAGNOSIS — I10 Essential (primary) hypertension: Secondary | ICD-10-CM | POA: Diagnosis not present

## 2024-01-11 LAB — CBC WITH DIFFERENTIAL/PLATELET
Basophils Absolute: 0 10*3/uL (ref 0.0–0.1)
Basophils Relative: 0.5 % (ref 0.0–3.0)
Eosinophils Absolute: 0.2 10*3/uL (ref 0.0–0.7)
Eosinophils Relative: 2.5 % (ref 0.0–5.0)
HCT: 39.8 % (ref 36.0–46.0)
Hemoglobin: 13.2 g/dL (ref 12.0–15.0)
Lymphocytes Relative: 39.2 % (ref 12.0–46.0)
Lymphs Abs: 3.4 10*3/uL (ref 0.7–4.0)
MCHC: 33.2 g/dL (ref 30.0–36.0)
MCV: 89 fl (ref 78.0–100.0)
Monocytes Absolute: 0.6 10*3/uL (ref 0.1–1.0)
Monocytes Relative: 6.4 % (ref 3.0–12.0)
Neutro Abs: 4.5 10*3/uL (ref 1.4–7.7)
Neutrophils Relative %: 51.4 % (ref 43.0–77.0)
Platelets: 266 10*3/uL (ref 150.0–400.0)
RBC: 4.47 Mil/uL (ref 3.87–5.11)
RDW: 15.5 % (ref 11.5–15.5)
WBC: 8.7 10*3/uL (ref 4.0–10.5)

## 2024-01-11 LAB — TSH: TSH: 1.06 u[IU]/mL (ref 0.35–5.50)

## 2024-01-11 NOTE — Assessment & Plan Note (Signed)
 May make procedure appointment with me to have mole removed

## 2024-01-11 NOTE — Assessment & Plan Note (Signed)
 Controlled, continue valsartan

## 2024-01-11 NOTE — Assessment & Plan Note (Signed)
 TSH today

## 2024-01-11 NOTE — Progress Notes (Signed)
   Acute Office Visit  Subjective:     Patient ID: Alexandra Escobar, female    DOB: 03-18-1981, 43 y.o.   MRN: [id number]  Chief Complaint  Patient presents with   Follow-up    HPI Patient is in today for refill of levothyroxine . Has not had labs drawn since October 2024. She is not fasting today. States that she took her last dose of Synthroid  today.  States that she has a mole that is on her underwear line, that she would like to have removed because it gets irritated when she walks and close throughout the area. Denies any bleeding, discharge, itching from the area.  Denies other concerns today.  ROS Per HPI      Objective:    BP 130/88 (BP Location: Left Arm, Patient Position: Sitting)   Pulse 83   Temp 98 F (36.7 C) (Temporal)   Ht 5' 2 (1.575 m)   SpO2 97%   BMI 39.49 kg/m    Physical Exam Vitals and nursing note reviewed.  Constitutional:      General: She is not in acute distress.    Appearance: Normal appearance. She is obese.  HENT:     Head: Normocephalic and atraumatic.     Right Ear: External ear normal.     Left Ear: External ear normal.     Nose: Nose normal.     Mouth/Throat:     Mouth: Mucous membranes are moist.     Pharynx: Oropharynx is clear.   Eyes:     Extraocular Movements: Extraocular movements intact.     Pupils: Pupils are equal, round, and reactive to light.    Cardiovascular:     Rate and Rhythm: Normal rate and regular rhythm.     Pulses: Normal pulses.     Heart sounds: Normal heart sounds.  Pulmonary:     Effort: Pulmonary effort is normal. No respiratory distress.     Breath sounds: Normal breath sounds. No wheezing, rhonchi or rales.   Musculoskeletal:        General: Normal range of motion.     Cervical back: Normal range of motion.     Right lower leg: No edema.     Left lower leg: No edema.  Lymphadenopathy:     Cervical: No cervical adenopathy.   Skin:    Findings: Lesion (papular skin colored lesion to left  buttock) present.   Neurological:     General: No focal deficit present.     Mental Status: She is alert and oriented to person, place, and time.   Psychiatric:        Mood and Affect: Mood normal.        Thought Content: Thought content normal.     No results found for any visits on 01/11/24.      Assessment & Plan:   Acquired hypothyroidism Assessment & Plan: TSH today  Orders: -     TSH  Primary hypertension Assessment & Plan: Controlled, continue valsartan   Orders: -     CBC with Differential/Platelet -     Comprehensive metabolic panel with GFR  Melanocytic nevus of left lower extremity Assessment & Plan: May make procedure appointment with me to have mole removed      No orders of the defined types were placed in this encounter.   Return for Mole removal (40 mins procedure time).  Corean LITTIE Ku, FNP

## 2024-01-12 ENCOUNTER — Ambulatory Visit: Payer: Self-pay | Admitting: Family Medicine

## 2024-01-12 DIAGNOSIS — E039 Hypothyroidism, unspecified: Secondary | ICD-10-CM

## 2024-01-12 LAB — COMPREHENSIVE METABOLIC PANEL WITH GFR
ALT: 32 U/L (ref 0–35)
AST: 24 U/L (ref 0–37)
Albumin: 4 g/dL (ref 3.5–5.2)
Alkaline Phosphatase: 82 U/L (ref 39–117)
BUN: 10 mg/dL (ref 6–23)
CO2: 23 meq/L (ref 19–32)
Calcium: 9.2 mg/dL (ref 8.4–10.5)
Chloride: 105 meq/L (ref 96–112)
Creatinine, Ser: 0.57 mg/dL (ref 0.40–1.20)
GFR: 113.39 mL/min (ref >60.00)
Glucose, Bld: 100 mg/dL — ABNORMAL HIGH (ref 70–99)
Potassium: 4 meq/L (ref 3.5–5.1)
Sodium: 138 meq/L (ref 135–145)
Total Bilirubin: 0.3 mg/dL (ref 0.2–1.2)
Total Protein: 6.8 g/dL (ref 6.0–8.3)

## 2024-01-12 MED ORDER — LEVOTHYROXINE SODIUM 50 MCG PO TABS: 50.0000 ug | ORAL_TABLET | Freq: Every day | ORAL | 1 refills | Status: DC

## 2024-01-14 ENCOUNTER — Ambulatory Visit: Admitting: Family Medicine

## 2024-01-27 ENCOUNTER — Encounter: Payer: Self-pay | Admitting: Internal Medicine

## 2024-01-27 ENCOUNTER — Ambulatory Visit: Admitting: Internal Medicine

## 2024-01-27 ENCOUNTER — Encounter: Payer: Self-pay | Admitting: *Deleted

## 2024-01-27 ENCOUNTER — Ambulatory Visit: Payer: Self-pay | Admitting: Internal Medicine

## 2024-01-27 VITALS — BP 104/78 | HR 77 | Temp 98.5°F | Resp 16 | Ht 62.0 in | Wt 213.6 lb

## 2024-01-27 DIAGNOSIS — L905 Scar conditions and fibrosis of skin: Secondary | ICD-10-CM

## 2024-01-27 DIAGNOSIS — R292 Abnormal reflex: Secondary | ICD-10-CM | POA: Insufficient documentation

## 2024-01-27 DIAGNOSIS — Z8742 Personal history of other diseases of the female genital tract: Secondary | ICD-10-CM | POA: Insufficient documentation

## 2024-01-27 DIAGNOSIS — G4451 Hemicrania continua: Secondary | ICD-10-CM | POA: Diagnosis not present

## 2024-01-27 DIAGNOSIS — I950 Idiopathic hypotension: Secondary | ICD-10-CM

## 2024-01-27 DIAGNOSIS — Z8489 Family history of other specified conditions: Secondary | ICD-10-CM | POA: Insufficient documentation

## 2024-01-27 DIAGNOSIS — B009 Herpesviral infection, unspecified: Secondary | ICD-10-CM | POA: Insufficient documentation

## 2024-01-27 LAB — BASIC METABOLIC PANEL WITH GFR
BUN: 9 mg/dL (ref 6–23)
CO2: 26 meq/L (ref 19–32)
Calcium: 8.8 mg/dL (ref 8.4–10.5)
Chloride: 106 meq/L (ref 96–112)
Creatinine, Ser: 0.55 mg/dL (ref 0.40–1.20)
GFR: 114.34 mL/min (ref >60.00)
Glucose, Bld: 96 mg/dL (ref 70–99)
Potassium: 3.7 meq/L (ref 3.5–5.1)
Sodium: 138 meq/L (ref 135–145)

## 2024-01-27 LAB — C-REACTIVE PROTEIN: CRP: 1.5 mg/dL (ref 0.5–20.0)

## 2024-01-27 LAB — CORTISOL: Cortisol, Plasma: 7.2 ug/dL

## 2024-01-27 NOTE — Patient Instructions (Signed)
General Headache Without Cause A headache is pain or discomfort felt around the head or neck area. There are many causes and types of headaches. A few common types include: Tension headaches. Migraine headaches. Cluster headaches. Chronic daily headaches. Sometimes, the specific cause of a headache may not be found. Follow these instructions at home: Watch your condition for any changes. Let your health care provider know about them. Take these steps to help with your condition: Managing pain     Take over-the-counter and prescription medicines only as told by your health care provider. Treatment may include medicines for pain that are taken by mouth or applied to the skin. Lie down in a dark, quiet room when you have a headache. Keep lights dim if bright lights bother you or make your headaches worse. If directed, put ice on your head and neck area: Put ice in a plastic bag. Place a towel between your skin and the bag. Leave the ice on for 20 minutes, 2-3 times per day. Remove the ice if your skin turns bright red. This is very important. If you cannot feel pain, heat, or cold, you have a greater risk of damage to the area. If directed, apply heat to the affected area. Use the heat source that your health care provider recommends, such as a moist heat pack or a heating pad. Place a towel between your skin and the heat source. Leave the heat on for 20-30 minutes. Remove the heat if your skin turns bright red. This is especially important if you are unable to feel pain, heat, or cold. You have a greater risk of getting burned. Eating and drinking Eat meals on a regular schedule. If you drink alcohol: Limit how much you have to: 0-1 drink a day for women who are not pregnant. 0-2 drinks a day for men. Know how much alcohol is in a drink. In the [place] one drink equals one 12 oz bottle of beer (355 mL), one 5 oz glass of wine (148 mL), or one 1 oz glass of hard liquor (44 mL). Stop  drinking caffeine, or decrease the amount of caffeine you drink. Drink enough fluid to keep your urine pale yellow. General instructions  Keep a headache journal to help find out what may trigger your headaches. For example, write down: What you eat and drink. How much sleep you get. Any change to your diet or medicines. Try massage or other relaxation techniques. Limit stress. Sit up straight, and do not tense your muscles. Do not use any products that contain nicotine or tobacco. These products include cigarettes, chewing tobacco, and vaping devices, such as e-cigarettes. If you need help quitting, ask your health care provider. Exercise regularly as told by your health care provider. Sleep on a regular schedule. Get 7-9 hours of sleep each night, or the amount recommended by your health care provider. Keep all follow-up visits. This is important. Contact a health care provider if: Medicine does not help your symptoms. You have a headache that is different from your usual headache. You have nausea or you vomit. You have a fever. Get help right away if: Your headache: Becomes severe quickly. Gets worse after moderate to intense physical activity. You have any of these symptoms: Repeated vomiting. Pain or stiffness in your neck. Changes to your vision. Pain in an eye or ear. Problems with speech. Muscular weakness or loss of muscle control. Loss of balance or coordination. You feel faint or pass out. You have confusion. You have  a seizure. These symptoms may represent a serious problem that is an emergency. Do not wait to see if the symptoms will go away. Get medical help right away. Call your local emergency services (911 in the [place] Do not drive yourself to the hospital. Summary A headache is pain or discomfort felt around the head or neck area. There are many causes and types of headaches. In some cases, the cause may not be found. Keep a headache journal to help find out  what may trigger your headaches. Watch your condition for any changes. Let your health care provider know about them. Contact a health care provider if you have a headache that is different from the usual headache, or if your symptoms are not helped by medicine. Get help right away if your headache becomes severe, you vomit, you have a loss of vision, you lose your balance, or you have a seizure. This information is not intended to replace advice given to you by your health care provider. Make sure you discuss any questions you have with your health care provider. Document Revised: 12/05/2020 Document Reviewed: 12/05/2020 Elsevier Patient Education  2024 [employer].

## 2024-01-27 NOTE — Progress Notes (Unsigned)
 Subjective:  Patient ID: Alexandra Escobar, female    DOB: 26-Jun-1981  Age: 43 y.o. MRN: [id number]  CC: Hypertension and Hypothyroidism   HPI Alexandra Escobar presents for f/up ---  Discussed the use of AI scribe software for clinical note transcription with the patient, who gave verbal consent to proceed.  History of Present Illness   Alexandra Escobar is a 43 year old female who presents with fatigue and headaches.  She has been experiencing significant fatigue, which she attributes to postpartum recovery. Her baby is 67 old, and she has resumed her menstrual cycles, with the last cycle starting on Sunday the sixth. Despite normal thyroid  levels, the fatigue persists and impacts her daily activities.  Headaches have been present for the past two to three months, increasing in frequency and intensity. They are primarily located on the left side and are described as intense, sometimes feeling like an 'echo' or emptiness. The headaches are intermittent but have become more frequent, occasionally waking her from sleep. She has not taken any medication for the headaches but finds some relief with cold water  and caffeine . She is cautious with caffeine  due to her history of high blood pressure.  She experiences lightheadedness, particularly when lying down, with a sensation of falling. This is accompanied by visual disturbances, slurred speech, and difficulty walking, which is concerning when holding her baby. She also experiences numbness and tingling in her left arm. No nausea or vomiting is reported.  Postpartum, she had a C-section complicated by a hematoma, requiring a second surgery four to five days later. She feels the scar is not healing well, with persistent swelling, itching, and pain. There is a sensation of movement and discomfort when massaging the area, and the scar's appearance and symptoms are a source of concern.       Outpatient Medications Prior to Visit  Medication Sig Dispense  Refill   levothyroxine  (SYNTHROID ) 50 MCG tablet Take 1 tablet (50 mcg total) by mouth daily before breakfast. Schedule an appt for further refills 90 tablet 1   sertraline  (ZOLOFT ) 100 MG tablet Take 150 mg by mouth daily.     valsartan  (DIOVAN ) 80 MG tablet Take 1 tablet (80 mg total) by mouth daily. 30 tablet 5   No facility-administered medications prior to visit.    ROS Review of Systems  Constitutional:  Positive for fatigue. Negative for appetite change, chills, diaphoresis, fever and unexpected weight change.  HENT: Negative.    Eyes:  Positive for visual disturbance.  Respiratory: Negative.  Negative for cough, chest tightness, shortness of breath and wheezing.   Cardiovascular:  Negative for chest pain, palpitations and leg swelling.  Gastrointestinal:  Negative for abdominal pain, constipation, diarrhea, nausea and vomiting.  Endocrine: Negative.   Genitourinary: Negative.  Negative for difficulty urinating.  Musculoskeletal:  Positive for gait problem.  Skin: Negative.   Allergic/Immunologic: Negative.   Neurological:  Positive for speech difficulty, light-headedness, numbness and headaches. Negative for seizures, syncope and weakness.  Hematological:  Negative for adenopathy. Does not bruise/bleed easily.  Psychiatric/Behavioral:  Positive for dysphoric mood. Negative for decreased concentration and suicidal ideas. The patient is not nervous/anxious.     Objective:  BP 104/78 (BP Location: Left Arm, Patient Position: Sitting, Cuff Size: Normal) Comment: BP (R) 112/82  Pulse 77   Temp 98.5 F (36.9 C) (Oral)   Resp 16   Ht 5' 2 (1.575 m)   Wt 213 lb 9.6 oz (96.9 kg)   LMP 01/24/2024 (Exact Date)  SpO2 94%   Breastfeeding No   BMI 39.07 kg/m   BP Readings from Last 3 Encounters:  01/27/24 104/78  01/11/24 130/88  11/05/23 108/76    Wt Readings from Last 3 Encounters:  01/27/24 213 lb 9.6 oz (96.9 kg)  10/23/23 215 lb 14.4 oz (97.9 kg)  07/03/23 234 lb  12.8 oz (106.5 kg)    Physical Exam Vitals reviewed.  Constitutional:      Appearance: Normal appearance.  HENT:     Nose: Nose normal.     Mouth/Throat:     Mouth: Mucous membranes are moist.  Eyes:     General: No scleral icterus.    Conjunctiva/sclera: Conjunctivae normal.  Cardiovascular:     Rate and Rhythm: Normal rate and regular rhythm.     Heart sounds: No murmur heard.    No friction rub. No gallop.     Comments: EKG--- NSR, 72 bpm No LVH, Q waves, or ST/T wave changes  Pulmonary:     Effort: Pulmonary effort is normal.     Breath sounds: No stridor. No wheezing, rhonchi or rales.  Abdominal:     General: Abdomen is flat.     Palpations: There is no mass.     Tenderness: There is no abdominal tenderness. There is no guarding.     Hernia: No hernia is present.  Musculoskeletal:        General: Normal range of motion.     Cervical back: Neck supple.     Right lower leg: No edema.     Left lower leg: No edema.  Lymphadenopathy:     Cervical: No cervical adenopathy.  Skin:    General: Skin is warm and dry.  Neurological:     General: No focal deficit present.     Mental Status: She is alert.     Cranial Nerves: Cranial nerves 2-12 are intact.     Sensory: Sensation is intact.     Motor: Motor function is intact.     Coordination: Coordination is intact.     Gait: Gait is intact. Gait normal.     Deep Tendon Reflexes: Reflexes normal. Babinski sign absent on the right side. Babinski sign present on the left side.     Reflex Scores:      Tricep reflexes are 1+ on the right side and 1+ on the left side.      Bicep reflexes are 1+ on the right side and 1+ on the left side.      Brachioradialis reflexes are 1+ on the right side and 1+ on the left side.      Patellar reflexes are 1+ on the right side and 1+ on the left side.      Achilles reflexes are 1+ on the right side and 1+ on the left side.    Lab Results  Component Value Date   WBC 8.7 01/11/2024    HGB 13.2 01/11/2024   HCT 39.8 01/11/2024   PLT 266.0 01/11/2024   GLUCOSE 96 01/27/2024   ALT 32 01/11/2024   AST 24 01/11/2024   NA 138 01/27/2024   K 3.7 01/27/2024   CL 106 01/27/2024   CREATININE 0.55 01/27/2024   BUN 9 01/27/2024   CO2 26 01/27/2024   TSH 1.06 01/11/2024   HGBA1C 5.9 06/26/2022    CT CORONARY MORPH W/CTA COR W/SCORE W/CA W/CM &/OR WO/CM Addendum Date: 11/08/2023 ADDENDUM REPORT: 11/08/2023 17:47 EXAM: OVER-READ INTERPRETATION  CT CHEST The following report is an over-read  performed by radiologist Dr. Andrea Gasman of [facility] on 11/08/2023. This over-read does not include interpretation of cardiac or coronary anatomy or pathology. The coronary CTA interpretation by the cardiologist is attached. COMPARISON:  Chest CT 04/21/2022 FINDINGS: Vascular: No aortic atherosclerosis. The included aorta is normal in caliber. Mediastinum/nodes: No adenopathy or mass. Unremarkable esophagus. Lungs: Mild subsegmental atelectasis or scarring within both lower lobes and the lingula. No pulmonary nodule. No pleural fluid. The included airways are patent. Upper abdomen: No acute or unexpected findings. Musculoskeletal: There are no acute or suspicious osseous abnormalities. IMPRESSION: No acute or unexpected extracardiac findings. Electronically Signed   By: Andrea Gasman M.D.   On: 11/08/2023 17:47   Result Date: 11/08/2023 HISTORY: Chest pain, nonspecific EXAM: Cardiac/Coronary CT TECHNIQUE: The patient was scanned on a [employer]. PROTOCOL: A 100 kV prospective scan was triggered in the descending thoracic aorta at 111 HU's. Axial non-contrast 3 mm slices were carried out through the heart. The data set was analyzed on a dedicated work station and scored using the Agatston method. Gantry rotation speed was 250 msecs and collimation was 0.6 mm. Heart rate was optimized medically and sl NTG was given. The 3D data set was reconstructed in 5% intervals of the  35-75 % of the R-R cycle. Systolic and diastolic phases were analyzed on a dedicated work station using MPR, MIP and VRT modes. The patient received 95mL OMNIPAQUE  IOHEXOL  350 MG/ML SOLN of contrast. FINDINGS: Coronary calcium  score: The patient's coronary artery calcium  score is 0, which places the patient in the 0 percentile. Coronary arteries: Normal coronary origins.  Right dominance. Right Coronary Artery: Normal caliber vessel, gives rise to PDA. No significant plaque or stenosis. Left Main Coronary Artery: Normal caliber vessel. No significant plaque or stenosis. Ramus intermedius: Normal caliber vessel. No significant plaque or stenosis. Left Anterior Descending Coronary Artery: Normal caliber vessel. No significant plaque or stenosis. Gives rise to small first, normal second diagonal branches. Distal LAD wraps apex. Left Circumflex Artery: Normal caliber vessel. No significant plaque or stenosis. Gives rise to three OM branches. Aorta: Normal size, 30 mm at the mid ascending aorta (level of the PA bifurcation) measured double oblique. No aortic atherosclerosis. No dissection seen in visualized portions of the aorta. Aortic Valve: No calcifications. Trileaflet. Other findings: Normal pulmonary vein drainage into the left atrium. Normal left atrial appendage without a thrombus. Normal size of the pulmonary artery. Normal appearance of the pericardium. IMPRESSION: 1. No evidence of CAD, CADRADS = 0. 2. Coronary calcium  score of 0. This was 0 percentile for age-, sex-, and race- matched controls. 3.  No plaque detected by heartflow. 4. Normal coronary origin with right dominance. INTERPRETATION: CAD-RADS 0: No evidence of CAD (0%). Consider non-atherosclerotic causes of chest pain. Electronically Signed: By: Shelda Bruckner M.D. On: 11/06/2023 13:27    Assessment & Plan:  Idiopathic hypotension- Labs and EKG are reassuring. Will discontinue the ARB. -     Basic metabolic panel with GFR; Future -      Cortisol; Future -     EKG 12-Lead  Hemicrania continua -     MR BRAIN WO CONTRAST; Future -     C-reactive protein; Future  Babinski sign positive in left foot -     MR BRAIN WO CONTRAST; Future  Scar irritation -     Amb referral to Pediatric Plastic Surgery     Follow-up: Return in about 3 months (around 04/28/2024).  Cannie Molt, MD

## 2024-01-28 ENCOUNTER — Encounter: Payer: Self-pay | Admitting: Internal Medicine

## 2024-01-29 ENCOUNTER — Encounter: Payer: Self-pay | Admitting: Cardiology

## 2024-01-29 ENCOUNTER — Ambulatory Visit: Attending: Cardiology | Admitting: Cardiology

## 2024-01-29 VITALS — BP 134/93 | HR 81 | Ht 62.0 in | Wt 215.8 lb

## 2024-01-29 DIAGNOSIS — O1494 Unspecified pre-eclampsia, complicating childbirth: Secondary | ICD-10-CM | POA: Diagnosis present

## 2024-01-29 DIAGNOSIS — Z8759 Personal history of other complications of pregnancy, childbirth and the puerperium: Secondary | ICD-10-CM | POA: Diagnosis not present

## 2024-01-29 DIAGNOSIS — I10 Essential (primary) hypertension: Secondary | ICD-10-CM | POA: Insufficient documentation

## 2024-01-29 NOTE — Patient Instructions (Signed)
 Medication Instructions:  Your physician recommends that you continue on your current medications as directed. Please refer to the Current Medication list given to you today.  *If you need a refill on your cardiac medications before your next appointment, please call your pharmacy*  Follow-Up: At [facility], you and your health needs are our priority.  As part of our continuing mission to provide you with exceptional heart care, our providers are all part of one team.  This team includes your primary Cardiologist (physician) and Advanced Practice Providers or APPs (Physician Assistants and Nurse Practitioners) who all work together to provide you with the care you need, when you need it.  Your next appointment:   16 week(s)  Provider:   Kardie Tobb, DO     Other Instructions Please take your blood pressure daily for 2 weeks and send in a MyChart message. Please include heart rates. (One message at the end of the 2 weeks).   HOW TO TAKE YOUR BLOOD PRESSURE: Rest 5 minutes before taking your blood pressure. Don't smoke or drink caffeinated beverages for at least 30 minutes before. Take your blood pressure before (not after) you eat. Sit comfortably with your back supported and both feet on the floor (don't cross your legs). Elevate your arm to heart level on a table or a desk. Use the proper sized cuff. It should fit smoothly and snugly around your bare upper arm. There should be enough room to slip a fingertip under the cuff. The bottom edge of the cuff should be 1 inch above the crease of the elbow. Ideally, take 3 measurements at one sitting and record the average.

## 2024-01-30 NOTE — Progress Notes (Signed)
 Cardio-Obstetrics Clinic  Follow Up Note   Date:  01/30/2024   ID:  Alexandra Escobar, DOB Jul 31, 1980, MRN [id number]  PCP:  Joshua Verniece CROME, MD   [facility] Providers Cardiologist:  Dub Huntsman, DO  Electrophysiologist:  None        Referring MD: Joshua Rebecca CROME, MD   Chief Complaint:  I am ok   History of Present Illness:    Alexandra Escobar is a 43 y.o. female [G3P0121] who returns for follow up of  chronic hypertension with superimposed preeclampsia with severe features at [redacted]w[redacted]d., underwent induction due to preeclampsia and status post c-section, hypothyroidism, IVF pregnancy.   During her postpartum stay she went into heart failure exacerbation which I suspect was high output heart failure due to anemia. She was diuresed.  Her hospital stay postpartum was also complicated on POD #3 by incisional drainage and on POD#4 a wound hematoma was noted and drained with bedside pressure.  Packing was placed. She underwent packing changes but determined that she would benefit from opening of the incision, removal of hematoma.  On 06/24/2023 this was completed and the wound was closed.   On 06/25/2023, a low grade fever was noted and empiric IV antibiotics were begun.  CTAP was reassuring, as was CXR.  Her condition continued to improve and after 24 hours without a fever, she was transitioned to oral augmentin .  Dressing changes were done in order to continue wound assessment.   She was seen by me on 06/2024 at that time she was experiencing chest pain and I thought this was inflammatory - she was given Ibuprofen . This did not helped and chest pain continued eventually CCTA was ordered which did show any evidence of coronary artery disease.   Since her last visit with our team her antihypertensive medications have been stopped by her PCP due to low blood pressure and lightheadedness. She pending neurological workup with MRI.   No specific cardiac complaints today     Prior CV Studies  Reviewed: The following studies were reviewed today: Echo   Past Medical History:  Diagnosis Date   Allergy Seasonal   Anemia    In the past   Anxiety    Chronic kidney disease    Fatty liver   Depression    Endometriosis    Fatty liver    Hypertension    Hypothyroidism    PCOS (polycystic ovarian syndrome)     Past Surgical History:  Procedure Laterality Date   CESAREAN SECTION N/A 06/18/2023   Procedure: CESAREAN SECTION;  Surgeon: Curlene Agent, MD;  Location: [facility] ORS;  Service: Obstetrics;  Laterality: N/A;   COLON SURGERY     Fistula   DILATION AND CURETTAGE OF UTERUS  06/2021   HEMATOMA EVACUATION N/A 06/24/2023   Procedure: EVACUATION HEMATOMA INCISIONAL;  Surgeon: Marne Kelly Nest, MD;  Location: [facility] OR;  Service: Gynecology;  Laterality: N/A;   LAPAROSCOPY     for endometriosis   NASAL SEPTUM SURGERY     OTHER SURGICAL HISTORY     fibroid removal   OTHER SURGICAL HISTORY     fistula drainage      OB History     Gravida  3   Para  1   Term      Preterm  1   AB  2   Living  1      SAB  2   IAB      Ectopic      Multiple  0   Live Births  1               Current Medications: Current Meds  Medication Sig   levothyroxine  (SYNTHROID ) 50 MCG tablet Take 1 tablet (50 mcg total) by mouth daily before breakfast. Schedule an appt for further refills   sertraline  (ZOLOFT ) 100 MG tablet Take 150 mg by mouth daily.     Allergies:   Patient has no known allergies.   Social History   Socioeconomic History   Marital status: Married    Spouse name: Not on file   Number of children: Not on file   Years of education: Not on file   Highest education level: Some college, no degree  Occupational History   Not on file  Tobacco Use   Smoking status: Never    Passive exposure: Never   Smokeless tobacco: Never  Vaping Use   Vaping status: Never Used  Substance and Sexual Activity   Alcohol use: Never   Drug use: Never   Sexual  activity: Not Currently    Partners: Male    Birth control/protection: None  Other Topics Concern   Not on file  Social History Narrative   Not on file   Social Drivers of Health   Financial Resource Strain: Low Risk  (04/30/2023)   Overall Financial Resource Strain (CARDIA)    Difficulty of Paying Living Expenses: Not very hard  Food Insecurity: No Food Insecurity (06/17/2023)   Hunger Vital Sign    Worried About Running Out of Food in the Last Year: Never true    Ran Out of Food in the Last Year: Never true  Transportation Needs: No Transportation Needs (06/17/2023)   PRAPARE - Administrator, Civil Service (Medical): No    Lack of Transportation (Non-Medical): No  Physical Activity: Unknown (04/30/2023)   Exercise Vital Sign    Days of Exercise per Week: 0 days    Minutes of Exercise per Session: Not on file  Stress: No Stress Concern Present (04/30/2023)   [employer] of Occupational Health - Occupational Stress Questionnaire    Feeling of Stress : Not at all  Social Connections: Moderately Integrated (04/30/2023)   Social Connection and Isolation Panel    Frequency of Communication with Friends and Family: More than three times a week    Frequency of Social Gatherings with Friends and Family: Twice a week    Attends Religious Services: 1 to 4 times per year    Active Member of [employer] or Organizations: No    Attends Engineer, structural: Not on file    Marital Status: Married      Family History  Problem Relation Age of Onset   Hypertension Father    Depression Father    Anxiety disorder Father    Hypertension Sister    Cancer Maternal Grandmother        breast   Hypertension Paternal Grandmother       ROS:   Please see the history of present illness.     All other systems reviewed and are negative.   Labs/EKG Reviewed:    EKG:   EKG was no  ordered today.    Recent Labs: 06/04/2023: B Natriuretic Peptide 52.4 06/19/2023:  Magnesium  3.9 01/11/2024: ALT 32; Hemoglobin 13.2; Platelets 266.0; TSH 1.06 01/27/2024: BUN 9; Creatinine, Ser 0.55; Potassium 3.7; Sodium 138   Recent Lipid Panel No results found for: CHOL, TRIG, HDL, CHOLHDL, LDLCALC, LDLDIRECT  Physical Exam:  VS:  BP (!) 134/93 (BP Location: Left Arm, Patient Position: Sitting, Cuff Size: Normal)   Pulse 81   Ht 5' 2 (1.575 m)   Wt 215 lb 12.8 oz (97.9 kg)   LMP 01/24/2024 (Exact Date)   SpO2 96%   BMI 39.47 kg/m     Wt Readings from Last 3 Encounters:  01/29/24 215 lb 12.8 oz (97.9 kg)  01/27/24 213 lb 9.6 oz (96.9 kg)  10/23/23 215 lb 14.4 oz (97.9 kg)     GEN:  Well nourished, well developed in no acute distress HEENT: Normal NECK: No JVD; No carotid bruits LYMPHATICS: No lymphadenopathy CARDIAC: RRR, no murmurs, rubs, gallops RESPIRATORY:  Clear to auscultation without rales, wheezing or rhonchi  ABDOMEN: Soft, non-tender, non-distended MUSCULOSKELETAL:  No edema; No deformity  SKIN: Warm and dry NEUROLOGIC:  Alert and oriented x 3 PSYCHIATRIC:  Normal affect    Risk Assessment/Risk Calculators:                 ASSESSMENT & PLAN:     Chronic hypertension  Hx of preeclampsia   Her blood pressure is not at target in the office today but will not reintroduce her antihypertensive given her recent symptomatic hypotension.   We will follow her closely. Once she has completed her neurologic work up to reassess her hypertension control. Target blood pressure is <130/90 mmHg.  Patient Instructions  Medication Instructions:  Your physician recommends that you continue on your current medications as directed. Please refer to the Current Medication list given to you today.  *If you need a refill on your cardiac medications before your next appointment, please call your pharmacy*  Follow-Up: At [facility], you and your health needs are our priority.  As part of our continuing mission to provide you  with exceptional heart care, our providers are all part of one team.  This team includes your primary Cardiologist (physician) and Advanced Practice Providers or APPs (Physician Assistants and Nurse Practitioners) who all work together to provide you with the care you need, when you need it.  Your next appointment:   16 week(s)  Provider:   Nandan Willems, DO     Other Instructions Please take your blood pressure daily for 2 weeks and send in a MyChart message. Please include heart rates. (One message at the end of the 2 weeks).   HOW TO TAKE YOUR BLOOD PRESSURE: Rest 5 minutes before taking your blood pressure. Don't smoke or drink caffeinated beverages for at least 30 minutes before. Take your blood pressure before (not after) you eat. Sit comfortably with your back supported and both feet on the floor (don't cross your legs). Elevate your arm to heart level on a table or a desk. Use the proper sized cuff. It should fit smoothly and snugly around your bare upper arm. There should be enough room to slip a fingertip under the cuff. The bottom edge of the cuff should be 1 inch above the crease of the elbow. Ideally, take 3 measurements at one sitting and record the average.    Dispo:  No follow-ups on file.   Medication Adjustments/Labs and Tests Ordered: Current medicines are reviewed at length with the patient today.  Concerns regarding medicines are outlined above.  Tests Ordered: No orders of the defined types were placed in this encounter.  Medication Changes: No orders of the defined types were placed in this encounter.

## 2024-02-03 ENCOUNTER — Ambulatory Visit
Admission: RE | Admit: 2024-02-03 | Discharge: 2024-02-03 | Disposition: A | Discharge status: Home / Self Care | Source: Ambulatory Visit | Attending: Internal Medicine | Admitting: Internal Medicine

## 2024-02-03 DIAGNOSIS — R292 Abnormal reflex: Secondary | ICD-10-CM

## 2024-02-03 DIAGNOSIS — G4451 Hemicrania continua: Secondary | ICD-10-CM

## 2024-02-04 ENCOUNTER — Ambulatory Visit: Admitting: Family Medicine

## 2024-02-04 ENCOUNTER — Encounter: Payer: Self-pay | Admitting: Family Medicine

## 2024-02-04 VITALS — BP 121/88 | HR 84 | Temp 98.0°F | Ht 62.0 in | Wt 216.0 lb

## 2024-02-04 DIAGNOSIS — L918 Other hypertrophic disorders of the skin: Secondary | ICD-10-CM

## 2024-02-04 DIAGNOSIS — D2272 Melanocytic nevi of left lower limb, including hip: Secondary | ICD-10-CM

## 2024-02-04 NOTE — Patient Instructions (Signed)
 We have removed the mole to your left buttock today.  Your skin may be discolored around the area for a few days since we used silver nitrate to help stop the bleeding.  Please follow-up with us  for any redness, swelling, discolored drainage from the area, chills, fever, other concerns for infection.  Otherwise, follow-up with PCP as scheduled, sooner if needed.

## 2024-02-04 NOTE — Progress Notes (Signed)
   Acute Office Visit  Subjective:     Patient ID: Alexandra Escobar, female    DOB: 08-Sep-1980, 43 y.o.   MRN: [id number]  Chief Complaint  Patient presents with   Procedure    HPI  Discussed the use of AI scribe software for clinical note transcription with the patient, who gave verbal consent to proceed.  History of Present Illness She is a 43 year old female who presents for mole removal from the left buttock.  Recurrent pigmented lesion of the left buttock - Pigmented lesion on the left buttock previously excised three years ago with recurrence - Lesion becomes irritated by clothing and underwear - No bleeding from the lesion     ROS Per HPI      Objective:    BP 121/88 (BP Location: Left Arm, Patient Position: Sitting)   Pulse 84   Temp 98 F (36.7 C) (Temporal)   Ht 5' 2 (1.575 m)   Wt 216 lb (98 kg)   LMP 01/24/2024 (Exact Date)   SpO2 96%   BMI 39.51 kg/m    Physical Exam Vitals and nursing note reviewed.  Constitutional:      General: She is not in acute distress.    Appearance: Normal appearance.  HENT:     Head: Normocephalic and atraumatic.  Eyes:     Extraocular Movements: Extraocular movements intact.  Pulmonary:     Effort: Pulmonary effort is normal.  Musculoskeletal:        General: Normal range of motion.     Cervical back: Normal range of motion.  Lymphadenopathy:     Cervical: No cervical adenopathy.  Skin:    Findings: Lesion present.         Comments: Area of 0.25cm melanocytic nevus, no surrounding erythema, tenderness, bleeding, discharge noted from the area  Neurological:     General: No focal deficit present.     Mental Status: She is alert and oriented to person, place, and time.  Psychiatric:        Mood and Affect: Mood normal.        Thought Content: Thought content normal.   Skin excision  Date/Time: 02/04/2024 12:48 PM  Performed by: Alvia Corean CROME, FNP Authorized by: Alvia Corean CROME, FNP   Number of  Lesions: 1 Lesion 1:    Body area: lower extremity   Lower extremity location: L buttock   Initial size (mm): 25   Malignancy: benign lesion     Destruction method: curettage     Closure complexity: simple     Repair comments: Verbal consent obtained 1mL lidocaine  without epi used for anesthesia Dermarazor used for shave biopsy and removal Mild bleeding, hemostasis achieved with silver nitrate chemical cautery and pressure Tolerated well RTC given    No results found for any visits on 02/04/24.      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 Procedure tolerated well. - Send excised tissue for pathology. - Provided infection and return precautions.     Return if symptoms worsen or fail to improve.  Corean CROME Alvia, FNP

## 2024-02-05 ENCOUNTER — Telehealth: Payer: Self-pay

## 2024-02-05 NOTE — Telephone Encounter (Signed)
 Copied from CRM [id number]. Topic: Clinical - Request for Lab/Test Order >> Feb 05, 2024  2:21 PM Laymon HERO wrote: Reason for CRM: Tully from [facility] [phone]  order was not released for surgical pathology - it is still saying future order

## 2024-02-07 ENCOUNTER — Other Ambulatory Visit: Payer: Self-pay | Admitting: Internal Medicine

## 2024-02-07 DIAGNOSIS — G43009 Migraine without aura, not intractable, without status migrainosus: Secondary | ICD-10-CM | POA: Insufficient documentation

## 2024-02-07 DIAGNOSIS — G43709 Chronic migraine without aura, not intractable, without status migrainosus: Secondary | ICD-10-CM | POA: Insufficient documentation

## 2024-02-07 DIAGNOSIS — J341 Cyst and mucocele of nose and nasal sinus: Secondary | ICD-10-CM | POA: Insufficient documentation

## 2024-02-07 MED ORDER — NURTEC 75 MG PO TBDP: 1.0000 | ORAL_TABLET | ORAL | 2 refills | Status: DC | PRN

## 2024-02-08 NOTE — Telephone Encounter (Unsigned)
 Copied from CRM [id number]. Topic: Clinical - Request for Lab/Test Order >> Feb 05, 2024  2:21 PM Laymon HERO wrote: Reason for CRM: Tully from [facility] [phone]  order was not released for surgical pathology - it is still saying future order >> Feb 08, 2024  3:07 PM [place] J wrote: Bari calling back to check for an update. The order for surgical pathology is still showing as future and it needs to state collected. Please call back once that is update:  [phone]

## 2024-02-10 ENCOUNTER — Encounter (HOSPITAL_BASED_OUTPATIENT_CLINIC_OR_DEPARTMENT_OTHER): Payer: Self-pay

## 2024-02-10 ENCOUNTER — Other Ambulatory Visit: Payer: Self-pay

## 2024-02-10 DIAGNOSIS — R202 Paresthesia of skin: Secondary | ICD-10-CM | POA: Insufficient documentation

## 2024-02-10 DIAGNOSIS — R519 Headache, unspecified: Secondary | ICD-10-CM | POA: Diagnosis present

## 2024-02-10 DIAGNOSIS — R112 Nausea with vomiting, unspecified: Secondary | ICD-10-CM | POA: Insufficient documentation

## 2024-02-10 MED ORDER — ONDANSETRON HCL 4 MG/2ML IJ SOLN
4.0000 mg | Freq: Once | INTRAMUSCULAR | Status: AC
  Administered 2024-02-10: 4 mg via INTRAVENOUS
  Filled 2024-02-10: qty 2

## 2024-02-10 NOTE — ED Triage Notes (Signed)
 Pt states migraine started at 1700 States she had a MRI last week and says they found a lesion and it is causing her migraines and elevated BP per pt +N/V

## 2024-02-11 ENCOUNTER — Emergency Department (HOSPITAL_BASED_OUTPATIENT_CLINIC_OR_DEPARTMENT_OTHER)
Admission: EM | Admit: 2024-02-11 | Discharge: 2024-02-11 | Disposition: A | Discharge status: Home / Self Care | Attending: Emergency Medicine | Admitting: Emergency Medicine

## 2024-02-11 DIAGNOSIS — R519 Headache, unspecified: Secondary | ICD-10-CM

## 2024-02-11 LAB — COMPREHENSIVE METABOLIC PANEL WITH GFR
ALT: 66 U/L — ABNORMAL HIGH (ref 0–44)
AST: 49 U/L — ABNORMAL HIGH (ref 15–41)
Albumin: 4.5 g/dL (ref 3.5–5.0)
Alkaline Phosphatase: 120 U/L (ref 38–126)
Anion gap: 12 (ref 5–15)
BUN: 9 mg/dL (ref 6–20)
CO2: 24 mmol/L (ref 22–32)
Calcium: 9.4 mg/dL (ref 8.9–10.3)
Chloride: 103 mmol/L (ref 98–111)
Creatinine, Ser: 0.71 mg/dL (ref 0.44–1.00)
GFR, Estimated: >60 mL/min (ref >60)
Glucose, Bld: 114 mg/dL — ABNORMAL HIGH (ref 70–99)
Potassium: 3.7 mmol/L (ref 3.5–5.1)
Sodium: 139 mmol/L (ref 135–145)
Total Bilirubin: 0.4 mg/dL (ref 0.0–1.2)
Total Protein: 7.7 g/dL (ref 6.5–8.1)

## 2024-02-11 LAB — CBC WITH DIFFERENTIAL/PLATELET
Abs Immature Granulocytes: 0.05 K/uL (ref 0.00–0.07)
Basophils Absolute: 0.1 K/uL (ref 0.0–0.1)
Basophils Relative: 0 %
Eosinophils Absolute: 0.2 K/uL (ref 0.0–0.5)
Eosinophils Relative: 2 %
HCT: 40.4 % (ref 36.0–46.0)
Hemoglobin: 13.8 g/dL (ref 12.0–15.0)
Immature Granulocytes: 0 %
Lymphocytes Relative: 29 %
Lymphs Abs: 3.4 K/uL (ref 0.7–4.0)
MCH: 29.7 pg (ref 26.0–34.0)
MCHC: 34.2 g/dL (ref 30.0–36.0)
MCV: 87.1 fL (ref 80.0–100.0)
Monocytes Absolute: 0.8 K/uL (ref 0.1–1.0)
Monocytes Relative: 6 %
Neutro Abs: 7.4 K/uL (ref 1.7–7.7)
Neutrophils Relative %: 63 %
Platelets: 288 K/uL (ref 150–400)
RBC: 4.64 MIL/uL (ref 3.87–5.11)
RDW: 14.9 % (ref 11.5–15.5)
WBC: 11.9 K/uL — ABNORMAL HIGH (ref 4.0–10.5)
nRBC: 0 % (ref 0.0–0.2)

## 2024-02-11 MED ORDER — SODIUM CHLORIDE 0.9 % IV BOLUS
500.0000 mL | Freq: Once | INTRAVENOUS | Status: AC
  Administered 2024-02-11: 500 mL via INTRAVENOUS

## 2024-02-11 MED ORDER — SODIUM CHLORIDE 0.9 % IV SOLN
INTRAVENOUS | Status: DC | PRN
  Administered 2024-02-11: 100 mL via INTRAVENOUS

## 2024-02-11 MED ORDER — DIPHENHYDRAMINE HCL 50 MG/ML IJ SOLN
25.0000 mg | Freq: Once | INTRAMUSCULAR | Status: AC
  Administered 2024-02-11: 25 mg via INTRAVENOUS
  Filled 2024-02-11: qty 1

## 2024-02-11 MED ORDER — MAGNESIUM SULFATE 2 GM/50ML IV SOLN
2.0000 g | Freq: Once | INTRAVENOUS | Status: AC
  Administered 2024-02-11: 2 g via INTRAVENOUS
  Filled 2024-02-11: qty 50

## 2024-02-11 MED ORDER — KETOROLAC TROMETHAMINE 15 MG/ML IJ SOLN
15.0000 mg | Freq: Once | INTRAMUSCULAR | Status: AC
  Administered 2024-02-11: 15 mg via INTRAVENOUS
  Filled 2024-02-11: qty 1

## 2024-02-11 MED ORDER — METOCLOPRAMIDE HCL 5 MG/ML IJ SOLN
10.0000 mg | Freq: Once | INTRAMUSCULAR | Status: AC
  Administered 2024-02-11: 10 mg via INTRAVENOUS
  Filled 2024-02-11: qty 2

## 2024-02-11 MED ORDER — DIAZEPAM 5 MG/ML IJ SOLN: 2.5000 mg | Freq: Once | INTRAMUSCULAR | Status: DC

## 2024-02-11 NOTE — ED Provider Notes (Signed)
 [facility] Provider Note   CSN: [id number] Arrival date & time: 02/10/24  2330     Patient presents with: Migraine   Alexandra Escobar is a 43 y.o. female.   The history is provided by the patient.  Migraine   Alexandra Escobar is a 43 y.o. female who presents to the Emergency Department complaining of migraine.  She presents to the emergency department for what she describes as a migraine headache that started this evening around 5 PM.  Pain is described as a pulsing in bilateral temples.  She has associated nausea and vomiting.  She has photophobia.  She also reports numbness and tingling to her left arm.  Overall her tingling is improving.  She states that these are normal symptoms for her migraines except for her headache is not worse than typical.  She does have a history of hypertension but her blood pressure medications were discontinued 2 weeks ago due to hypotension.  She checked her blood pressure at home with her headache and it was elevated.  She was prescribed Nurtec by her PCP, but has not been able to pick this up from the pharmacy yet for her headache.  She did take Tylenol , which did not help her symptoms.      Prior to Admission medications   Medication Sig Start Date End Date Taking? Authorizing Provider  levothyroxine  (SYNTHROID ) 50 MCG tablet Take 1 tablet (50 mcg total) by mouth daily before breakfast. Schedule an appt for further refills 01/12/24   Alvia Corean CROME, FNP  Rimegepant Sulfate (NURTEC) 75 MG TBDP Take 1 tablet (75 mg total) by mouth every other day as needed. 02/07/24   Joshua Merrilee CROME, MD  sertraline  (ZOLOFT ) 100 MG tablet Take 150 mg by mouth daily. 12/19/22   [provider]    Allergies: Patient has no known allergies.    Review of Systems  All other systems reviewed and are negative.   Updated Vital Signs BP 118/75 (BP Location: Right Arm)   Pulse 75   Temp 98.1 F (36.7 C) (Oral)   Resp 16    Ht 5' 2 (1.575 m)   Wt 97 kg   LMP 01/24/2024 (Exact Date)   SpO2 96%   BMI 39.11 kg/m   Physical Exam Vitals and nursing note reviewed.  Constitutional:      Appearance: She is well-developed.  HENT:     Head: Normocephalic and atraumatic.  Cardiovascular:     Rate and Rhythm: Normal rate and regular rhythm.     Heart sounds: No murmur heard. Pulmonary:     Effort: Pulmonary effort is normal. No respiratory distress.     Breath sounds: Normal breath sounds.  Abdominal:     Palpations: Abdomen is soft.     Tenderness: There is no abdominal tenderness. There is no guarding or rebound.  Musculoskeletal:        General: No tenderness.  Skin:    General: Skin is warm and dry.  Neurological:     Mental Status: She is alert and oriented to person, place, and time.     Comments: EOMI, no asymmetry of facial movements, 5/5 strength in all four extremities with sensation to light touch intact in all four extremities.    Psychiatric:        Behavior: Behavior normal.     (all labs ordered are listed, but only abnormal results are displayed) Labs Reviewed  COMPREHENSIVE METABOLIC PANEL WITH GFR - Abnormal; Notable  for the following components:      Result Value   Glucose, Bld 114 (*)    AST 49 (*)    ALT 66 (*)    All other components within normal limits  CBC WITH DIFFERENTIAL/PLATELET - Abnormal; Notable for the following components:   WBC 11.9 (*)    All other components within normal limits    EKG: None  Radiology: No results found.   Procedures   Medications Ordered in the ED  diazepam  (VALIUM ) injection 2.5 mg (2.5 mg Intravenous Not Given 02/11/24 0417)  0.9 %  sodium chloride  infusion (100 mLs Intravenous New Bag/Given 02/11/24 0414)  ondansetron  (ZOFRAN ) injection 4 mg (4 mg Intravenous Given 02/10/24 2356)  metoCLOPramide  (REGLAN ) injection 10 mg (10 mg Intravenous Given 02/11/24 0244)  diphenhydrAMINE  (BENADRYL ) injection 25 mg (25 mg Intravenous Given  02/11/24 0241)  sodium chloride  0.9 % bolus 500 mL (0 mLs Intravenous Stopped 02/11/24 0412)  ketorolac  (TORADOL ) 15 MG/ML injection 15 mg (15 mg Intravenous Given 02/11/24 0400)  magnesium  sulfate IVPB 2 g 50 mL (2 g Intravenous New Bag/Given 02/11/24 0410)                                    Medical Decision Making Amount and/or Complexity of Data Reviewed Labs: ordered.  Risk Prescription drug management.   Patient here for evaluation of headache, vomiting.  Headache is more intense than prior migraines but otherwise similar to prior migraines, including the paresthesias.  She is nontoxic-appearing on evaluation with no focal neurologic deficits.  She was significantly hypertensive at time of ED arrival, blood pressure did improve after management of her symptoms.  Headache resolved on recheck.  No recurrent vomiting.  CBC with mild leukocytosis-this is similar when compared to priors in the system.  CMP is with minimal elevation in her transaminases but no abdominal tenderness.  Current picture is not consistent with meningitis, subarachnoid hemorrhage, dural sinus thrombosis, cholecystitis, hypertensive urgency.  Paresthesias resolved after management of her headache.  Feel she is stable for discharge home with outpatient PCP follow-up and return precautions.     Final diagnoses:  Bad headache    ED Discharge Orders     None          Griselda Norris, MD 02/11/24 [phone]

## 2024-02-12 ENCOUNTER — Other Ambulatory Visit: Payer: Self-pay

## 2024-02-12 ENCOUNTER — Encounter: Payer: Self-pay | Admitting: Cardiology

## 2024-02-12 MED ORDER — VALSARTAN 40 MG PO TABS: 40.0000 mg | ORAL_TABLET | Freq: Every day | ORAL | 1 refills | Status: DC

## 2024-02-12 NOTE — Progress Notes (Signed)
 Spoke with Dr. Sheena. Valsartan  40 mg sent to preferred pharmacy.  Pt will continue to monitor her blood pressure and sent them after 2 weeks.

## 2024-02-12 NOTE — Telephone Encounter (Signed)
 This order has been updated per Tully

## 2024-02-12 NOTE — Telephone Encounter (Signed)
 Spoke with Dr. Sheena. Valsartan  40 mg sent to preferred pharmacy.  Pt will continue to monitor her blood pressure and sent them after 2 weeks.

## 2024-02-15 ENCOUNTER — Ambulatory Visit (INDEPENDENT_AMBULATORY_CARE_PROVIDER_SITE_OTHER): Admitting: Family Medicine

## 2024-02-15 ENCOUNTER — Encounter: Payer: Self-pay | Admitting: Family Medicine

## 2024-02-15 VITALS — BP 118/84 | HR 88 | Temp 97.8°F | Ht 62.0 in | Wt 217.0 lb

## 2024-02-15 DIAGNOSIS — K760 Fatty (change of) liver, not elsewhere classified: Secondary | ICD-10-CM | POA: Diagnosis not present

## 2024-02-15 DIAGNOSIS — J341 Cyst and mucocele of nose and nasal sinus: Secondary | ICD-10-CM | POA: Diagnosis not present

## 2024-02-15 DIAGNOSIS — G43809 Other migraine, not intractable, without status migrainosus: Secondary | ICD-10-CM | POA: Diagnosis not present

## 2024-02-15 LAB — SURGICAL PATHOLOGY

## 2024-02-15 NOTE — Patient Instructions (Addendum)
 Qulipta samples provided to help prevent migraine.  Nurtec samples given to help treat acute migraine  May take ibuprofen  and tylenol  together for headaches.  I have ordered an ultrasound of your liver. Someone will be reaching out to get you scheduled for this.  I have sent in a referral to neurology. Someone will be reaching out to get you scheduled.   Follow-up with me for new or worsening symptoms.

## 2024-02-15 NOTE — Progress Notes (Signed)
 Acute Office Visit  Subjective:     Patient ID: Alexandra Escobar, female    DOB: February 04, 1981, 43 y.o.   MRN: [id number]  Chief Complaint  Patient presents with   Hospitalization Follow-up    D/c from [place] 02/11/24, had a bad headache. States she is still having headaches. Reports the level of pain she was having was too bad to just be headache. Headaches started last Wednesday and started to increase throughout the day and vomited her dinner. Was seeing blurry. After that day she has maintained a minor headache on and off, has been taking nurtec which has helped some but still feeling pressure.       HPI  Discussed the use of AI scribe software for clinical note transcription with the patient, who gave verbal consent to proceed.  History of Present Illness Alexandra Escobar is a 43 year old female with hypertension who presents with severe headaches and blurred vision. She is accompanied by her eight-month-old child.  Cephalalgia and visual disturbance - Severe headaches with a sensation of pressure, primarily on the left side, now also affecting the right side - Vomiting provides temporary relief from the pressure, but the headache recurs - Blurred vision in both eyes - Symptoms began following significant stress - Nurtec provides temporary relief from pressure but not from the headache - No recent head injuries  Hypertension - Recent blood pressure measured at 174/110 mmHg during a hospital visit - Received magnesium  and Toradol  at the hospital for elevated blood pressure and headache - Has not taken blood pressure medication today due to concerns about drowsiness while caring for her eight-month-old child  Medication use and concerns - Currently taking levothyroxine  in the morning and blood pressure medication with sertraline  at night - Has not taken ibuprofen  for headaches but is considering it - Concerned about the impact of Tylenol  on her liver due to history of fatty liver  disease  Hepatic health - History of fatty liver disease - No abdominal pain or liver-related symptoms  Other relevant history - Not breastfeeding     ROS Per HPI      Objective:    BP 118/84   Pulse 88   Temp 97.8 F (36.6 C) (Temporal)   Ht 5' 2 (1.575 m)   Wt 217 lb (98.4 kg)   LMP 01/24/2024 (Exact Date)   SpO2 95%   BMI 39.69 kg/m    Physical Exam Vitals and nursing note reviewed.  Constitutional:      General: She is not in acute distress.    Appearance: Normal appearance.     Comments: Appears fatigued  HENT:     Head: Normocephalic and atraumatic.     Right Ear: External ear normal.     Left Ear: External ear normal.     Nose: Nose normal.     Mouth/Throat:     Mouth: Mucous membranes are moist.     Pharynx: Oropharynx is clear.  Eyes:     Extraocular Movements: Extraocular movements intact.     Pupils: Pupils are equal, round, and reactive to light.  Cardiovascular:     Rate and Rhythm: Normal rate and regular rhythm.     Pulses: Normal pulses.     Heart sounds: Normal heart sounds.  Pulmonary:     Effort: Pulmonary effort is normal. No respiratory distress.     Breath sounds: Normal breath sounds. No wheezing, rhonchi or rales.  Musculoskeletal:        General: Normal  range of motion.     Cervical back: Normal range of motion.     Right lower leg: No edema.     Left lower leg: No edema.  Lymphadenopathy:     Cervical: No cervical adenopathy.  Neurological:     General: No focal deficit present.     Mental Status: She is alert and oriented to person, place, and time.     Cranial Nerves: No cranial nerve deficit.     Sensory: No sensory deficit.     Motor: No weakness.     Coordination: Coordination normal.     Gait: Gait normal.     Deep Tendon Reflexes: Reflexes normal.  Psychiatric:        Mood and Affect: Mood normal.        Thought Content: Thought content normal.     No results found for any visits on 02/15/24.       Assessment & Plan:   Assessment and Plan Assessment & Plan Migraine with aura Severe headaches with nausea, vomiting, and blurred vision. Stress may contribute. Nurtec provides temporary relief. Avoid Toradol  due to anxiety. Concerned about drowsiness from medication. - Prescribed Nurtec for preventive use: one tablet every other day for a week, then as needed. - Advised trial of ibuprofen  for headache relief. - Referred to neurology for further evaluation. - Qulipta samples given  Hypertension Recent elevated blood pressure at 174/110, previously controlled. Stress is a factor. Magnesium  helped lower blood pressure. - Continue current antihypertensive medication regimen. - Monitor blood pressure regularly.  Fatty liver disease Fluctuating liver enzyme levels. Concerned about Tylenol 's impact on liver health. - Order liver ultrasound to assess current status of fatty liver.     Orders Placed This Encounter  Procedures   US  ABDOMEN LIMITED RUQ (LIVER/GB)    Standing Status:   Future    Expected Date:   02/15/2024    Expiration Date:   05/17/2024    Reason for Exam (SYMPTOM  OR DIAGNOSIS REQUIRED):   fatty liver    Preferred imaging location?:   [facility]   Ambulatory referral to Neurology    Referral Priority:   Emergency    Referral Type:   Consultation    Referral Reason:   Specialty Services Required    Requested Specialty:   Neurology    Number of Visits Requested:   1     No orders of the defined types were placed in this encounter.   Return if symptoms worsen or fail to improve.  Corean LITTIE Ku, FNP

## 2024-02-16 ENCOUNTER — Ambulatory Visit
Admission: RE | Admit: 2024-02-16 | Discharge: 2024-02-16 | Disposition: A | Discharge status: Home / Self Care | Source: Ambulatory Visit | Attending: Family Medicine | Admitting: Family Medicine

## 2024-02-16 DIAGNOSIS — K760 Fatty (change of) liver, not elsewhere classified: Secondary | ICD-10-CM

## 2024-02-17 ENCOUNTER — Ambulatory Visit (INDEPENDENT_AMBULATORY_CARE_PROVIDER_SITE_OTHER): Admitting: Neurology

## 2024-02-17 ENCOUNTER — Encounter: Payer: Self-pay | Admitting: Neurology

## 2024-02-17 VITALS — BP 136/74 | Ht 62.0 in | Wt 218.0 lb

## 2024-02-17 DIAGNOSIS — F419 Anxiety disorder, unspecified: Secondary | ICD-10-CM | POA: Diagnosis not present

## 2024-02-17 DIAGNOSIS — G43709 Chronic migraine without aura, not intractable, without status migrainosus: Secondary | ICD-10-CM | POA: Diagnosis not present

## 2024-02-17 MED ORDER — SUMATRIPTAN SUCCINATE 100 MG PO TABS: 100.0000 mg | ORAL_TABLET | Freq: Once | ORAL | 6 refills | Status: DC | PRN

## 2024-02-17 MED ORDER — ONDANSETRON 4 MG PO TBDP: 4.0000 mg | ORAL_TABLET | Freq: Three times a day (TID) | ORAL | 6 refills | Status: DC | PRN

## 2024-02-17 MED ORDER — TOPIRAMATE 50 MG PO TABS: 100.0000 mg | ORAL_TABLET | Freq: Every day | ORAL | 11 refills | Status: DC
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
## 2024-02-18 ENCOUNTER — Telehealth: Payer: Self-pay

## 2024-02-18 NOTE — Telephone Encounter (Signed)
 Copied from CRM [id number]. Topic: Clinical - Medical Advice >> Feb 18, 2024  3:35 PM Franky GRADE wrote: Reason for CRM: Patient was on 02/17/2024 with the neurologist; however, patient did not have an experience. She wants a second opinion from a different neurologist.

## 2024-02-19 ENCOUNTER — Telehealth: Payer: Self-pay

## 2024-02-19 DIAGNOSIS — K8020 Calculus of gallbladder without cholecystitis without obstruction: Secondary | ICD-10-CM

## 2024-02-19 NOTE — Telephone Encounter (Signed)
Spoke with patient, gave a verbal understanding °

## 2024-02-19 NOTE — Telephone Encounter (Signed)
 Please advise for me.

## 2024-02-19 NOTE — Telephone Encounter (Signed)
 Copied from CRM [id number]. Topic: General - Other >> Feb 19, 2024  2:06 PM Aisha D wrote: Reason for CRM: Doyal with [facility] is calling to confirm if the office has received the call report for the US  Abdomen Limited scan. No one in office was available to speak with Doyal to confirm if its in the chart. Doyal stated that she only needed a yes or no to confirm its in chart. I confirmed that the call report is listed in the pt's chart as of today. Doyal would like to make sure that the provider on the report Corean Alvia BRISTLE, is also made aware that its listed.

## 2024-02-22 NOTE — Telephone Encounter (Signed)
Can you handle this for me? 

## 2024-02-22 NOTE — Telephone Encounter (Signed)
 Spoke with patient, advised her to reach out to [facility] to possibly see a different provider in their office since she is already established there. She states the provider she seen did not even review her MRI, so she wants another opinion. Advised patient to call us  back if [facility] does not allow her to switch providers and we can refer her to [facility], she gave a verbal understanding.

## 2024-02-23 ENCOUNTER — Other Ambulatory Visit: Payer: Self-pay

## 2024-02-23 ENCOUNTER — Ambulatory Visit: Payer: Self-pay | Admitting: Family Medicine

## 2024-02-23 ENCOUNTER — Emergency Department (HOSPITAL_BASED_OUTPATIENT_CLINIC_OR_DEPARTMENT_OTHER)

## 2024-02-23 ENCOUNTER — Emergency Department (HOSPITAL_BASED_OUTPATIENT_CLINIC_OR_DEPARTMENT_OTHER)
Admission: EM | Admit: 2024-02-23 | Discharge: 2024-02-23 | Discharge status: Against Medical Advice | Attending: Emergency Medicine | Admitting: Emergency Medicine

## 2024-02-23 DIAGNOSIS — R110 Nausea: Secondary | ICD-10-CM | POA: Insufficient documentation

## 2024-02-23 DIAGNOSIS — R0789 Other chest pain: Secondary | ICD-10-CM | POA: Diagnosis present

## 2024-02-23 DIAGNOSIS — R0602 Shortness of breath: Secondary | ICD-10-CM | POA: Insufficient documentation

## 2024-02-23 DIAGNOSIS — Z5321 Procedure and treatment not carried out due to patient leaving prior to being seen by health care provider: Secondary | ICD-10-CM | POA: Diagnosis not present

## 2024-02-23 LAB — CBC
HCT: 39.4 % (ref 36.0–46.0)
Hemoglobin: 13.4 g/dL (ref 12.0–15.0)
MCH: 30 pg (ref 26.0–34.0)
MCHC: 34 g/dL (ref 30.0–36.0)
MCV: 88.1 fL (ref 80.0–100.0)
Platelets: 275 K/uL (ref 150–400)
RBC: 4.47 MIL/uL (ref 3.87–5.11)
RDW: 15 % (ref 11.5–15.5)
WBC: 13.4 K/uL — ABNORMAL HIGH (ref 4.0–10.5)
nRBC: 0 % (ref 0.0–0.2)

## 2024-02-23 LAB — BASIC METABOLIC PANEL WITH GFR
Anion gap: 12 (ref 5–15)
BUN: 10 mg/dL (ref 6–20)
CO2: 24 mmol/L (ref 22–32)
Calcium: 9.4 mg/dL (ref 8.9–10.3)
Chloride: 102 mmol/L (ref 98–111)
Creatinine, Ser: 0.62 mg/dL (ref 0.44–1.00)
GFR, Estimated: >60 mL/min (ref >60)
Glucose, Bld: 93 mg/dL (ref 70–99)
Potassium: 3.9 mmol/L (ref 3.5–5.1)
Sodium: 138 mmol/L (ref 135–145)

## 2024-02-23 LAB — TROPONIN T, HIGH SENSITIVITY: Troponin T High Sensitivity: <15 ng/L (ref <19)

## 2024-02-23 NOTE — ED Notes (Signed)
Pt left per registration 

## 2024-02-23 NOTE — ED Notes (Signed)
 Pt requested removal of IV because it was hurting her. Pt informed possible need to re-stick for IV and possible blood draw. Pt verbalized understanding. IV removed at this time. No swelling nor bruising noted.

## 2024-02-23 NOTE — ED Triage Notes (Signed)
 Pt reports concern for elevated at home BP of 165/120. C/o left sided chest pressure that started yesterday around 10 pm while laying day. Pain has been constant for past hour. Pain associated nausea, numbness to left arm, and SOB.

## 2024-02-23 NOTE — ED Notes (Signed)
 Called for room 6 no answer

## 2024-02-24 ENCOUNTER — Encounter: Payer: Self-pay | Admitting: Cardiology

## 2024-02-25 ENCOUNTER — Other Ambulatory Visit (INDEPENDENT_AMBULATORY_CARE_PROVIDER_SITE_OTHER)

## 2024-02-25 ENCOUNTER — Encounter: Payer: Self-pay | Admitting: Gastroenterology

## 2024-02-25 ENCOUNTER — Other Ambulatory Visit: Payer: Self-pay

## 2024-02-25 ENCOUNTER — Ambulatory Visit (INDEPENDENT_AMBULATORY_CARE_PROVIDER_SITE_OTHER): Admitting: Gastroenterology

## 2024-02-25 ENCOUNTER — Telehealth: Payer: Self-pay | Admitting: Internal Medicine

## 2024-02-25 VITALS — BP 130/90 | HR 75 | Ht 61.42 in | Wt 218.0 lb

## 2024-02-25 DIAGNOSIS — R7989 Other specified abnormal findings of blood chemistry: Secondary | ICD-10-CM | POA: Diagnosis not present

## 2024-02-25 DIAGNOSIS — R12 Heartburn: Secondary | ICD-10-CM

## 2024-02-25 DIAGNOSIS — K8020 Calculus of gallbladder without cholecystitis without obstruction: Secondary | ICD-10-CM | POA: Diagnosis not present

## 2024-02-25 DIAGNOSIS — Z860100 Personal history of colon polyps, unspecified: Secondary | ICD-10-CM

## 2024-02-25 DIAGNOSIS — R112 Nausea with vomiting, unspecified: Secondary | ICD-10-CM

## 2024-02-25 DIAGNOSIS — K760 Fatty (change of) liver, not elsewhere classified: Secondary | ICD-10-CM

## 2024-02-25 DIAGNOSIS — R194 Change in bowel habit: Secondary | ICD-10-CM | POA: Diagnosis not present

## 2024-02-25 DIAGNOSIS — G43809 Other migraine, not intractable, without status migrainosus: Secondary | ICD-10-CM

## 2024-02-25 DIAGNOSIS — R151 Fecal smearing: Secondary | ICD-10-CM

## 2024-02-25 LAB — HEPATIC FUNCTION PANEL
ALT: 57 U/L — ABNORMAL HIGH (ref 0–35)
AST: 33 U/L (ref 0–37)
Albumin: 4.3 g/dL (ref 3.5–5.2)
Alkaline Phosphatase: 103 U/L (ref 39–117)
Bilirubin, Direct: 0 mg/dL (ref 0.0–0.3)
Total Bilirubin: 0.3 mg/dL (ref 0.2–1.2)
Total Protein: 7.4 g/dL (ref 6.0–8.3)

## 2024-02-25 LAB — IBC + FERRITIN
Ferritin: 71.8 ng/mL (ref 10.0–291.0)
Iron: 103 ug/dL (ref 42–145)
Saturation Ratios: 29.3 % (ref 20.0–50.0)
TIBC: 351.4 ug/dL (ref 250.0–450.0)
Transferrin: 251 mg/dL (ref 212.0–360.0)

## 2024-02-25 LAB — PROTIME-INR
INR: 1 ratio (ref 0.8–1.0)
Prothrombin Time: 10.5 s (ref 9.6–13.1)

## 2024-02-25 NOTE — Progress Notes (Signed)
 I agree with the assessment and plan as outlined by Ms. May. Since patient last had a normal colonoscopy in 2019, I think it would be reasonable to do her next colonoscopy in 2029 for colon cancer screening.

## 2024-02-25 NOTE — Telephone Encounter (Signed)
 Copied from CRM [id number]. Topic: Referral - Question >> Feb 25, 2024  8:01 AM Laymon HERO wrote: Reason for CRM: patient asking for a referral to be sent to [facility]

## 2024-02-25 NOTE — Telephone Encounter (Signed)
**Note De-identified  [place] Obfuscation** Please advise 

## 2024-02-25 NOTE — Telephone Encounter (Signed)
 Attempted to reach patient, no voicemail available. New referral placed, however [facility] still may not see patient due to her being established with [facility] already

## 2024-02-25 NOTE — Patient Instructions (Addendum)
 GERD, altered taste/bitter taste -GERD diet, no late meals 3-4 hours before lying down -weight loss with reduce reflux symptoms -Can use OTC Pepcid  prn as needed   Stool leakage -Start over the counter psyllium husk 1 tsp po daily   Fatty liver  We will collect lab work to rule out other causes Continue working with nutritionist Continue with weight loss Avoid alcohol  Your provider has requested that you go to the basement level for lab work before leaving today. Press B on the elevator. The lab is located at the first door on the left as you exit the elevator.  _______________________________________________________  If your blood pressure at your visit was 140/90 or greater, please contact your primary care physician to follow up on this.  _______________________________________________________  If you are age 23 or older, your body mass index should be between 23-30. Your Body mass index is 40.63 kg/m. If this is out of the aforementioned range listed, please consider follow up with your Primary Care Provider.  If you are age 37 or younger, your body mass index should be between 19-25. Your Body mass index is 40.63 kg/m. If this is out of the aformentioned range listed, please consider follow up with your Primary Care Provider.   ________________________________________________________  The [facility] providers would like to encourage you to use MYCHART to communicate with providers for non-urgent requests or questions.  Due to long hold times on the telephone, sending your provider a message by [facility] may be a faster and more efficient way to get a response.  Please allow 48 business hours for a response.  Please remember that this is for non-urgent requests.  _______________________________________________________  [facility] is using a team-based approach to care.  Your team is made up of your doctor and two to three APPS. Our APPS (Nurse Practitioners and  Physician Assistants) work with your physician to ensure care continuity for you. They are fully qualified to address your health concerns and develop a treatment plan. They communicate directly with your gastroenterologist to care for you. Seeing the Advanced Practice Practitioners on your physician's team can help you by facilitating care more promptly, often allowing for earlier appointments, access to diagnostic testing, procedures, and other specialty referrals.   Thank you for trusting me with your gastrointestinal care. Deanna May, FNP-C

## 2024-02-25 NOTE — Progress Notes (Signed)
 Chief Complaint:Calculus of gallbladder without cholecystitis without obstruction  Primary GI Doctor: Dr. Federico   HPI:  Patient is a 43 year old female patient with past medical history of hypertension, hypothyroidism, and fatty liver, who was referred to me by Joshua Charnita CROME, MD on 02/19/24 for a evaluation of Calculus of gallbladder without cholecystitis without obstruction  .   Presented to ED February 11, 2024 for prolonged headaches, MRI of the brain showed no significant abnormality.   Interval History    Patient presented for evaluation of hepatic steatosis and cholelithiasis. She notes at [facility] back in 2022 she was told she had fatty liver. She has been seen by nutritionist who is helping her with dietary modifications.  Patient did just recently give birth to a little girl on June 18, 2023.  She tells me she gained over 80 pounds throughout her pregnancy.  Patient states her normal weight is 150-160 lbs. patient denies any history of hepatitis, autoimmune disease.  Patient states the only new medication she has started within the past year is Nurtec for migraines which she has taken on 2 occasions.  She also has had recent issues with high blood pressure and headaches and started on Topamax  this week. No alcohol use, she stopped drinking two years. No tobacco use.  She reports only herbal supplements you are taking more recommended by the nutritionist as of recent.      Patient reports over the past week she has had diarrhea after most meals. She is on her menstrual cycle and feels this Nachman Sundt be a factor. No blood in stool.  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 She has had issues with stool leakage and having to wipe multiple times to get clean.  Patient reports she has right upper quadrant abdominal discomfort only when palpated.  No pain with eating.  Patient does report she has intermittent issues with heartburn  and sour taste in her mouth.  She admits she sometimes will eat late in the evening.  She reports symptoms occur less than twice per week.  Patient does not currently take any antiacids.  No dysphagia.  Patient reports she has intermittent nausea with vomiting that are associated with her migraines.  No NSAID's.   Surgical history: laparoscopy for endometriosis, hemorrhoidectomy- punctured bowel causing fistula  First colonoscopy in early 20's having GI issues, and she was told she had multiple polyps that were benign, recall every 5 years. No polyps on second or third colonoscopy. She has had approximately 3 done (2 in [place] , 1 in [place]). Last colonoscopy was 2019. Cannot recall name of facility for records.  No family history of GI issues or polyps.   Wt Readings from Last 3 Encounters:  02/25/24 218 lb (98.9 kg)  02/23/24 218 lb (98.9 kg)  02/17/24 218 lb (98.9 kg)    Past Medical History:  Diagnosis Date   Allergy Seasonal   Anal fissure 2009   Anemia    In the past   Anxiety    Chronic kidney disease    Fatty liver   Colon polyps 2019   Depression    Endometriosis    Fatty liver    GERD (gastroesophageal reflux disease)    Hypertension    Hypothyroidism    Obesity    PCOS (polycystic ovarian syndrome)    Thyroid  disease 2022    Past Surgical History:  Procedure Laterality Date   CESAREAN SECTION N/A  06/18/2023   Procedure: CESAREAN SECTION;  Surgeon: Curlene Agent, MD;  Location: [facility]S;  Service: Obstetrics;  Laterality: N/A;   COLON SURGERY     Fistula   DILATION AND CURETTAGE OF UTERUS  06/2021   HEMATOMA EVACUATION N/A 06/24/2023   Procedure: EVACUATION HEMATOMA INCISIONAL;  Surgeon: Marne Kelly Nest, MD;  Location: [facility] OR;  Service: Gynecology;  Laterality: N/A;   LAPAROSCOPY     for endometriosis   NASAL SEPTUM SURGERY     OTHER SURGICAL HISTORY     fibroid removal   OTHER SURGICAL HISTORY     fistula drainage    Current Outpatient  Medications  Medication Sig Dispense Refill   levothyroxine  (SYNTHROID ) 50 MCG tablet Take 1 tablet (50 mcg total) by mouth daily before breakfast. Schedule an appt for further refills 90 tablet 1   ondansetron  (ZOFRAN -ODT) 4 MG disintegrating tablet Take 1 tablet (4 mg total) by mouth every 8 (eight) hours as needed. 20 tablet 6   Rimegepant Sulfate (NURTEC) 75 MG TBDP Take 1 tablet (75 mg total) by mouth every other day as needed. 8 tablet 2   sertraline  (ZOLOFT ) 100 MG tablet Take 150 mg by mouth daily.     SUMAtriptan  (IMITREX ) 100 MG tablet Take 1 tablet (100 mg total) by mouth once as needed for up to 1 dose for migraine. Iridiana Fonner repeat in 2 hours if headache persists or recurs. 10 tablet 6   topiramate  (TOPAMAX ) 50 MG tablet Take 2 tablets (100 mg total) by mouth at bedtime. 60 tablet 11   valsartan  (DIOVAN ) 40 MG tablet Take 1 tablet (40 mg total) by mouth daily. 90 tablet 1   No current facility-administered medications for this visit.    Allergies as of 02/25/2024 - Review Complete 02/25/2024  Allergen Reaction Noted   Toradol  [ketorolac  tromethamine ] Anxiety 02/15/2024    Family History  Problem Relation Age of Onset   Hypertension Father    Depression Father    Anxiety disorder Father    Hypertension Sister    Cancer Maternal Grandmother        breast   Hypertension Paternal Grandmother     Review of Systems:    Constitutional: No weight loss, fever, chills, weakness or fatigue HEENT: Eyes: No change in vision               Ears, Nose, Throat:  No change in hearing or congestion Skin: No rash or itching Cardiovascular: No chest pain, chest pressure or palpitations   Respiratory: No SOB or cough Gastrointestinal: See HPI and otherwise negative Genitourinary: No dysuria or change in urinary frequency Neurological: No headache, dizziness or syncope Musculoskeletal: No new muscle or joint pain Hematologic: No bleeding or bruising Psychiatric: No history of depression or  anxiety    Physical Exam:  Vital signs: BP (!) 130/90 (BP Location: Right Arm, Patient Position: Sitting, Cuff Size: Normal)   Pulse 75   Ht 5' 1.42 (1.56 m) Comment: height without shoes  Wt 218 lb (98.9 kg)   LMP 01/24/2024 (Exact Date)   BMI 40.63 kg/m   Constitutional:   Pleasant  female appears to be in NAD, Well developed, Well nourished, alert and cooperative Throat: Oral cavity and pharynx without inflammation, swelling or lesion.  Respiratory: Respirations even and unlabored. Lungs clear to auscultation bilaterally.   No wheezes, crackles, or rhonchi.  Cardiovascular: Normal S1, S2. Regular rate and rhythm. No peripheral edema, cyanosis or pallor.  Gastrointestinal:  Soft, nondistended, nontender. No rebound  or guarding. Normal bowel sounds. No appreciable masses or hepatomegaly. Rectal:  Not performed.  Msk:  Symmetrical without gross deformities. Without edema, no deformity or joint abnormality.  Neurologic:  Alert and  oriented x4;  grossly normal neurologically.  Skin:   Dry and intact without significant lesions or rashes.  RELEVANT LABS AND IMAGING: CBC    Latest Ref Rng & Units 02/23/2024    2:20 AM 02/10/2024   11:52 PM 01/11/2024    2:56 PM  CBC  WBC 4.0 - 10.5 K/uL 13.4  11.9  8.7   Hemoglobin 12.0 - 15.0 g/dL 86.5  86.1  86.7   Hematocrit 36.0 - 46.0 % 39.4  40.4  39.8   Platelets 150 - 400 K/uL 275  288  266.0      CMP     Latest Ref Rng & Units 02/23/2024    2:20 AM 02/10/2024   11:52 PM 01/27/2024   11:21 AM  CMP  Glucose 70 - 99 mg/dL 93  885  96   BUN 6 - 20 mg/dL 10  9  9    Creatinine 0.44 - 1.00 mg/dL 9.37  9.28  9.44   Sodium 135 - 145 mmol/L 138  139  138   Potassium 3.5 - 5.1 mmol/L 3.9  3.7  3.7   Chloride 98 - 111 mmol/L 102  103  106   CO2 22 - 32 mmol/L 24  24  26    Calcium  8.9 - 10.3 mg/dL 9.4  9.4  8.8   Total Protein 6.5 - 8.1 g/dL  7.7    Total Bilirubin 0.0 - 1.2 mg/dL  0.4    Alkaline Phos 38 - 126 U/L  120    AST 15 - 41 U/L  49     ALT 0 - 44 U/L  66       Lab Results  Component Value Date   TSH 1.06 01/11/2024  02/16/24 US  abd limited RUQ IMPRESSION: 1. Cholelithiasis with a possible sonographic Murphy sign. No wall thickening or pericholecystic fluid. Findings are equivocal for acute cholecystitis. If there is clinical concern for acute cholecystitis, consider further evaluation with HIDA scan. 2. Hepatic steatosis.  12/24 CTAP IMPRESSION: 1. No definite explanation for patient's postoperative fever. 2. Expected appearance of the uterus following recent cesarean section. No intra-abdominal or pelvic definable/drainable fluid collections. 3. Expected ill-defined stranding and scattered foci of subcutaneous emphysema are noted about the ventral lower abdominal/pelvic wall incision site without definable/drainable fluid collection. 4. Moderate-to-large stool burden, primarily within the cecum and ascending colon, without evidence of enteric obstruction.   Assessment: Encounter Diagnoses  Name Primary?   Hepatic steatosis Yes   Elevated LFTs    Calculus of gallbladder without cholecystitis without obstruction    Altered bowel habits    Fecal smearing    History of colonic polyps    Heartburn    Waterbrash    Nausea and vomiting, unspecified vomiting type       43 year old female patient who presents for evaluation of fatty liver, patient reports he initially was diagnosed back in 2022 but no further workup was completed.  Patient did just recently go through pregnancy with 80 pound weight gain which could contribute to the fatty liver. Will go ahead and do full liver panel to rule out hepatitis, autoimmune, and/or genetic disorders.  Patient is working with dietitian to help with weight loss. 02/16/2024 US  abdomen RUQ shows Hepatic steatosis. CTAP from December 2024 was normal. AST 49, ALT 66,  previously normal 2 months ago. Total Bilirubin 0.4, PLT 275.     Incidental finding of Cholelithiasis on recent  abdominal ultrasound.  Patient has reproducible right upper quadrant abdominal discomfort.  Her altered bowel habits seem to be more consistent with hormonal changes and/or trauma from previous hemorrhoidectomy.  Recommend patient start psyllium husk 1 teaspoon p.o. daily to see if this helps.  Patient's nausea is associated with her migraines which she is seeing neurology for. patient has some issues with intermittent reflux less than twice a week recommended she follow reflux diet and avoid eating late at night along with using over-the-counter Pepcid  as needed.  Not convinced that the symptoms are related to her gallbladder however will refer to general surgeon for consultation and evaluation.     Patient reports history of colonic polyps in her 21s but states the last 2 colonoscopies have been normal.  No family history.  Will defer procedures to Dr. Federico   Plan: - Recommend GERD diet, no late meals -Can use OTC Pepcid  prn  - Start psyllium husk 1 tsp po daily  - [facility] surgery referral for cholelithiasis  - check ANA, AMA, Anti-smooth muscle antibody, Hepatitis A IgG, Hepatitis B surface antigen, Hepatitis B surface antibody, HCV antibody, ferritin, TIBC,  Alpha 1 antitrypsin, ceruloplasmin, tTG, total IgA, PT/INR, IgG -continue to see nutritionist for fatty liver -defer colonoscopy to Dr. Federico -follow-up 3-4 mths with APP  Thank you for the courtesy of this consult. Please call me with any questions or concerns.   Annayah Worthley, FNP-C [facility] 02/25/2024, 10:59 AM  Cc: Joshua Shailyn CROME, MD

## 2024-02-26 ENCOUNTER — Ambulatory Visit: Attending: Cardiology | Admitting: Cardiology

## 2024-02-26 ENCOUNTER — Encounter: Payer: Self-pay | Admitting: Cardiology

## 2024-02-26 VITALS — BP 130/100 | HR 81 | Ht 61.0 in | Wt 218.0 lb

## 2024-02-26 DIAGNOSIS — I10 Essential (primary) hypertension: Secondary | ICD-10-CM | POA: Diagnosis present

## 2024-02-26 MED ORDER — VALSARTAN 80 MG PO TABS: 80.0000 mg | ORAL_TABLET | Freq: Every day | ORAL | 3 refills | Status: AC

## 2024-02-26 MED ORDER — PROPRANOLOL HCL 60 MG PO TABS: 60.0000 mg | ORAL_TABLET | Freq: Every evening | ORAL | 3 refills | Status: DC

## 2024-02-26 NOTE — Patient Instructions (Signed)
 Medication Instructions:  Your physician has recommended you make the following change in your medication:  START: Propranolol  60 mg nightly START: Valsartan  80 mg once daily *If you need a refill on your cardiac medications before your next appointment, please call your pharmacy*   Follow-Up: At [facility], you and your health needs are our priority.  As part of our continuing mission to provide you with exceptional heart care, our providers are all part of one team.  This team includes your primary Cardiologist (physician) and Advanced Practice Providers or APPs (Physician Assistants and Nurse Practitioners) who all work together to provide you with the care you need, when you need it.  Your next appointment:    16 week   Provider:   Kardie Tobb, DO

## 2024-02-26 NOTE — Progress Notes (Signed)
 Cardio-Obstetrics Clinic  Follow Up Note   Date:  02/26/2024   ID:  Alexandra Escobar, DOB 04/15/1981, MRN [id number]  PCP:  Joshua Telena CROME, MD   [facility] Providers Cardiologist:  Dub Huntsman, DO  Electrophysiologist:  None        Referring MD: Joshua Mansi CROME, MD   Chief Complaint:  I am ok   History of Present Illness:    Alexandra Escobar is a 43 y.o. female [G3P0121] who returns for follow up of  chronic hypertension with superimposed preeclampsia with severe features at [redacted]w[redacted]d., underwent induction due to preeclampsia and status post c-section, hypothyroidism, IVF pregnancy.   During her postpartum stay she went into heart failure exacerbation which I suspect was high output heart failure due to anemia. She was diuresed.  Her hospital stay postpartum was also complicated on POD #3 by incisional drainage and on POD#4 a wound hematoma was noted and drained with bedside pressure.  Packing was placed. She underwent packing changes but determined that she would benefit from opening of the incision, removal of hematoma.  On 06/24/2023 this was completed and the wound was closed.   On 06/25/2023, a low grade fever was noted and empiric IV antibiotics were begun.  CTAP was reassuring, as was CXR.  Her condition continued to improve and after 24 hours without a fever, she was transitioned to oral augmentin .  Dressing changes were done in order to continue wound assessment.   She was seen by me on 06/2024 at that time she was experiencing chest pain and I thought this was inflammatory - she was given Ibuprofen . This did not helped and chest pain continued eventually CCTA was ordered which did show any evidence of coronary artery disease.   At her visit with me on 01/29/2024 her antihypertensive medications have been stopped by her PCP due to low blood pressure and lightheadedness. She was pending neurological workup with MRI. She had no  specific cardiac complaints.   In the interim she was  treated for migraines. She called my office and reported elevated blood pressure. I restarted her valsartan . Recently her systolic blood pressure was up to 160 mmHg.    Prior CV Studies Reviewed: The following studies were reviewed today: Echo   Past Medical History:  Diagnosis Date   Allergy Seasonal   Anal fissure 2009   Anemia    In the past   Anxiety    Chronic kidney disease    Fatty liver   Colon polyps 2019   Depression    Endometriosis    Fatty liver    GERD (gastroesophageal reflux disease)    Hypertension    Hypothyroidism    Obesity    PCOS (polycystic ovarian syndrome)    Thyroid  disease 2022    Past Surgical History:  Procedure Laterality Date   CESAREAN SECTION N/A 06/18/2023   Procedure: CESAREAN SECTION;  Surgeon: Curlene Agent, MD;  Location: [facility] LD ORS;  Service: Obstetrics;  Laterality: N/A;   COLON SURGERY     Fistula   DILATION AND CURETTAGE OF UTERUS  06/2021   HEMATOMA EVACUATION N/A 06/24/2023   Procedure: EVACUATION HEMATOMA INCISIONAL;  Surgeon: Marne Kelly Nest, MD;  Location: [facility] OR;  Service: Gynecology;  Laterality: N/A;   LAPAROSCOPY     for endometriosis   NASAL SEPTUM SURGERY     OTHER SURGICAL HISTORY     fibroid removal   OTHER SURGICAL HISTORY     fistula drainage  OB History     Gravida  3   Para  1   Term      Preterm  1   AB  2   Living  1      SAB  2   IAB      Ectopic      Multiple  0   Live Births  1               Current Medications: Current Meds  Medication Sig   levothyroxine  (SYNTHROID ) 50 MCG tablet Take 1 tablet (50 mcg total) by mouth daily before breakfast. Schedule an appt for further refills   propranolol  (INDERAL ) 60 MG tablet Take 1 tablet (60 mg total) by mouth at bedtime.   Rimegepant Sulfate (NURTEC) 75 MG TBDP Take 1 tablet (75 mg total) by mouth every other day as needed.   sertraline  (ZOLOFT ) 100 MG tablet Take 150 mg by mouth daily.   SUMAtriptan  (IMITREX ) 100 MG  tablet Take 1 tablet (100 mg total) by mouth once as needed for up to 1 dose for migraine. May repeat in 2 hours if headache persists or recurs.   valsartan  (DIOVAN ) 80 MG tablet Take 1 tablet (80 mg total) by mouth daily.   [DISCONTINUED] valsartan  (DIOVAN ) 40 MG tablet Take 1 tablet (40 mg total) by mouth daily.     Allergies:   Toradol  [ketorolac  tromethamine ]   Social History   Socioeconomic History   Marital status: Married    Spouse name: Not on file   Number of children: Not on file   Years of education: Not on file   Highest education level: Some college, no degree  Occupational History   Not on file  Tobacco Use   Smoking status: Never    Passive exposure: Never   Smokeless tobacco: Never  Vaping Use   Vaping status: Never Used  Substance and Sexual Activity   Alcohol use: Never   Drug use: Never   Sexual activity: Not Currently    Partners: Male    Birth control/protection: None  Other Topics Concern   Not on file  Social History Narrative   Right handed   Caffeine -1 cup    Works from home, 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, computer work   Social Drivers of Corporate investment banker Strain: Patient Declined (01/31/2024)   Overall Financial Resource Strain (CARDIA)    Difficulty of Paying Living Expenses: Patient declined  Food Insecurity: Patient Declined (01/31/2024)   Hunger Vital Sign    Worried About Running Out of Food in the Last Year: Patient declined    Ran Out of Food in the Last Year: Patient declined  Transportation Needs: No Transportation Needs (01/31/2024)   PRAPARE - Administrator, Civil Service (Medical): No    Lack of Transportation (Non-Medical): No  Physical Activity: Inactive (01/31/2024)   Exercise Vital Sign    Days of Exercise per Week: 0 days    Minutes of Exercise per Session: Not on file  Stress: Stress Concern Present (01/31/2024)   [employer] of Occupational Health - Occupational Stress Questionnaire    Feeling of Stress:  Very much  Social Connections: Socially Isolated (01/31/2024)   Social Connection and Isolation Panel    Frequency of Communication with Friends and Family: Once a week    Frequency of Social Gatherings with Friends and Family: Once a week    Attends Religious Services: 1 to 4 times per year    Active Member of [employer]  or Organizations: No    Attends Engineer, structural: Not on file    Marital Status: Separated      Family History  Problem Relation Age of Onset   Hypertension Father    Depression Father    Anxiety disorder Father    Hypertension Sister    Cancer Maternal Grandmother        breast   Hypertension Paternal Grandmother       ROS:   Please see the history of present illness.     All other systems reviewed and are negative.   Labs/EKG Reviewed:    EKG:   EKG was no  ordered today.    Recent Labs: 06/04/2023: B Natriuretic Peptide 52.4 06/19/2023: Magnesium  3.9 01/11/2024: TSH 1.06 02/23/2024: BUN 10; Creatinine, Ser 0.62; Hemoglobin 13.4; Platelets 275; Potassium 3.9; Sodium 138 02/25/2024: ALT 57   Recent Lipid Panel No results found for: CHOL, TRIG, HDL, CHOLHDL, LDLCALC, LDLDIRECT  Physical Exam:    VS:  BP (!) 130/100 (BP Location: Left Arm, Patient Position: Sitting, Cuff Size: Normal)   Pulse 81   Ht 5' 1 (1.549 m)   Wt 218 lb (98.9 kg)   LMP 01/24/2024 (Exact Date)   SpO2 96%   BMI 41.19 kg/m     Wt Readings from Last 3 Encounters:  02/26/24 218 lb (98.9 kg)  02/25/24 218 lb (98.9 kg)  02/23/24 218 lb (98.9 kg)     GEN:  Well nourished, well developed in no acute distress HEENT: Normal NECK: No JVD; No carotid bruits LYMPHATICS: No lymphadenopathy CARDIAC: RRR, no murmurs, rubs, gallops RESPIRATORY:  Clear to auscultation without rales, wheezing or rhonchi  ABDOMEN: Soft, non-tender, non-distended MUSCULOSKELETAL:  No edema; No deformity  SKIN: Warm and dry NEUROLOGIC:  Alert and oriented x 3 PSYCHIATRIC:  Normal  affect    Risk Assessment/Risk Calculators:                 ASSESSMENT & PLAN:    Chronic Hypertension - Rebound hypertension due to medication cessation and sumatriptan  use. Current BP 132/98 mmHg.  - Prescribed propranolol  60 mg long-acting at night. - Increased Valsartan  to 80 mg daily. - Reviewed ER visit results, EKG showed no concerning findings.  She plans to see her neurologist soon to adjust her migraines medication.   The patient understands the need to lose weight with diet and exercise. We have discussed specific strategies for this.   We will follow her closely. Once she has completed her neurologic work up to reassess her hypertension control. Target blood pressure is <130/90 mmHg.  Patient Instructions  Medication Instructions:  Your physician has recommended you make the following change in your medication:  START: Propranolol  60 mg nightly START: Valsartan  80 mg once daily *If you need a refill on your cardiac medications before your next appointment, please call your pharmacy*   Follow-Up: At [facility], you and your health needs are our priority.  As part of our continuing mission to provide you with exceptional heart care, our providers are all part of one team.  This team includes your primary Cardiologist (physician) and Advanced Practice Providers or APPs (Physician Assistants and Nurse Practitioners) who all work together to provide you with the care you need, when you need it.  Your next appointment:    16 week   Provider:   Raymar Joiner, DO      Dispo:  No follow-ups on file.   Medication Adjustments/Labs and Tests Ordered: Current  medicines are reviewed at length with the patient today.  Concerns regarding medicines are outlined above.  Tests Ordered: No orders of the defined types were placed in this encounter.  Medication Changes: Meds ordered this encounter  Medications   propranolol  (INDERAL ) 60 MG tablet    Sig: Take 1  tablet (60 mg total) by mouth at bedtime.    Dispense:  90 tablet    Refill:  3   valsartan  (DIOVAN ) 80 MG tablet    Sig: Take 1 tablet (80 mg total) by mouth daily.    Dispense:  90 tablet    Refill:  3

## 2024-02-29 ENCOUNTER — Ambulatory Visit: Payer: Self-pay | Admitting: Gastroenterology

## 2024-02-29 ENCOUNTER — Encounter: Payer: Self-pay | Admitting: Neurology

## 2024-02-29 LAB — ANTI-NUCLEAR AB-TITER (ANA TITER): ANA Titer 1: 1:40 {titer} — ABNORMAL HIGH

## 2024-02-29 LAB — HEPATITIS C ANTIBODY: Hepatitis C Ab: NONREACTIVE

## 2024-02-29 LAB — MITOCHONDRIAL ANTIBODIES: Mitochondrial M2 Ab, IgG: <=20 U (ref <=20.0)

## 2024-02-29 LAB — ANTI-SMOOTH MUSCLE ANTIBODY, IGG: Actin (Smooth Muscle) Antibody (IGG): <20 U (ref <20)

## 2024-02-29 LAB — HEPATITIS A ANTIBODY, TOTAL: Hepatitis A AB,Total: REACTIVE — AB

## 2024-02-29 LAB — ANA: Anti Nuclear Antibody (ANA): POSITIVE — AB

## 2024-02-29 LAB — CERULOPLASMIN: Ceruloplasmin: 27 mg/dL (ref 14–48)

## 2024-02-29 LAB — TISSUE TRANSGLUTAMINASE ABS,IGG,IGA
(tTG) Ab, IgA: <1 U/mL
(tTG) Ab, IgG: <1 U/mL

## 2024-02-29 LAB — IGA: Immunoglobulin A: 137 mg/dL (ref 47–310)

## 2024-02-29 LAB — HEPATITIS B SURFACE ANTIGEN: Hepatitis B Surface Ag: NONREACTIVE

## 2024-02-29 LAB — IGG: IgG (Immunoglobin G), Serum: 1335 mg/dL (ref 600–1640)

## 2024-02-29 LAB — HEPATITIS B SURFACE ANTIBODY,QUALITATIVE: Hep B S Ab: NONREACTIVE

## 2024-02-29 LAB — ALPHA-1-ANTITRYPSIN: A-1 Antitrypsin, Ser: 130 mg/dL (ref 83–199)

## 2024-03-02 ENCOUNTER — Encounter: Payer: Self-pay | Admitting: Family Medicine

## 2024-03-02 ENCOUNTER — Ambulatory Visit: Admitting: Family Medicine

## 2024-03-02 VITALS — BP 104/80 | HR 93 | Temp 98.7°F | Ht 61.0 in | Wt 213.2 lb

## 2024-03-02 DIAGNOSIS — U071 COVID-19: Secondary | ICD-10-CM | POA: Diagnosis not present

## 2024-03-02 DIAGNOSIS — R051 Acute cough: Secondary | ICD-10-CM | POA: Diagnosis not present

## 2024-03-02 LAB — POC COVID19 BINAXNOW: SARS Coronavirus 2 Ag: POSITIVE — AB

## 2024-03-02 MED ORDER — NIRMATRELVIR/RITONAVIR (PAXLOVID)TABLET: 3.0000 | ORAL_TABLET | Freq: Two times a day (BID) | ORAL | 0 refills | Status: AC

## 2024-03-02 NOTE — Patient Instructions (Signed)
 I have sent in Paxlovid  for you to take twice a day for 5 days.    I recommend you eat with this medication, as it can upset your stomach if you do not.  This medication can sometimes leave a bad taste in your mouth, it is okay to use mints, gum, to help combat this.    If this occurs, it is temporary and will resolve once the medication course is completed. There is a minor risk of rebound COVID after taking Paxlovid .  Continue supportive care at home, make sure you are drinking plenty of fluids and getting some rest.  May take ibuprofen  and Tylenol  as needed for fever, aches and pains.  Stay home and away from everyone until you are 24 hours fever free without fever reducing medications.  Follow-up with me if symptoms are persisting over the next week.  Follow-up in the emergency room if you are experiencing shortness of breath, high fever, unremitting cough, severe fatigue, other concerning symptoms.

## 2024-03-02 NOTE — Progress Notes (Signed)
 Acute Office Visit  Subjective:     Patient ID: Alexandra Escobar, female    DOB: April 13, 1981, 43 y.o.   MRN: [id number]  No chief complaint on file.   HPI  43 year old female presents for evaluation of cough, congestion, fatigue, headaches, chills, body aches for the last 2 days. States that her mother started with the same sort of illness a few days ago. Has negative history of COVID. Has completed the first 2 COVID vaccines. Has taken Tylenol  with some relief. States that her daughter is sick as well. Has not taken a COVID or flu test.   ROS Per HPI      Objective:    BP 104/80 (BP Location: Left Arm, Patient Position: Sitting)   Pulse 93   Temp 98.7 F (37.1 C) (Temporal)   Ht 5' 1 (1.549 m)   Wt 213 lb 3.2 oz (96.7 kg)   SpO2 97%   BMI 40.28 kg/m    Physical Exam Vitals and nursing note reviewed.  Constitutional:      General: She is not in acute distress.    Comments: Appears fatigued   HENT:     Head: Normocephalic and atraumatic.     Right Ear: External ear normal.     Left Ear: External ear normal.     Nose: Congestion present.     Mouth/Throat:     Mouth: Mucous membranes are moist.     Pharynx: Oropharynx is clear.     Comments: Oropharyngeal cobblestoning   Eyes:     Extraocular Movements: Extraocular movements intact.     Pupils: Pupils are equal, round, and reactive to light.  Cardiovascular:     Rate and Rhythm: Normal rate and regular rhythm.     Pulses: Normal pulses.     Heart sounds: Normal heart sounds.  Pulmonary:     Effort: Pulmonary effort is normal. No respiratory distress.     Breath sounds: Normal breath sounds. No wheezing, rhonchi or rales.  Musculoskeletal:        General: Normal range of motion.     Cervical back: Normal range of motion.     Right lower leg: No edema.     Left lower leg: No edema.  Lymphadenopathy:     Cervical: Cervical adenopathy present.  Neurological:     General: No focal deficit present.      Mental Status: She is alert and oriented to person, place, and time.  Psychiatric:        Mood and Affect: Mood normal.        Thought Content: Thought content normal.     Results for orders placed or performed in visit on 03/02/24  POC COVID-19 BinaxNow  Result Value Ref Range   SARS Coronavirus 2 Ag Positive (A) Negative        Assessment & Plan:  Acute cough -     POC COVID-19 BinaxNow  COVID-19 -     nirmatrelvir /ritonavir ; Take 3 tablets by mouth 2 (two) times daily for 5 days. (Take nirmatrelvir  150 mg two tablets twice daily for 5 days and ritonavir  100 mg one tablet twice daily for 5 days) Patient GFR is >60  Dispense: 30 tablet; Refill: 0       Orders Placed This Encounter  Procedures   POC COVID-19 BinaxNow     Meds ordered this encounter  Medications   nirmatrelvir /ritonavir  (PAXLOVID ) 20 x 150 MG & 10 x 100MG  TABS    Sig: Take 3 tablets by  mouth 2 (two) times daily for 5 days. (Take nirmatrelvir  150 mg two tablets twice daily for 5 days and ritonavir  100 mg one tablet twice daily for 5 days) Patient GFR is >60    Dispense:  30 tablet    Refill:  0    Return if symptoms worsen or fail to improve.  Corean LITTIE Ku, FNP

## 2024-03-09 ENCOUNTER — Ambulatory Visit: Payer: Self-pay | Admitting: General Surgery

## 2024-03-09 DIAGNOSIS — K8020 Calculus of gallbladder without cholecystitis without obstruction: Secondary | ICD-10-CM

## 2024-03-10 NOTE — Progress Notes (Unsigned)
 NEUROLOGY CONSULTATION NOTE  Alexandra Escobar MRN: [id number] DOB: [birth year]  Referring provider: Corean Ku, FNP Primary care provider: Corean Ku, FNP  Reason for consult:  migraines  Assessment/Plan:   Migraine without aura, without status migrainosus, not intractable  Migraine prevention:  Hold topiramate .  She started magnesium  2 weeks ago and has noticed improvement.  She will continue magnesium  and apply lifestyle modification for now.  If headaches increase, will start topiramate  but have her start at 25mg  at bedtime Migraine rescue:  sumatriptan  100mg , Zofran  4mg  Limit use of pain relievers to no more than 9 days out of the month to prevent risk of rebound or medication-overuse headache. Keep headache diary Follow up 8 months.    Subjective:  Alexandra Escobar is a 43 year old right-handed female with HTN and gallstone who presents for migraines.  History supplemented by referring provider's note.  MRI of brain personally reviewed.  Onset:  November, soon after giving birth to her baby.  No history of headaches to prior to pregnancy or during pregnancy. Location:  left temple Quality:  throbbing Intensity:  7-8/10 (has been 10/10).  Aura:  absent Prodrome:  absent Associated symptoms:  nausea, vomiting, photophobia, phonophobia, blurred vision, right posterior neck pain, left arm tingling (happens when her blood pressure is elevated whether with or without a migraine), elevated blood pressure (160/100).  She denies associated unilateral weakness. Duration:  5 hours or longer Frequency:  2 to 4 days a month (more frequent for the first three months postpartum Frequency of abortive medication: infrequent Triggers:  menses (worse one week prior to menses), emotional stress Relieving factors:  Vomiting, ice pack on neck Activity:  aggravates  02/03/2024 MRI BRAIN WO:  1. No evidence of an acute intracranial abnormality. 2. Tiny nonspecific chronic insult within  the left frontal lobe white matter 3. Otherwise unremarkable non-contrast MRI appearance of the brain. 4. Small mucous retention cyst within the right sphenoid sinus.  Past NSAIDS/analgesics:  Tylenol  Past abortive triptans:  none Past abortive ergotamine:  none Past muscle relaxants:  none Past anti-emetic:  Zofran  Past antihypertensive medications:  propranolol , carvedilol , metoprolol , labetalol , amlodipine , Lasix  Past antidepressant medications:  amitriptyline, Wellbutrin, Lexapro, mirtazapine  Past anticonvulsant medications:  lamotrigine Past anti-CGRP:  Nurtec PRN (ineffective) Past vitamins/Herbal/Supplements:  CoQ10 Past antihistamines/decongestants:  none Other past therapies:  none  Current NSAIDS/analgesics:  Tylenol  Current triptans:  sumatriptan  100mg  (has not tried it yet) Current ergotamine:  none Current anti-emetic:  Zofran -ODT 4mg  Current muscle relaxants:  none Current Antihypertensive medications:  valsartan  Current Antidepressant medications:  sertraline  200mg  daily Current Anticonvulsant medications:  topiramate  200mg  at bedtime (first dose last night - didn't feel well) Current anti-CGRP:  Nurtec PRN Current Vitamins/Herbal/Supplements:  magnesium  Current Antihistamines/Decongestants:  none Other therapy:  none Birth control:  none Other medications:  levothyroxine    Caffeine :  chai latte 2 to 3 times a week.  Soda 1 or 2 times a week Alcohol:  no Smoker:  no Diet:  Does not drink much water  (3 glasses a day).  Doesn't really drink anything during the day.  Protein (2-3 eggs, chicken/meat), limits carbs.  Eats mostly food cooked at home. Exercise:  no.  No motivation.  Tired. Depression:  a little bit; Anxiety:  improved Sleep hygiene:  Insomnia.  Had been falling asleep at 2-3 AM.  Over the last 2 weeks, improved (falling asleep at 12 AM).  Getting at least 6 hours a night.  History of TBI/concussion:  no Family history of headache:  maternal  aunt  (migraines), maternal grandparents (headaches, also history of stroke) Family history of cerebral aneurysm:  no      PAST MEDICAL HISTORY: Past Medical History:  Diagnosis Date   Allergy Seasonal   Anal fissure 2009   Anemia    In the past   Anxiety    Chronic kidney disease    Fatty liver   Colon polyps 2019   Depression    Endometriosis    Fatty liver    GERD (gastroesophageal reflux disease)    Hypertension    Hypothyroidism    Obesity    PCOS (polycystic ovarian syndrome)    Thyroid  disease 2022    PAST SURGICAL HISTORY: Past Surgical History:  Procedure Laterality Date   CESAREAN SECTION N/A 06/18/2023   Procedure: CESAREAN SECTION;  Surgeon: Curlene Agent, MD;  Location: [facility];  Service: Obstetrics;  Laterality: N/A;   COLON SURGERY     Fistula   DILATION AND CURETTAGE OF UTERUS  06/2021   HEMATOMA EVACUATION N/A 06/24/2023   Procedure: EVACUATION HEMATOMA INCISIONAL;  Surgeon: Marne Kelly Nest, MD;  Location: [facility];  Service: Gynecology;  Laterality: N/A;   LAPAROSCOPY     for endometriosis   NASAL SEPTUM SURGERY     OTHER SURGICAL HISTORY     fibroid removal   OTHER SURGICAL HISTORY     fistula drainage    MEDICATIONS: Current Outpatient Medications on File Prior to Visit  Medication Sig Dispense Refill   levothyroxine  (SYNTHROID ) 50 MCG tablet Take 1 tablet (50 mcg total) by mouth daily before breakfast. Schedule an appt for further refills 90 tablet 1   ondansetron  (ZOFRAN -ODT) 4 MG disintegrating tablet Take 1 tablet (4 mg total) by mouth every 8 (eight) hours as needed. (Patient not taking: Reported on 02/26/2024) 20 tablet 6   propranolol  (INDERAL ) 60 MG tablet Take 1 tablet (60 mg total) by mouth at bedtime. 90 tablet 3   Rimegepant Sulfate (NURTEC) 75 MG TBDP Take 1 tablet (75 mg total) by mouth every other day as needed. 8 tablet 2   sertraline  (ZOLOFT ) 100 MG tablet Take 150 mg by mouth daily.     SUMAtriptan  (IMITREX ) 100 MG tablet Take  1 tablet (100 mg total) by mouth once as needed for up to 1 dose for migraine. May repeat in 2 hours if headache persists or recurs. 10 tablet 6   topiramate  (TOPAMAX ) 50 MG tablet Take 2 tablets (100 mg total) by mouth at bedtime. (Patient not taking: Reported on 02/26/2024) 60 tablet 11   valsartan  (DIOVAN ) 80 MG tablet Take 1 tablet (80 mg total) by mouth daily. 90 tablet 3   No current facility-administered medications on file prior to visit.    ALLERGIES: Allergies  Allergen Reactions   Toradol  [Ketorolac  Tromethamine ] Anxiety    FAMILY HISTORY: Family History  Problem Relation Age of Onset   Hypertension Father    Depression Father    Anxiety disorder Father    Hypertension Sister    Cancer Maternal Grandmother        breast   Hypertension Paternal Grandmother     Objective:  Blood pressure 96/67, pulse 89, height 5' 1 (1.549 m), weight 215 lb (97.5 kg), last menstrual period 02/24/2024, SpO2 97%, not currently breastfeeding. General: No acute distress.  Patient appears well-groomed.   Head:  Normocephalic/atraumatic Eyes:  fundi examined but not visualized Neck: supple, no paraspinal tenderness, full range of motion Heart: regular rate and rhythm Neurological Exam: Mental status: alert  and oriented to person, place, and time, speech fluent and not dysarthric, language intact. Cranial nerves: CN I: not tested CN II: pupils equal, round and reactive to light, visual fields intact CN III, IV, VI:  full range of motion, no nystagmus, no ptosis CN V: facial sensation intact. CN VII: upper and lower face symmetric CN VIII: hearing intact CN IX, X: gag intact, uvula midline CN XI: sternocleidomastoid and trapezius muscles intact CN XII: tongue midline Bulk & Tone: normal, no fasciculations. Motor:  muscle strength 5/5 throughout Sensation:  Pinprick and vibratory sensation intact. Deep Tendon Reflexes:  2+ throughout,  toes downgoing.   Finger to nose testing:  Without  dysmetria.   Gait:  Normal station and stride.  Romberg negative.    Thank you for allowing me to take part in the care of this patient.  Juliene Dunnings, DO  CC: Susie Molt, MD  Corean Ku, FNP

## 2024-03-11 ENCOUNTER — Ambulatory Visit (INDEPENDENT_AMBULATORY_CARE_PROVIDER_SITE_OTHER): Admitting: Neurology

## 2024-03-11 ENCOUNTER — Ambulatory Visit: Admitting: Pharmacist

## 2024-03-11 ENCOUNTER — Encounter: Payer: Self-pay | Admitting: Neurology

## 2024-03-11 VITALS — BP 96/67 | HR 89 | Ht 61.0 in | Wt 215.0 lb

## 2024-03-11 DIAGNOSIS — G43009 Migraine without aura, not intractable, without status migrainosus: Secondary | ICD-10-CM

## 2024-03-11 NOTE — Patient Instructions (Signed)
  Consider supplements:  magnesium  citrate 400mg  daily, riboflavin 400mg  daily, coenzyme Q10 100mg  three times daily.  Take sumatriptan  100mg  at earliest onset of headache.  May repeat dose once in 2 hours if needed.  Maximum 2 tablets in 24 hours. Zofran  for nausea Limit use of pain relievers to no more than 9 days out of the month.  These medications include acetaminophen , NSAIDs (ibuprofen /Advil /Motrin , naproxen /Aleve , triptans (Imitrex /sumatriptan ), Excedrin, and narcotics.  This will help reduce risk of rebound headaches. Be aware of common food triggers Routine exercise Stay adequately hydrated (aim for 64 oz water  daily) Keep headache diary Maintain proper stress management Maintain proper sleep hygiene Do not skip meals

## 2024-03-25 ENCOUNTER — Ambulatory Visit: Admitting: Pharmacist

## 2024-04-06 ENCOUNTER — Other Ambulatory Visit: Payer: Self-pay

## 2024-04-06 ENCOUNTER — Encounter (HOSPITAL_COMMUNITY): Payer: Self-pay | Admitting: General Surgery

## 2024-04-06 NOTE — Progress Notes (Addendum)
 SDW CALL  Patient was given pre-op instructions over the phone. The opportunity was given for the patient to ask questions. No further questions asked. Patient verbalized understanding of instructions given.   Date and Arrival time- April 07, 2024 at 12:00 PM (noon)  PCP - Joshua Gissell CROME, MD Cardiologist - Sheena Pugh, DO (LOV 02-26-24 per patient follow up in October) Neurology -Skeet, Juliene SAUNDERS, DO (LOV 03-11-24)  PPM/ICD - denies Device Orders - n/a Rep Notified - n/a  Chest x-ray - 02-23-24 EKG - 02-23-24 Stress Test -  ECHO - 06-18-23 Cardiac Cath -   Sleep Study - denies CPAP - n/a  DM -denies  Blood Thinner Instructions:denies Aspirin  Instructions:denies  ERAS Protcol -clear liquids until 11:30 am  COVID TEST- n/a   Anesthesia review: yes hx of HTN   Patient denies shortness of breath, fever, cough and chest pain over the phone call   All instructions explained to the patient, with a verbal understanding of the material. Patient agrees to go over the instructions while at home for a better understanding.

## 2024-04-06 NOTE — Anesthesia Preprocedure Evaluation (Signed)
 Anesthesia Evaluation  Patient identified by MRN, date of birth, ID band Patient awake    Reviewed: Allergy & Precautions, NPO status , Patient's Chart, lab work & pertinent test results  Airway Mallampati: II  TM Distance: >3 FB Neck ROM: Full    Dental no notable dental hx.    Pulmonary neg pulmonary ROS   Pulmonary exam normal        Cardiovascular hypertension, Pt. on medications Normal cardiovascular exam  TA Coronary 11/05/2023: IMPRESSION: 1. No evidence of CAD, CADRADS = 0. 2. Coronary calcium  score of 0. This was 0 percentile for age-, sex-, and race- matched controls. 3.  No plaque detected by heartflow. 4. Normal coronary origin with right dominance.  Echo (Limited) 06/18/2023: 1. Limited Echo to evaluate for LVEF and pericardial effusion.  2. Left ventricular ejection fraction, by estimation, is 60 to 65%. The  left ventricle has normal function. The left ventricle has no regional  wall motion abnormalities. Left ventricular diastolic function could not  be evaluated.  3. Right ventricular systolic function is normal. The right ventricular  size is normal.  4. The inferior vena cava is normal in size with greater than 50%  respiratory variability, suggesting right atrial pressure of 3 mmHg.  5. No pericardial effusion.     Neuro/Psych  Headaches PSYCHIATRIC DISORDERS Anxiety Depression       GI/Hepatic Neg liver ROS,,,  Endo/Other  Hypothyroidism  Class 3 obesityPCOS (polycystic ovarian syndrome)  Renal/GU Renal disease     Musculoskeletal negative musculoskeletal ROS (+)    Abdominal  (+) + obese  Peds  Hematology negative hematology ROS (+)   Anesthesia Other Findings GALLSTONES  Reproductive/Obstetrics Hcg negative                              Anesthesia Physical Anesthesia Plan  ASA: 3  Anesthesia Plan: General   Post-op Pain Management:    Induction:  Intravenous  PONV Risk Score and Plan: 4 or greater and Ondansetron , Dexamethasone , Midazolam , Scopolamine  patch - Pre-op and Treatment may vary due to age or medical condition  Airway Management Planned: Oral ETT  Additional Equipment:   Intra-op Plan:   Post-operative Plan: Extubation in OR  Informed Consent: I have reviewed the patients History and Physical, chart, labs and discussed the procedure including the risks, benefits and alternatives for the proposed anesthesia with the patient or authorized representative who has indicated his/her understanding and acceptance.     Dental advisory given  Plan Discussed with: CRNA  Anesthesia Plan Comments: (PAT note written 04/06/2024 by Myrka Sylva, PA-C.  )         Anesthesia Quick Evaluation

## 2024-04-06 NOTE — Progress Notes (Signed)
 Anesthesia Chart Review: CANDELARIA MICHAE POLITE  Case: [id number] Date/Time: 04/07/24 1415   Procedure: LAPAROSCOPIC CHOLECYSTECTOMY WITH INTRAOPERATIVE CHOLANGIOGRAM - POSSIBLE IOC   Anesthesia type: General   Diagnosis: Gallstones [K80.20]   Pre-op diagnosis: GALLSTONES   [facility] ROOM 09 / [facility]   Surgeons: Curvin Deward MOULD, MD       DISCUSSION: Patient is a 43 year old female scheduled for the above procedure.  History includes never smoker, HTN, fatty liver, PCOS, migraines, anemia, GERD, hypothyroidism, endometriosis, preclampsia (06/18/2023, s/p c-section complicated by incisional hematoma, s/p evacuation 06/23/2023). + COVID-19 test 03/02/2024 (symptoms started ~ 02/29/2024).  Cardiology visit on 02/26/2024 with Dr. Sheena for follow-up HTN with pre-eclampsia requiring induction of labor/c-section 06/18/2023. She had a post-operative incisional hematoma requiring evacuation in the OR. She had post-partum HF, suspected due to high output HR due to anemia. In April 2025 she underwent a CCTA for chest pain and showed CAC of 0 with no evidence of CAD. 05/2023 TTE showed normal LVEF, normal LV/RV function, normal PASP, trivial MR. At 02/26/2024 visit BP 130/100. She had been taken off BP medications by PCP previously due to lightheaded ness and hypotension, bo was started on medication for migraine headaches. Dr. Sheena restarted her on Valsarten 40 mg in late July. Her elevated readings were thought to be related to chronic HTN with rebound hypertension due to medication cessation and sumatriptan  use. She was prescribed propranolol  60 mg long-acting at night and Valsartan  increased to 80 mg daily. 4 month follow-up planned. Last BP reading 96/67 on 03/11/2024 and 104/80 on 03/02/2024, so by medication list is in only on valsartan  40 mg daily.   He is a same-day workup so anesthesia team to evaluate on the day of surgery.  Updated labs as indicated on arrival.   VS: Ht 5' 1 (1.549 m)   Wt 97.5 kg   LMP  03/26/2024   BMI 40.61 kg/m  BP Readings from Last 3 Encounters:  03/11/24 96/67  03/02/24 104/80  02/26/24 (!) 130/100   Pulse Readings from Last 3 Encounters:  03/11/24 89  03/02/24 93  02/26/24 81    PROVIDERS: Joshua Aribella CROME, MD is PCP  Sheena Pugh, DO is cardiologist Skeet Cornet, DO is neurologist.  Last visit 03/11/2024 for follow-up migraines. Elyse Kitchens, MD is ENT. Evaluated on 03/17/2024 for incidental finding of small mucous retention cyst in the right sphenoid sinus.  The cyst was not felt to be contributing to her headaches.  As needed follow-up recommended.   LABS: For day of surgery as indicated. Most recent results in [facility] include: Lab Results  Component Value Date   WBC 13.4 (H) 02/23/2024   HGB 13.4 02/23/2024   HCT 39.4 02/23/2024   PLT 275 02/23/2024   GLUCOSE 93 02/23/2024   ALT 57 (H) 02/25/2024   AST 33 02/25/2024   NA 138 02/23/2024   K 3.9 02/23/2024   CL 102 02/23/2024   CREATININE 0.62 02/23/2024   BUN 10 02/23/2024   CO2 24 02/23/2024   TSH 1.06 01/11/2024   INR 1.0 02/25/2024     IMAGES: CXR 02/23/2024: FINDINGS: The heart size and mediastinal contours are within normal limits. Both lungs are clear. The visualized skeletal structures are unremarkable. IMPRESSION: No active cardiopulmonary disease.  US  RUQ Abd 02/16/2024: IMPRESSION: 1. Cholelithiasis with a possible sonographic Murphy sign. No wall thickening or pericholecystic fluid. Findings are equivocal for acute cholecystitis. If there is clinical concern for acute cholecystitis, consider further evaluation with HIDA scan.  2. Hepatic steatosis.  MRI Brain 02/03/2024: IMPRESSION: 1. No evidence of an acute intracranial abnormality. 2. Tiny nonspecific chronic insult within the left frontal lobe white matter 3. Otherwise unremarkable non-contrast MRI appearance of the brain. 4. Small mucous retention cyst within the right sphenoid sinus.  CT Chest (over read CCTA) 11/05/2023:   IMPRESSION: No acute or unexpected extracardiac findings.   EKG: 02/23/2024: NSR   CV: CTA Coronary 11/05/2023: IMPRESSION: 1. No evidence of CAD, CADRADS = 0. 2. Coronary calcium  score of 0. This was 0 percentile for age-, sex-, and race- matched controls. 3.  No plaque detected by heartflow. 4. Normal coronary origin with right dominance. INTERPRETATION: CAD-RADS 0: No evidence of CAD (0%). Consider non-atherosclerotic causes of chest pain.  Echo (Limited) 06/18/2023: IMPRESSIONS   1. Limited Echo to evaluate for LVEF and pericardial effusion.   2. Left ventricular ejection fraction, by estimation, is 60 to 65%. The  left ventricle has normal function. The left ventricle has no regional  wall motion abnormalities. Left ventricular diastolic function could not  be evaluated.   3. Right ventricular systolic function is normal. The right ventricular  size is normal.   4. The inferior vena cava is normal in size with greater than 50%  respiratory variability, suggesting right atrial pressure of 3 mmHg.   5. No pericardial effusion.   Echo (Complete) 06/05/2023: IMPRESSIONS   1. Left ventricular ejection fraction, by estimation, is 65 to 70%. The  left ventricle has normal function. The left ventricle has no regional  wall motion abnormalities. Left ventricular diastolic parameters were  normal.   2. Right ventricular systolic function is normal. The right ventricular  size is normal. There is normal pulmonary artery systolic pressure. The  estimated right ventricular systolic pressure is 23.4 mmHg.   3. The mitral valve is normal in structure. Trivial mitral valve  regurgitation. No evidence of mitral stenosis.   4. The aortic valve is normal in structure. Aortic valve regurgitation is  not visualized. No aortic stenosis is present. Aortic valve area, by VTI  measures 1.91 cm. Aortic valve mean gradient measures 7.3 mmHg. Aortic  valve Vmax measures 1.86 m/s.   5. The  inferior vena cava is normal in size with greater than 50%  respiratory variability, suggesting right atrial pressure of 3 mmHg.  - IAS/Shunts: No atrial level shunt detected by color flow Doppler.   Past Medical History:  Diagnosis Date   Allergy Seasonal   Anal fissure 2009   Anemia    In the past   Anxiety    Chronic kidney disease    Fatty liver   Colon polyps 2019   Depression    Endometriosis    Fatty liver    GERD (gastroesophageal reflux disease)    Headache    Hypertension    Hypothyroidism    Obesity    PCOS (polycystic ovarian syndrome)    Thyroid  disease 2022    Past Surgical History:  Procedure Laterality Date   CESAREAN SECTION N/A 06/18/2023   Procedure: CESAREAN SECTION;  Surgeon: Curlene Agent, MD;  Location: [facility];  Service: Obstetrics;  Laterality: N/A;   COLON SURGERY     Fistula   DILATION AND CURETTAGE OF UTERUS  06/2021   HEMATOMA EVACUATION N/A 06/24/2023   Procedure: EVACUATION HEMATOMA INCISIONAL;  Surgeon: Marne Kelly Nest, MD;  Location: [facility] OR;  Service: Gynecology;  Laterality: N/A;   LAPAROSCOPY     for endometriosis   NASAL SEPTUM SURGERY  OTHER SURGICAL HISTORY     fibroid removal   OTHER SURGICAL HISTORY     fistula drainage    MEDICATIONS: No current facility-administered medications for this encounter.    levothyroxine  (SYNTHROID ) 50 MCG tablet   MAGNESIUM  GLYCINATE PO   Rimegepant Sulfate (NURTEC) 75 MG TBDP   sertraline  (ZOLOFT ) 100 MG tablet   valsartan  (DIOVAN ) 80 MG tablet   topiramate  (TOPAMAX ) 50 MG tablet    Isaiah Ruder, PA-C Surgical Short Stay/Anesthesiology [facility] Phone [phone] [facility] Phone [phone] 04/06/2024 12:03 PM

## 2024-04-07 ENCOUNTER — Ambulatory Visit (HOSPITAL_COMMUNITY)
Admission: RE | Admit: 2024-04-07 | Discharge: 2024-04-07 | Disposition: A | Discharge status: Home / Self Care | Attending: General Surgery | Admitting: General Surgery

## 2024-04-07 ENCOUNTER — Encounter (HOSPITAL_COMMUNITY): Payer: Self-pay | Admitting: Vascular Surgery

## 2024-04-07 ENCOUNTER — Ambulatory Visit (HOSPITAL_BASED_OUTPATIENT_CLINIC_OR_DEPARTMENT_OTHER): Payer: Self-pay | Admitting: Vascular Surgery

## 2024-04-07 ENCOUNTER — Encounter (HOSPITAL_COMMUNITY): Payer: Self-pay | Admitting: General Surgery

## 2024-04-07 ENCOUNTER — Ambulatory Visit (HOSPITAL_COMMUNITY)

## 2024-04-07 ENCOUNTER — Other Ambulatory Visit: Payer: Self-pay

## 2024-04-07 ENCOUNTER — Encounter (HOSPITAL_COMMUNITY)
Admission: RE | Disposition: A | Discharge status: Home / Self Care | Payer: Self-pay | Source: Home / Self Care | Attending: General Surgery

## 2024-04-07 DIAGNOSIS — K8012 Calculus of gallbladder with acute and chronic cholecystitis without obstruction: Secondary | ICD-10-CM | POA: Diagnosis not present

## 2024-04-07 DIAGNOSIS — N809 Endometriosis, unspecified: Secondary | ICD-10-CM | POA: Insufficient documentation

## 2024-04-07 DIAGNOSIS — N189 Chronic kidney disease, unspecified: Secondary | ICD-10-CM | POA: Diagnosis not present

## 2024-04-07 DIAGNOSIS — I129 Hypertensive chronic kidney disease with stage 1 through stage 4 chronic kidney disease, or unspecified chronic kidney disease: Secondary | ICD-10-CM | POA: Diagnosis not present

## 2024-04-07 DIAGNOSIS — E039 Hypothyroidism, unspecified: Secondary | ICD-10-CM

## 2024-04-07 DIAGNOSIS — F32A Depression, unspecified: Secondary | ICD-10-CM | POA: Diagnosis not present

## 2024-04-07 DIAGNOSIS — F419 Anxiety disorder, unspecified: Secondary | ICD-10-CM | POA: Insufficient documentation

## 2024-04-07 DIAGNOSIS — I10 Essential (primary) hypertension: Secondary | ICD-10-CM | POA: Diagnosis not present

## 2024-04-07 DIAGNOSIS — K8020 Calculus of gallbladder without cholecystitis without obstruction: Secondary | ICD-10-CM | POA: Diagnosis present

## 2024-04-07 DIAGNOSIS — Z79899 Other long term (current) drug therapy: Secondary | ICD-10-CM | POA: Insufficient documentation

## 2024-04-07 DIAGNOSIS — F418 Other specified anxiety disorders: Secondary | ICD-10-CM

## 2024-04-07 DIAGNOSIS — D649 Anemia, unspecified: Secondary | ICD-10-CM | POA: Diagnosis not present

## 2024-04-07 DIAGNOSIS — G43909 Migraine, unspecified, not intractable, without status migrainosus: Secondary | ICD-10-CM | POA: Diagnosis not present

## 2024-04-07 DIAGNOSIS — K760 Fatty (change of) liver, not elsewhere classified: Secondary | ICD-10-CM | POA: Diagnosis not present

## 2024-04-07 DIAGNOSIS — K219 Gastro-esophageal reflux disease without esophagitis: Secondary | ICD-10-CM | POA: Insufficient documentation

## 2024-04-07 DIAGNOSIS — Z79890 Hormone replacement therapy (postmenopausal): Secondary | ICD-10-CM | POA: Insufficient documentation

## 2024-04-07 DIAGNOSIS — E282 Polycystic ovarian syndrome: Secondary | ICD-10-CM | POA: Insufficient documentation

## 2024-04-07 HISTORY — DX: Headache, unspecified: R51.9

## 2024-04-07 HISTORY — PX: CHOLECYSTECTOMY: SHX55

## 2024-04-07 LAB — COMPREHENSIVE METABOLIC PANEL WITH GFR
ALT: 37 U/L (ref 0–44)
AST: 29 U/L (ref 15–41)
Albumin: 3.9 g/dL (ref 3.5–5.0)
Alkaline Phosphatase: 80 U/L (ref 38–126)
Anion gap: 11 (ref 5–15)
BUN: 8 mg/dL (ref 6–20)
CO2: 25 mmol/L (ref 22–32)
Calcium: 8.9 mg/dL (ref 8.9–10.3)
Chloride: 103 mmol/L (ref 98–111)
Creatinine, Ser: 0.58 mg/dL (ref 0.44–1.00)
GFR, Estimated: >60 mL/min (ref >60)
Glucose, Bld: 99 mg/dL (ref 70–99)
Potassium: 4 mmol/L (ref 3.5–5.1)
Sodium: 139 mmol/L (ref 135–145)
Total Bilirubin: 0.7 mg/dL (ref 0.0–1.2)
Total Protein: 7.4 g/dL (ref 6.5–8.1)

## 2024-04-07 LAB — POCT I-STAT, CHEM 8
BUN: 8 mg/dL (ref 6–20)
Calcium, Ion: 1.13 mmol/L — ABNORMAL LOW (ref 1.15–1.40)
Chloride: 104 mmol/L (ref 98–111)
Creatinine, Ser: 0.7 mg/dL (ref 0.44–1.00)
Glucose, Bld: 100 mg/dL — ABNORMAL HIGH (ref 70–99)
HCT: 43 % (ref 36.0–46.0)
Hemoglobin: 14.6 g/dL (ref 12.0–15.0)
Potassium: 3.9 mmol/L (ref 3.5–5.1)
Sodium: 139 mmol/L (ref 135–145)
TCO2: 24 mmol/L (ref 22–32)

## 2024-04-07 LAB — CBC
HCT: 42.7 % (ref 36.0–46.0)
Hemoglobin: 14.1 g/dL (ref 12.0–15.0)
MCH: 29.9 pg (ref 26.0–34.0)
MCHC: 33 g/dL (ref 30.0–36.0)
MCV: 90.7 fL (ref 80.0–100.0)
Platelets: 286 K/uL (ref 150–400)
RBC: 4.71 MIL/uL (ref 3.87–5.11)
RDW: 14.1 % (ref 11.5–15.5)
WBC: 8.6 K/uL (ref 4.0–10.5)
nRBC: 0 % (ref 0.0–0.2)

## 2024-04-07 LAB — POCT PREGNANCY, URINE: Preg Test, Ur: NEGATIVE

## 2024-04-07 SURGERY — LAPAROSCOPIC CHOLECYSTECTOMY WITH INTRAOPERATIVE CHOLANGIOGRAM
Anesthesia: General

## 2024-04-07 MED ORDER — ONDANSETRON HCL 4 MG/2ML IJ SOLN
INTRAMUSCULAR | Status: DC | PRN
  Administered 2024-04-07: 4 mg via INTRAVENOUS

## 2024-04-07 MED ORDER — MIDAZOLAM HCL 2 MG/2ML IJ SOLN
INTRAMUSCULAR | Status: DC | PRN
  Administered 2024-04-07: 2 mg via INTRAVENOUS

## 2024-04-07 MED ORDER — OXYCODONE HCL 5 MG PO TABS
ORAL_TABLET | ORAL | Status: AC
  Filled 2024-04-07: qty 2

## 2024-04-07 MED ORDER — LACTATED RINGERS IV SOLN
INTRAVENOUS | Status: DC
Start: 2024-04-07 — End: 2024-04-07

## 2024-04-07 MED ORDER — 0.9 % SODIUM CHLORIDE (POUR BTL) OPTIME
TOPICAL | Status: DC | PRN
  Administered 2024-04-07: 1000 mL

## 2024-04-07 MED ORDER — OXYCODONE HCL 5 MG/5ML PO SOLN: 5.0000 mg | Freq: Once | ORAL | Status: DC | PRN

## 2024-04-07 MED ORDER — CHLORHEXIDINE GLUCONATE 0.12 % MT SOLN
15.0000 mL | Freq: Once | OROMUCOSAL | Status: AC
  Administered 2024-04-07: 15 mL via OROMUCOSAL
  Filled 2024-04-07: qty 15

## 2024-04-07 MED ORDER — ACETAMINOPHEN 10 MG/ML IV SOLN
1000.0000 mg | Freq: Once | INTRAVENOUS | Status: AC
  Administered 2024-04-07: 1000 mg via INTRAVENOUS

## 2024-04-07 MED ORDER — ONDANSETRON HCL 4 MG/2ML IJ SOLN
INTRAMUSCULAR | Status: AC
  Filled 2024-04-07: qty 2

## 2024-04-07 MED ORDER — OXYCODONE HCL 5 MG PO TABS: 5.0000 mg | ORAL_TABLET | Freq: Four times a day (QID) | ORAL | 0 refills | Status: DC | PRN

## 2024-04-07 MED ORDER — ACETAMINOPHEN 500 MG PO TABS
1000.0000 mg | ORAL_TABLET | ORAL | Status: AC
  Administered 2024-04-07: 1000 mg via ORAL
  Filled 2024-04-07: qty 2

## 2024-04-07 MED ORDER — AMISULPRIDE (ANTIEMETIC) 5 MG/2ML IV SOLN: 10.0000 mg | Freq: Once | INTRAVENOUS | Status: DC | PRN

## 2024-04-07 MED ORDER — FENTANYL CITRATE (PF) 100 MCG/2ML IJ SOLN
INTRAMUSCULAR | Status: AC
  Filled 2024-04-07: qty 2

## 2024-04-07 MED ORDER — LIDOCAINE 2% (20 MG/ML) 5 ML SYRINGE
INTRAMUSCULAR | Status: AC
  Filled 2024-04-07: qty 5

## 2024-04-07 MED ORDER — GABAPENTIN 100 MG PO CAPS
100.0000 mg | ORAL_CAPSULE | ORAL | Status: AC
Start: 2024-04-07 — End: 2024-04-07
  Administered 2024-04-07: 100 mg via ORAL
  Filled 2024-04-07: qty 1

## 2024-04-07 MED ORDER — MIDAZOLAM HCL 2 MG/2ML IJ SOLN
INTRAMUSCULAR | Status: AC
  Filled 2024-04-07: qty 2

## 2024-04-07 MED ORDER — OXYCODONE HCL 5 MG PO TABS
10.0000 mg | ORAL_TABLET | Freq: Once | ORAL | Status: AC
  Administered 2024-04-07: 10 mg via ORAL

## 2024-04-07 MED ORDER — CHLORHEXIDINE GLUCONATE CLOTH 2 % EX PADS: 6.0000 | MEDICATED_PAD | Freq: Once | CUTANEOUS | Status: DC

## 2024-04-07 MED ORDER — FENTANYL CITRATE (PF) 250 MCG/5ML IJ SOLN
INTRAMUSCULAR | Status: AC
  Filled 2024-04-07: qty 5

## 2024-04-07 MED ORDER — FENTANYL CITRATE (PF) 250 MCG/5ML IJ SOLN
INTRAMUSCULAR | Status: DC | PRN
  Administered 2024-04-07: 150 ug via INTRAVENOUS

## 2024-04-07 MED ORDER — OXYCODONE HCL 5 MG PO TABS: 5.0000 mg | ORAL_TABLET | Freq: Once | ORAL | Status: DC | PRN

## 2024-04-07 MED ORDER — PROPOFOL 10 MG/ML IV BOLUS
INTRAVENOUS | Status: AC
  Filled 2024-04-07: qty 20

## 2024-04-07 MED ORDER — CEFAZOLIN SODIUM-DEXTROSE 2-4 GM/100ML-% IV SOLN
2.0000 g | INTRAVENOUS | Status: AC
Start: 2024-04-07 — End: 2024-04-07
  Administered 2024-04-07: 2 g via INTRAVENOUS
  Filled 2024-04-07: qty 100

## 2024-04-07 MED ORDER — ROCURONIUM BROMIDE 10 MG/ML (PF) SYRINGE
PREFILLED_SYRINGE | INTRAVENOUS | Status: AC
  Filled 2024-04-07: qty 10

## 2024-04-07 MED ORDER — FENTANYL CITRATE (PF) 100 MCG/2ML IJ SOLN
25.0000 ug | INTRAMUSCULAR | Status: DC | PRN
  Administered 2024-04-07 (×3): 50 ug via INTRAVENOUS

## 2024-04-07 MED ORDER — LIDOCAINE 2% (20 MG/ML) 5 ML SYRINGE
INTRAMUSCULAR | Status: DC | PRN
  Administered 2024-04-07: 60 mg via INTRAVENOUS

## 2024-04-07 MED ORDER — ROCURONIUM BROMIDE 10 MG/ML (PF) SYRINGE
PREFILLED_SYRINGE | INTRAVENOUS | Status: DC | PRN
  Administered 2024-04-07: 60 mg via INTRAVENOUS

## 2024-04-07 MED ORDER — ACETAMINOPHEN 10 MG/ML IV SOLN
INTRAVENOUS | Status: AC
  Filled 2024-04-07: qty 100

## 2024-04-07 MED ORDER — PROPOFOL 10 MG/ML IV BOLUS
INTRAVENOUS | Status: DC | PRN
  Administered 2024-04-07: 200 mg via INTRAVENOUS

## 2024-04-07 MED ORDER — BUPIVACAINE-EPINEPHRINE 0.25% -1:200000 IJ SOLN
INTRAMUSCULAR | Status: DC | PRN
  Administered 2024-04-07: 26 mL

## 2024-04-07 MED ORDER — EPHEDRINE 5 MG/ML INJ
INTRAVENOUS | Status: AC
  Filled 2024-04-07: qty 5

## 2024-04-07 MED ORDER — SUGAMMADEX SODIUM 200 MG/2ML IV SOLN
INTRAVENOUS | Status: DC | PRN
  Administered 2024-04-07: 200 mg via INTRAVENOUS

## 2024-04-07 MED ORDER — DEXMEDETOMIDINE HCL IN NACL 80 MCG/20ML IV SOLN
INTRAVENOUS | Status: DC | PRN
Start: 2024-04-07 — End: 2024-04-07
  Administered 2024-04-07 (×3): 4 ug via INTRAVENOUS

## 2024-04-07 MED ORDER — ORAL CARE MOUTH RINSE: 15.0000 mL | Freq: Once | OROMUCOSAL | Status: AC

## 2024-04-07 MED ORDER — PROPOFOL 10 MG/ML IV BOLUS
INTRAVENOUS | Status: AC
Start: 2024-04-07 — End: 2024-04-07
  Filled 2024-04-07: qty 20

## 2024-04-07 MED ORDER — SODIUM CHLORIDE 0.9 % IR SOLN
Status: DC | PRN
  Administered 2024-04-07: 1000 mL

## 2024-04-07 MED ORDER — DEXAMETHASONE SODIUM PHOSPHATE 10 MG/ML IJ SOLN
INTRAMUSCULAR | Status: DC | PRN
Start: 2024-04-07 — End: 2024-04-07
  Administered 2024-04-07: 4 mg via INTRAVENOUS

## 2024-04-07 MED ORDER — SCOPOLAMINE 1 MG/3DAYS TD PT72
1.0000 | MEDICATED_PATCH | TRANSDERMAL | Status: DC
  Administered 2024-04-07: 1 mg via TRANSDERMAL
  Filled 2024-04-07: qty 1

## 2024-04-07 MED ORDER — BUPIVACAINE-EPINEPHRINE (PF) 0.25% -1:200000 IJ SOLN
INTRAMUSCULAR | Status: AC
  Filled 2024-04-07: qty 30

## 2024-04-07 SURGICAL SUPPLY — 34 items
BAG COUNTER SPONGE SURGICOUNT (BAG) ×1 IMPLANT
BLADE CLIPPER SURG (BLADE) IMPLANT
CANISTER SUCTION 3000ML PPV (SUCTIONS) ×1 IMPLANT
CATH REDDICK CHOLANGI 4FR 50CM (CATHETERS) ×1 IMPLANT
CHLORAPREP W/TINT 26 (MISCELLANEOUS) ×1 IMPLANT
CLIP APPLIE 5 13 M/L LIGAMAX5 (MISCELLANEOUS) ×1 IMPLANT
COVER MAYO STAND STRL (DRAPES) ×1 IMPLANT
COVER SURGICAL LIGHT HANDLE (MISCELLANEOUS) ×1 IMPLANT
DERMABOND ADVANCED .7 DNX12 (GAUZE/BANDAGES/DRESSINGS) ×1 IMPLANT
DRAPE C-ARM 42X120 X-RAY (DRAPES) ×1 IMPLANT
ELECTRODE REM PT RTRN 9FT ADLT (ELECTROSURGICAL) ×1 IMPLANT
GLOVE BIO SURGEON STRL SZ7.5 (GLOVE) ×1 IMPLANT
GOWN STRL REUS W/ TWL LRG LVL3 (GOWN DISPOSABLE) ×3 IMPLANT
IRRIGATION SUCT STRKRFLW 2 WTP (MISCELLANEOUS) ×1 IMPLANT
IV CATH 14GX2 1/4 (CATHETERS) ×1 IMPLANT
KIT BASIN OR (CUSTOM PROCEDURE TRAY) ×1 IMPLANT
KIT IMAGING PINPOINTPAQ (MISCELLANEOUS) IMPLANT
KIT TURNOVER KIT B (KITS) ×1 IMPLANT
NS IRRIG 1000ML POUR BTL (IV SOLUTION) ×1 IMPLANT
PAD ARMBOARD POSITIONER FOAM (MISCELLANEOUS) ×1 IMPLANT
SCISSORS LAP 5X35 DISP (ENDOMECHANICALS) ×1 IMPLANT
SET TUBE SMOKE EVAC HIGH FLOW (TUBING) ×1 IMPLANT
SLEEVE Z-THREAD 5X100MM (TROCAR) ×2 IMPLANT
SPECIMEN JAR SMALL (MISCELLANEOUS) ×1 IMPLANT
SUT MNCRL AB 4-0 PS2 18 (SUTURE) ×1 IMPLANT
SUT VICRYL 0 UR6 27IN ABS (SUTURE) IMPLANT
SYSTEM BAG RETRIEVAL 10MM (BASKET) ×1 IMPLANT
TOWEL GREEN STERILE (TOWEL DISPOSABLE) ×1 IMPLANT
TOWEL GREEN STERILE FF (TOWEL DISPOSABLE) ×1 IMPLANT
TRAY LAPAROSCOPIC MC (CUSTOM PROCEDURE TRAY) ×1 IMPLANT
TROCAR BALLN 12MMX100 BLUNT (TROCAR) ×1 IMPLANT
TROCAR Z-THREAD OPTICAL 5X100M (TROCAR) ×1 IMPLANT
WARMER LAPAROSCOPE (MISCELLANEOUS) ×1 IMPLANT
WATER STERILE IRR 1000ML POUR (IV SOLUTION) ×1 IMPLANT

## 2024-04-07 NOTE — Op Note (Signed)
 04/07/2024  2:59 PM  PATIENT:  Alexandra Escobar  43 y.o. female  PRE-OPERATIVE DIAGNOSIS:  GALLSTONES  POST-OPERATIVE DIAGNOSIS:  GALLSTONES  PROCEDURE:  Procedure(s): LAPAROSCOPIC CHOLECYSTECTOMY (N/A)  SURGEON:  Surgeons and Role:    * Curvin Deward MOULD, MD - Primary  PHYSICIAN ASSISTANT:   ASSISTANTS: Powell Seats, RNFA   ANESTHESIA:   local and general  EBL:  minimal   BLOOD ADMINISTERED:none  DRAINS: none   LOCAL MEDICATIONS USED:  MARCAINE      SPECIMEN:  Source of Specimen:  gallbladder  DISPOSITION OF SPECIMEN:  PATHOLOGY  COUNTS:  YES  TOURNIQUET:  * No tourniquets in log *  DICTATION: .Dragon Dictation    Procedure: After informed consent was obtained the patient was brought to the operating room and placed in the supine position on the operating room table. After adequate induction of general anesthesia the patient's abdomen was prepped with ChloraPrep allowed to dry and draped in usual sterile manner. An appropriate timeout was performed. The area below the umbilicus was infiltrated with quarter percent  Marcaine . A small incision was made with a 15 blade knife. The incision was carried down through the subcutaneous tissue bluntly with a hemostat and [employer] retractors. The linea alba was identified. The linea alba was incised with a 15 blade knife and each side was grasped with Coker clamps. The preperitoneal space was then probed with a hemostat until the peritoneum was opened and access was gained to the abdominal cavity. A 0 Vicryl pursestring stitch was placed in the fascia surrounding the opening. A Hassan cannula was then placed through the opening and anchored in place with the previously placed Vicryl purse string stitch. The abdomen was insufflated with carbon dioxide without difficulty. A laparoscope was inserted through the [facility] cannula in the right upper quadrant was inspected. Next the epigastric region was infiltrated with % Marcaine . A small incision  was made with a 15 blade knife. A 5 mm port was placed bluntly through this incision into the abdominal cavity under direct vision. Next 2 sites were chosen laterally on the right side of the abdomen for placement of 5 mm ports. Each of these areas was infiltrated with quarter percent Marcaine . Small stab incisions were made with a 15 blade knife. 5 mm ports were then placed bluntly through these incisions into the abdominal cavity under direct vision without difficulty. A blunt grasper was placed through the lateralmost 5 mm port and used to grasp the dome of the gallbladder and elevate it anteriorly and superiorly. Another blunt grasper was placed through the other 5 mm port and used to retract the body and neck of the gallbladder. A dissector was placed through the epigastric port and using the electrocautery the peritoneal reflection at the gallbladder neck was opened. Blunt dissection was then carried out in this area until the gallbladder neck-cystic duct junction was readily identified and a good critical window was created. A single clip was placed on the gallbladder neck. A small  ductotomy was made just below the clip with laparoscopic scissors. A 14-gauge Angiocath was then placed through the anterior abdominal wall under direct vision. A Reddick cholangiogram catheter was then placed through the Angiocath and flushed.  The cystic duct lumen was too small to except the catheter.  3 clips were placed proximally on the cystic duct and 1 distally and the duct was divided between the 2 sets of clips. Posterior to this the cystic artery was identified and again dissected bluntly in a circumferential manner  until a good window  was created. 2 clips were placed proximally and one distally on the artery and the artery was divided between the 2 sets of clips. Next a laparoscopic hook cautery device was used to separate the gallbladder from the liver bed. Prior to completely detaching the gallbladder from the liver  bed the liver bed was inspected and several small bleeding points were coagulated with the electrocautery until the area was completely hemostatic. The gallbladder was then detached the rest of it from the liver bed without difficulty. A laparoscopic bag was inserted through the hassan port. The laparoscope was moved to the epigastric port. The gallbladder was placed within the bag and the bag was sealed.  The bag with the gallbladder was then removed with the [facility] cannula through the infraumbilical port without difficulty. The fascial defect was then closed with the previously placed Vicryl pursestring stitch as well as with another figure-of-eight 0 Vicryl stitch. The liver bed was inspected again and found to be hemostatic. The abdomen was irrigated with copious amounts of saline until the effluent was clear. The ports were then removed under direct vision without difficulty and were found to be hemostatic. The gas was allowed to escape. No other abnormalities were noted on general inspection of the abdomen. The skin incisions were all closed with interrupted 4-0 Monocryl subcuticular stitches. Dermabond dressings were applied. The patient tolerated the procedure well. At the end of the case all needle sponge and instrument counts were correct. The patient was then awakened and taken to recovery in stable condition  PLAN OF CARE: Discharge to home after PACU  PATIENT DISPOSITION:  PACU - hemodynamically stable.   Delay start of Pharmacological VTE agent (>24hrs) due to surgical blood loss or risk of bleeding: not applicable

## 2024-04-07 NOTE — Interval H&P Note (Signed)
 History and Physical Interval Note:  04/07/2024 1:20 PM  Alexandra Escobar  has presented today for surgery, with the diagnosis of GALLSTONES.  The various methods of treatment have been discussed with the patient and family. After consideration of risks, benefits and other options for treatment, the patient has consented to  Procedure(s) with comments: LAPAROSCOPIC CHOLECYSTECTOMY WITH INTRAOPERATIVE CHOLANGIOGRAM (N/A) - POSSIBLE IOC as a surgical intervention.  The patient's history has been reviewed, patient examined, no change in status, stable for surgery.  I have reviewed the patient's chart and labs.  Questions were answered to the patient's satisfaction.     Deward Null III

## 2024-04-07 NOTE — Transfer of Care (Signed)
 Immediate Anesthesia Transfer of Care Note  Patient: Alexandra Escobar  Procedure(s) Performed: LAPAROSCOPIC CHOLECYSTECTOMY  Patient Location: PACU  Anesthesia Type:General  Level of Consciousness: awake  Airway & Oxygen Therapy: Patient Spontanous Breathing and Patient connected to nasal cannula oxygen  Post-op Assessment: Report given to RN and Post -op Vital signs reviewed and stable  Post vital signs: Reviewed and stable  Last Vitals:  Vitals Value Taken Time  BP 147/89 04/07/24 15:22  Temp    Pulse 82 04/07/24 15:26  Resp 22 04/07/24 15:26  SpO2 97 % 04/07/24 15:26  Vitals shown include unfiled device data.  Last Pain:  Vitals:   04/07/24 1206  TempSrc: Oral  PainSc: 0-No pain         Complications: No notable events documented.

## 2024-04-07 NOTE — H&P (Signed)
 REFERRING PHYSICIAN: May, Deanna Jean, NP PROVIDER: DEWARD GARNETTE NULL, MD MRN: [id number] DOB: [birth year] Subjective   Chief Complaint: New Consultation (cholelithiasis. )  History of Present Illness: Alexandra Escobar is a 43 y.o. female who is seen today as an office consultation for evaluation of New Consultation (cholelithiasis. )  We are asked to see the patient in consultation by Dr. Cathryne May to evaluate her for gallstones. The patient is a 43 year old female who for started experiencing some right sided abdominal discomfort about 2 to 3 weeks ago. She has been experiencing diarrhea for some time. She denies any significant nausea or vomiting. During her workup she did have an ultrasound which showed multiple stones in the gallbladder but no gallbladder wall thickening or ductal dilatation. She had 2 liver functions that were slightly elevated recently but have returned to normal. She is otherwise in good health and does not smoke.  Review of Systems: A complete review of systems was obtained from the patient. I have reviewed this information and discussed as appropriate with the patient. See HPI as well for other ROS.  ROS   Medical History: Past Medical History:  Diagnosis Date  Anxiety  Hyperlipidemia  Hypertension  Thyroid  disease   Patient Active Problem List  Diagnosis  Calculus of gallbladder without cholecystitis without obstruction   Past Surgical History:  Procedure Laterality Date  .C-SECTION    Allergies  Allergen Reactions  Tromethamine  Anxiety   Current Outpatient Medications on File Prior to Visit  Medication Sig Dispense Refill  levothyroxine  (SYNTHROID ) 50 MCG tablet Take 50 mcg by mouth every morning before breakfast  loratadine  (CLARITIN ) 10 mg tablet  mirtazapine  (REMERON ) 15 MG tablet TAKE 1 TABLET BY MOUTH EVERY NIGHT FOR SLEEP  PAXLOVID  300 mg (150 mg x 2)-100 mg tablet TK 2 NIRMATRELVIR  TS AND 1 RITONAVIR  T TOGETHER PO BID FOR 5 DAYS  propranoloL   (INDERAL ) 60 MG tablet  sertraline  (ZOLOFT ) 100 MG tablet  SUMAtriptan  (IMITREX ) 100 MG tablet  topiramate  (TOPAMAX ) 50 MG tablet  valsartan  (DIOVAN ) 40 MG tablet  amLODIPine  (NORVASC ) 5 MG tablet Take 5 mg by mouth once daily   No current facility-administered medications on file prior to visit.   Family History  Problem Relation Age of Onset  Hyperlipidemia (Elevated cholesterol) Father  High blood pressure (Hypertension) Father    Social History   Tobacco Use  Smoking Status Never  Smokeless Tobacco Never    Social History   Socioeconomic History  Marital status: Married  Tobacco Use  Smoking status: Never  Smokeless tobacco: Never  Vaping Use  Vaping status: Unknown  Substance and Sexual Activity  Alcohol use: Never  Drug use: Never   Social Drivers of Corporate investment banker Strain: Patient Declined (01/31/2024)  Received from [facility]  Overall Financial Resource Strain (CARDIA)  How hard is it for you to pay for the very basics like food, housing, medical care, and heating?: Patient declined  Food Insecurity: Patient Declined (01/31/2024)  Received from [facility]  Hunger Vital Sign  Within the past 12 months, you worried that your food would run out before you got the money to buy more.: Patient declined  Within the past 12 months, the food you bought just didn't last and you didn't have money to get more.: Patient declined  Transportation Needs: No Transportation Needs (01/31/2024)  Received from [facility] - Transportation  In the past 12 months, has lack of transportation kept you from medical appointments or from getting  medications?: No  In the past 12 months, has lack of transportation kept you from meetings, work, or from getting things needed for daily living?: No  Physical Activity: Inactive (01/31/2024)  Received from [facility]  Exercise Vital Sign  On average, how many days per week do you engage in moderate to strenuous exercise  (like a brisk walk)?: 0 days  Stress: Stress Concern Present (01/31/2024)  Received from [facility] - Occupational Stress Questionnaire  Do you feel stress - tense, restless, nervous, or anxious, or unable to sleep at night because your mind is troubled all the time - these days?: Very much  Social Connections: Socially Isolated (01/31/2024)  Received from [facility]  Social Connection and Isolation Panel  In a typical week, how many times do you talk on the phone with family, friends, or neighbors?: Once a week  How often do you get together with friends or relatives?: Once a week  How often do you attend church or religious services?: 1 to 4 times per year  Do you belong to any clubs or organizations such as church groups, unions, fraternal or athletic groups, or school groups?: No  Are you married, widowed, divorced, separated, never married, or living with a partner?: Separated   Objective:   Vitals:  BP: 122/84  Pulse: 76  Resp: 16  Temp: 36.9 C (98.5 F)  SpO2: 98%  Weight: 98 kg (216 lb)  Height: 154.9 cm (5' 1)  PainSc: 4  PainLoc: Abdomen   Body mass index is 40.81 kg/m.  Physical Exam Vitals reviewed.  Constitutional:  General: She is not in acute distress. Appearance: Normal appearance.  HENT:  Head: Normocephalic and atraumatic.  Right Ear: External ear normal.  Left Ear: External ear normal.  Nose: Nose normal.  Mouth/Throat:  Mouth: Mucous membranes are moist.  Pharynx: Oropharynx is clear.  Eyes:  General: No scleral icterus. Extraocular Movements: Extraocular movements intact.  Conjunctiva/sclera: Conjunctivae normal.  Pupils: Pupils are equal, round, and reactive to light.  Cardiovascular:  Rate and Rhythm: Normal rate and regular rhythm.  Pulses: Normal pulses.  Heart sounds: Normal heart sounds.  Pulmonary:  Effort: Pulmonary effort is normal. No respiratory distress.  Breath sounds: Normal breath  sounds.  Abdominal:  General: Bowel sounds are normal.  Palpations: Abdomen is soft.  Comments: There is some mild tenderness to palpation in the right upper quadrant. There is no palpable mass.  Musculoskeletal:  General: No swelling, tenderness or deformity. Normal range of motion.  Cervical back: Normal range of motion and neck supple.  Skin: General: Skin is warm and dry.  Coloration: Skin is not jaundiced.  Neurological:  General: No focal deficit present.  Mental Status: She is alert and oriented to person, place, and time.  Psychiatric:  Mood and Affect: Mood normal.  Behavior: Behavior normal.     Labs, Imaging and Diagnostic Testing:  Assessment and Plan:   Diagnoses and all orders for this visit:  Calculus of gallbladder without cholecystitis without obstruction   The patient appears to have symptomatic gallstones. Because of the risk of further painful episodes and possible pancreatitis I feel she would likely benefit from having the gallbladder removed. She would like to talk to her family about it before making any final decisions. I have discussed with her in detail the risks and benefits of the operation as well as some of the technical aspects including the risk of common bile duct injury and she understands.  She will let us  know how she would like to proceed and we can make all the arrangements.

## 2024-04-08 ENCOUNTER — Encounter (HOSPITAL_COMMUNITY): Payer: Self-pay | Admitting: General Surgery

## 2024-04-08 NOTE — Anesthesia Postprocedure Evaluation (Signed)
 Anesthesia Post Note  Patient: Alexandra Escobar  Procedure(s) Performed: LAPAROSCOPIC CHOLECYSTECTOMY     Patient location during evaluation: PACU Anesthesia Type: General Level of consciousness: awake Pain management: pain level controlled Vital Signs Assessment: post-procedure vital signs reviewed and stable Respiratory status: spontaneous breathing, nonlabored ventilation and respiratory function stable Cardiovascular status: blood pressure returned to baseline and stable Postop Assessment: no apparent nausea or vomiting Anesthetic complications: no   No notable events documented.  Last Vitals:  Vitals:   04/07/24 1745 04/07/24 1746  BP: (!) 143/90   Pulse: 78 77  Resp: 17 14  Temp:  36.6 C  SpO2: 94% 94%    Last Pain:  Vitals:   04/07/24 1730  TempSrc:   PainSc: 9    Pain Goal:                   Bernardino SQUIBB [name]

## 2024-04-09 LAB — SURGICAL PATHOLOGY

## 2024-04-13 ENCOUNTER — Other Ambulatory Visit: Payer: Self-pay | Admitting: Family Medicine

## 2024-04-13 DIAGNOSIS — E039 Hypothyroidism, unspecified: Secondary | ICD-10-CM

## 2024-05-18 ENCOUNTER — Encounter: Payer: Self-pay | Admitting: *Deleted

## 2024-05-20 ENCOUNTER — Ambulatory Visit: Attending: Cardiology | Admitting: Cardiology

## 2024-05-20 ENCOUNTER — Encounter: Payer: Self-pay | Admitting: Cardiology

## 2024-05-20 VITALS — BP 140/108 | HR 82 | Ht 62.0 in | Wt 210.0 lb

## 2024-05-20 DIAGNOSIS — I10 Essential (primary) hypertension: Secondary | ICD-10-CM | POA: Diagnosis present

## 2024-05-20 MED ORDER — VALSARTAN 40 MG PO TABS: 40.0000 mg | ORAL_TABLET | Freq: Every evening | ORAL | 3 refills | Status: AC

## 2024-05-20 NOTE — Patient Instructions (Signed)
 Medication Instructions:  Your physician has recommended you make the following change in your medication:  START: Valsartan  80 mg in the morning, 40 mg in the evening.  *If you need a refill on your cardiac medications before your next appointment, please call your pharmacy*   Follow-Up: At [facility], you and your health needs are our priority.  As part of our continuing mission to provide you with exceptional heart care, our providers are all part of one team.  This team includes your primary Cardiologist (physician) and Advanced Practice Providers or APPs (Physician Assistants and Nurse Practitioners) who all work together to provide you with the care you need, when you need it.  Your next appointment:   16 week(s)  Provider:   Kardie Tobb, DO

## 2024-05-20 NOTE — Progress Notes (Unsigned)
 Cardio-Obstetrics Clinic  Follow Up Note   Date:  05/25/2024   ID:  Alexandra Escobar, DOB [birth year], MRN [id number]  PCP:  Joshua Tiffancy CROME, MD   [facility] Providers Cardiologist:  Dub Huntsman, DO  Electrophysiologist:  None        Referring MD: Joshua Lafaye CROME, MD   Chief Complaint:  I am ok   History of Present Illness:    Alexandra Escobar is a 43 y.o. female [G3P0121] who returns for follow up of  chronic hypertension with superimposed preeclampsia with severe features at [redacted]w[redacted]d., underwent induction due to preeclampsia and status post c-section, hypothyroidism, IVF pregnancy.   She is here today with her baby girl. She offers no specific complaints. She shares that medication for migraines have started to improve her symptoms.   At her last visit with me we increased her valsartan  to 80 mg daily. Also added propanolol. Since her visit the propanolol has been discontinued.   Prior CV Studies Reviewed: The following studies were reviewed today: Echo   Past Medical History:  Diagnosis Date   Allergy Seasonal   Anal fissure 2009   Anemia    In the past   Anxiety    Chronic kidney disease    Fatty liver   Colon polyps 2019   Depression    Endometriosis    Fatty liver    GERD (gastroesophageal reflux disease)    Headache    Hypertension    Hypothyroidism    Obesity    PCOS (polycystic ovarian syndrome)    Thyroid  disease 2022    Past Surgical History:  Procedure Laterality Date   CESAREAN SECTION N/A 06/18/2023   Procedure: CESAREAN SECTION;  Surgeon: Curlene Agent, MD;  Location: [facility] ORS;  Service: Obstetrics;  Laterality: N/A;   CHOLECYSTECTOMY N/A 04/07/2024   Procedure: LAPAROSCOPIC CHOLECYSTECTOMY;  Surgeon: Curvin Deward MOULD, MD;  Location: [facility] OR;  Service: General;  Laterality: N/A;   COLON SURGERY     Fistula   DILATION AND CURETTAGE OF UTERUS  06/2021   HEMATOMA EVACUATION N/A 06/24/2023   Procedure: EVACUATION HEMATOMA INCISIONAL;  Surgeon:  Marne Kelly Nest, MD;  Location: [facility] OR;  Service: Gynecology;  Laterality: N/A;   LAPAROSCOPY     for endometriosis   NASAL SEPTUM SURGERY     OTHER SURGICAL HISTORY     fibroid removal   OTHER SURGICAL HISTORY     fistula drainage      OB History     Gravida  3   Para  1   Term      Preterm  1   AB  2   Living  1      SAB  2   IAB      Ectopic      Multiple  0   Live Births  1               Current Medications: Current Meds  Medication Sig   levothyroxine  (SYNTHROID ) 50 MCG tablet TAKE 1 TABLET BY MOUTH DAILY BEFORE BREAKFAST   MAGNESIUM  GLYCINATE PO Take 1,000 mg by mouth daily.   Rimegepant Sulfate (NURTEC) 75 MG TBDP Take 75 mg by mouth daily as needed (Migraine).   sertraline  (ZOLOFT ) 100 MG tablet Take 150 mg by mouth daily. (Patient taking differently: Take 200 mg by mouth daily.)   valsartan  (DIOVAN ) 40 MG tablet Take 1 tablet (40 mg total) by mouth at bedtime.   valsartan  (DIOVAN ) 80 MG tablet Take  1 tablet (80 mg total) by mouth daily.     Allergies:   Toradol  [ketorolac  tromethamine ]   Social History   Socioeconomic History   Marital status: Married    Spouse name: Not on file   Number of children: Not on file   Years of education: Not on file   Highest education level: Some college, no degree  Occupational History   Not on file  Tobacco Use   Smoking status: Never    Passive exposure: Never   Smokeless tobacco: Never  Vaping Use   Vaping status: Never Used  Substance and Sexual Activity   Alcohol use: Never   Drug use: Never   Sexual activity: Not Currently    Partners: Male    Birth control/protection: None  Other Topics Concern   Not on file  Social History Narrative   Right handed   Caffeine -1 cup    Works from home, 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, computer work   Social Drivers of Corporate Investment Banker Strain: Patient Declined (01/31/2024)   Overall Financial Resource Strain (CARDIA)    Difficulty of Paying Living  Expenses: Patient declined  Food Insecurity: Patient Declined (01/31/2024)   Hunger Vital Sign    Worried About Running Out of Food in the Last Year: Patient declined    Ran Out of Food in the Last Year: Patient declined  Transportation Needs: No Transportation Needs (01/31/2024)   PRAPARE - Administrator, Civil Service (Medical): No    Lack of Transportation (Non-Medical): No  Physical Activity: Inactive (01/31/2024)   Exercise Vital Sign    Days of Exercise per Week: 0 days    Minutes of Exercise per Session: Not on file  Stress: Stress Concern Present (01/31/2024)   [employer] of Occupational Health - Occupational Stress Questionnaire    Feeling of Stress: Very much  Social Connections: Socially Isolated (01/31/2024)   Social Connection and Isolation Panel    Frequency of Communication with Friends and Family: Once a week    Frequency of Social Gatherings with Friends and Family: Once a week    Attends Religious Services: 1 to 4 times per year    Active Member of [employer] or Organizations: No    Attends Engineer, Structural: Not on file    Marital Status: Separated      Family History  Problem Relation Age of Onset   Hypertension Father    Depression Father    Anxiety disorder Father    Hypertension Sister    Stroke Maternal Grandmother    Cancer Maternal Grandmother        breast   Stroke Maternal Grandfather    Hypertension Paternal Grandmother       ROS:   Please see the history of present illness.     All other systems reviewed and are negative.   Labs/EKG Reviewed:    EKG:   EKG was no  ordered today.    Recent Labs: 06/04/2023: B Natriuretic Peptide 52.4 06/19/2023: Magnesium  3.9 01/11/2024: TSH 1.06 04/07/2024: ALT 37; BUN 8; Creatinine, Ser 0.58; Hemoglobin 14.6; Platelets 286; Potassium 4.0; Sodium 139   Recent Lipid Panel No results found for: CHOL, TRIG, HDL, CHOLHDL, LDLCALC, LDLDIRECT  Physical Exam:    VS:   BP (!) 140/108 (BP Location: Left Arm, Patient Position: Sitting, Cuff Size: Large)   Pulse 82   Ht 5' 2 (1.575 m)   Wt 210 lb (95.3 kg)   SpO2 98%   BMI 38.41 kg/m  Wt Readings from Last 3 Encounters:  05/20/24 210 lb (95.3 kg)  04/07/24 214 lb 15.2 oz (97.5 kg)  03/11/24 215 lb (97.5 kg)     GEN:  Well nourished, well developed in no acute distress HEENT: Normal NECK: No JVD; No carotid bruits LYMPHATICS: No lymphadenopathy CARDIAC: RRR, no murmurs, rubs, gallops RESPIRATORY:  Clear to auscultation without rales, wheezing or rhonchi  ABDOMEN: Soft, non-tender, non-distended MUSCULOSKELETAL:  No edema; No deformity  SKIN: Warm and dry NEUROLOGIC:  Alert and oriented x 3 PSYCHIATRIC:  Normal affect    Risk Assessment/Risk Calculators:                 ASSESSMENT & PLAN:    Chronic Hypertension - blood pressure is elevated, will continue valsartan  80 mg in the morning but will add valsartan  40 mg at night time. Hope this dose will help improve her blood pressure. Target blood pressure is <130/90 mmHg.   The patient understands the need to lose weight with diet and exercise. We have discussed specific strategies for this.  Patient Instructions  Medication Instructions:  Your physician has recommended you make the following change in your medication:  START: Valsartan  80 mg in the morning, 40 mg in the evening.  *If you need a refill on your cardiac medications before your next appointment, please call your pharmacy*   Follow-Up: At [facility], you and your health needs are our priority.  As part of our continuing mission to provide you with exceptional heart care, our providers are all part of one team.  This team includes your primary Cardiologist (physician) and Advanced Practice Providers or APPs (Physician Assistants and Nurse Practitioners) who all work together to provide you with the care you need, when you need it.  Your next appointment:   16  week(s)  Provider:   Apostolos Blagg, DO      Dispo:  No follow-ups on file.   Medication Adjustments/Labs and Tests Ordered: Current medicines are reviewed at length with the patient today.  Concerns regarding medicines are outlined above.  Tests Ordered: No orders of the defined types were placed in this encounter.  Medication Changes: Meds ordered this encounter  Medications   valsartan  (DIOVAN ) 40 MG tablet    Sig: Take 1 tablet (40 mg total) by mouth at bedtime.    Dispense:  90 tablet    Refill:  3

## 2024-06-07 ENCOUNTER — Ambulatory Visit: Admitting: Neurology

## 2024-08-25 ENCOUNTER — Ambulatory Visit: Admitting: Adult Health

## 2024-08-25 ENCOUNTER — Encounter: Payer: Self-pay | Admitting: Adult Health

## 2024-09-02 ENCOUNTER — Ambulatory Visit: Admitting: Cardiology

## 2024-11-15 ENCOUNTER — Ambulatory Visit: Admitting: Neurology

## 2025-01-30 ENCOUNTER — Encounter: Admitting: Internal Medicine
